# Patient Record
Sex: Male | Born: 1950 | Race: White | Hispanic: No | Marital: Married | Smoking: Former smoker
Health system: Southern US, Community
[De-identification: ages and names within clinical notes are randomized; demographics above are authoritative.]

## PROBLEM LIST (undated history)

## (undated) DIAGNOSIS — I10 Essential (primary) hypertension: Secondary | ICD-10-CM

## (undated) DIAGNOSIS — M1990 Unspecified osteoarthritis, unspecified site: Secondary | ICD-10-CM

## (undated) DIAGNOSIS — M109 Gout, unspecified: Secondary | ICD-10-CM

## (undated) DIAGNOSIS — E785 Hyperlipidemia, unspecified: Secondary | ICD-10-CM

## (undated) DIAGNOSIS — N189 Chronic kidney disease, unspecified: Secondary | ICD-10-CM

## (undated) DIAGNOSIS — E119 Type 2 diabetes mellitus without complications: Secondary | ICD-10-CM

## (undated) DIAGNOSIS — K439 Ventral hernia without obstruction or gangrene: Secondary | ICD-10-CM

## (undated) DIAGNOSIS — I509 Heart failure, unspecified: Secondary | ICD-10-CM

## (undated) DIAGNOSIS — G4730 Sleep apnea, unspecified: Secondary | ICD-10-CM

## (undated) DIAGNOSIS — I499 Cardiac arrhythmia, unspecified: Secondary | ICD-10-CM

## (undated) HISTORY — PX: CATARACT EXTRACTION W/ INTRAOCULAR LENS IMPLANT: SHX1309

## (undated) HISTORY — DX: Type 2 diabetes mellitus without complications: E11.9

## (undated) HISTORY — DX: Gout, unspecified: M10.9

## (undated) HISTORY — DX: Essential (primary) hypertension: I10

## (undated) HISTORY — DX: Ventral hernia without obstruction or gangrene: K43.9

## (undated) HISTORY — DX: Hyperlipidemia, unspecified: E78.5

## (undated) HISTORY — PX: HIP SURGERY: SHX245

## (undated) HISTORY — DX: Unspecified osteoarthritis, unspecified site: M19.90

---

## 1997-07-25 ENCOUNTER — Encounter: Admission: RE | Admit: 1997-07-25 | Discharge: 1997-07-25 | Payer: Self-pay | Admitting: *Deleted

## 2008-02-23 HISTORY — PX: KNEE ARTHROSCOPY: SHX127

## 2008-07-31 ENCOUNTER — Encounter: Admission: RE | Admit: 2008-07-31 | Discharge: 2008-07-31 | Payer: Self-pay | Admitting: Sports Medicine

## 2008-08-12 ENCOUNTER — Encounter: Admission: RE | Admit: 2008-08-12 | Discharge: 2008-08-12 | Payer: Self-pay | Admitting: Sports Medicine

## 2008-09-03 ENCOUNTER — Encounter: Admission: RE | Admit: 2008-09-03 | Discharge: 2008-10-22 | Payer: Self-pay | Admitting: Specialist

## 2009-02-22 HISTORY — PX: INGUINAL HERNIA REPAIR: SUR1180

## 2010-03-16 ENCOUNTER — Encounter: Payer: Self-pay | Admitting: Sports Medicine

## 2010-07-13 ENCOUNTER — Emergency Department (HOSPITAL_COMMUNITY)
Admission: EM | Admit: 2010-07-13 | Discharge: 2010-07-13 | Disposition: A | Discharge status: Home / Self Care | Payer: BC Managed Care – PPO | Attending: Emergency Medicine | Admitting: Emergency Medicine

## 2010-07-13 ENCOUNTER — Ambulatory Visit
Admission: RE | Admit: 2010-07-13 | Discharge: 2010-07-13 | Disposition: A | Discharge status: Home / Self Care | Payer: BC Managed Care – PPO | Source: Ambulatory Visit | Attending: Family Medicine | Admitting: Family Medicine

## 2010-07-13 ENCOUNTER — Other Ambulatory Visit: Payer: Self-pay | Admitting: Family Medicine

## 2010-07-13 DIAGNOSIS — I10 Essential (primary) hypertension: Secondary | ICD-10-CM | POA: Insufficient documentation

## 2010-07-13 DIAGNOSIS — R61 Generalized hyperhidrosis: Secondary | ICD-10-CM

## 2010-07-13 DIAGNOSIS — R109 Unspecified abdominal pain: Secondary | ICD-10-CM | POA: Insufficient documentation

## 2010-07-13 DIAGNOSIS — R110 Nausea: Secondary | ICD-10-CM

## 2010-07-13 DIAGNOSIS — N289 Disorder of kidney and ureter, unspecified: Secondary | ICD-10-CM | POA: Insufficient documentation

## 2010-07-13 DIAGNOSIS — R1031 Right lower quadrant pain: Secondary | ICD-10-CM

## 2010-07-13 DIAGNOSIS — N201 Calculus of ureter: Secondary | ICD-10-CM | POA: Insufficient documentation

## 2010-07-13 LAB — POCT I-STAT, CHEM 8
BUN: 29 mg/dL — ABNORMAL HIGH (ref 6–23)
Creatinine, Ser: 2.3 mg/dL — ABNORMAL HIGH (ref 0.4–1.5)
Hemoglobin: 15.6 g/dL (ref 13.0–17.0)
Potassium: 4.1 mEq/L (ref 3.5–5.1)
Sodium: 135 mEq/L (ref 135–145)

## 2010-07-13 LAB — URINALYSIS, ROUTINE W REFLEX MICROSCOPIC
Bilirubin Urine: NEGATIVE
Glucose, UA: NEGATIVE mg/dL
Ketones, ur: NEGATIVE mg/dL
Protein, ur: NEGATIVE mg/dL

## 2010-07-13 LAB — URINE MICROSCOPIC-ADD ON

## 2010-07-13 MED ORDER — IOHEXOL 300 MG/ML  SOLN
125.0000 mL | Freq: Once | INTRAMUSCULAR | Status: AC | PRN
  Administered 2010-07-13: 125 mL via INTRAVENOUS

## 2010-11-20 ENCOUNTER — Encounter: Payer: Self-pay | Admitting: Gastroenterology

## 2010-12-21 ENCOUNTER — Other Ambulatory Visit: Payer: BC Managed Care – PPO | Admitting: Internal Medicine

## 2010-12-24 ENCOUNTER — Other Ambulatory Visit: Payer: BC Managed Care – PPO | Admitting: Gastroenterology

## 2010-12-24 DIAGNOSIS — D126 Benign neoplasm of colon, unspecified: Secondary | ICD-10-CM

## 2010-12-24 HISTORY — DX: Benign neoplasm of colon, unspecified: D12.6

## 2010-12-25 ENCOUNTER — Ambulatory Visit (AMBULATORY_SURGERY_CENTER): Payer: BC Managed Care – PPO | Admitting: *Deleted

## 2010-12-25 ENCOUNTER — Other Ambulatory Visit: Payer: BC Managed Care – PPO | Admitting: Gastroenterology

## 2010-12-25 VITALS — Ht 72.0 in | Wt 255.0 lb

## 2010-12-25 DIAGNOSIS — Z1211 Encounter for screening for malignant neoplasm of colon: Secondary | ICD-10-CM

## 2010-12-25 MED ORDER — PEG-KCL-NACL-NASULF-NA ASC-C 100 G PO SOLR: ORAL | Status: DC

## 2011-01-08 ENCOUNTER — Ambulatory Visit (AMBULATORY_SURGERY_CENTER): Payer: BC Managed Care – PPO | Admitting: Gastroenterology

## 2011-01-08 ENCOUNTER — Encounter: Payer: Self-pay | Admitting: Gastroenterology

## 2011-01-08 DIAGNOSIS — D126 Benign neoplasm of colon, unspecified: Secondary | ICD-10-CM

## 2011-01-08 DIAGNOSIS — Z1211 Encounter for screening for malignant neoplasm of colon: Secondary | ICD-10-CM

## 2011-01-08 MED ORDER — SODIUM CHLORIDE 0.9 % IV SOLN: 500.0000 mL | INTRAVENOUS | Status: DC

## 2011-01-08 NOTE — Patient Instructions (Signed)
Please refer to your blue and neon green sheets for instructions regarding diet and activity for the rest of today.  You may resume your medications as you would normally take them.   You will receive a letter in the mail in about two weeks regarding the results of the biopsies taken today.  Hemorrhoids Hemorrhoids are veins in the rectum that get big. These veins can get blocked. Blocked veins become puffy (swollen) and painful. HOME CARE  Eat more fiber.   Drink enough fluid to keep your pee (urine) clear or pale yellow.   Exercise often.   Avoid straining to poop (bowel movement).   Keep the butt area dry and clean.   Only take medicine as told by your doctor.  If your hemorrhoids are puffy and painful:  Take a warm bath for 20 to 30 minutes. Do this 3 to 4 times a day.   Place ice packs on the area. Use the ice packs between the baths.   Put ice in a plastic bag.   Place a towel between your skin and the bag.   Leave the ice on for 15 to 20 minutes, 3 to 4 times a day.   Do not use a donut-shaped pillow. Do not sit on the toilet for a long time.   Go to the bathroom when your body has the urge to poop. This is so you do not strain as much to poop.  GET HELP RIGHT AWAY IF:   You have increasing pain that is not controlled with medicine.   You have uncontrolled bleeding.   You cannot poop.   You have pain or puffiness outside the area of the hemorrhoids.   You have chills.   You have a temperature by mouth above 102 F (38.9 C), not controlled by medicine.  MAKE SURE YOU:   Understand these instructions.   Will watch your condition.   Will get help right away if you are not doing well or get worse.  Document Released: 11/18/2007 Document Revised: 10/21/2010 Document Reviewed: 11/18/2007 [facility] Patient Information 2012 [place].   Colon Polyps A polyp is extra tissue that grows inside your body. Colon polyps grow in the large intestine. The  large intestine, also called the colon, is part of your digestive system. It is a long, hollow tube at the end of your digestive tract where your body makes and stores stool. Most polyps are not dangerous. They are benign. This means they are not cancerous. But over time, some types of polyps can turn into cancer. Polyps that are smaller than a pea are usually not harmful. But larger polyps could someday become or may already be cancerous. To be safe, doctors remove all polyps and test them.  WHO GETS POLYPS? Anyone can get polyps, but certain people are more likely than others. You may have a greater chance of getting polyps if:  You are over 50.   You have had polyps before.   Someone in your family has had polyps.   Someone in your family has had cancer of the large intestine.   Find out if someone in your family has had polyps. You may also be more likely to get polyps if you:   Eat a lot of fatty foods.   Smoke.   Drink alcohol.   Do not exercise.   Eat too much.  SYMPTOMS  Most small polyps do not cause symptoms. People often do not know they have one until their caregiver  finds it during a regular checkup or while testing them for something else. Some people do have symptoms like these:  Bleeding from the anus. You might notice blood on your underwear or on toilet paper after you have had a bowel movement.   Constipation or diarrhea that lasts more than a week.   Blood in the stool. Blood can make stool look black or it can show up as red streaks in the stool.  If you have any of these symptoms, see your caregiver. HOW DOES THE DOCTOR TEST FOR POLYPS? The doctor can use four tests to check for polyps:  Digital rectal exam. The caregiver wears gloves and checks your rectum (the last part of the large intestine) to see if it feels normal. This test would find polyps only in the rectum. Your caregiver may need to do one of the other tests listed below to find polyps higher up  in the intestine.   Barium enema. The caregiver puts a liquid called barium into your rectum before taking x-rays of your large intestine. Barium makes your intestine look white in the pictures. Polyps are dark, so they are easy to see.   Sigmoidoscopy. With this test, the caregiver can see inside your large intestine. A thin flexible tube is placed into your rectum. The device is called a sigmoidoscope, which has a light and a tiny video camera in it. The caregiver uses the sigmoidoscope to look at the last third of your large intestine.   Colonoscopy. This test is like sigmoidoscopy, but the caregiver looks at all of the large intestine. It usually requires sedation. This is the most common method for finding and removing polyps.  TREATMENT   The caregiver will remove the polyp during sigmoidoscopy or colonoscopy. The polyp is then tested for cancer.   If you have had polyps, your caregiver may want you to get tested regularly in the future.  PREVENTION  There is not one sure way to prevent polyps. You might be able to lower your risk of getting them if you:  Eat more fruits and vegetables and less fatty food.   Do not smoke.   Avoid alcohol.   Exercise every day.   Lose weight if you are overweight.   Eating more calcium and folate can also lower your risk of getting polyps. Some foods that are rich in calcium are milk, cheese, and broccoli. Some foods that are rich in folate are chickpeas, kidney beans, and spinach.   Aspirin might help prevent polyps. Studies are under way.  Document Released: 11/05/2003 Document Revised: 10/21/2010 Document Reviewed: 04/12/2007 [facility] Patient Information 2012 [place].

## 2011-01-11 ENCOUNTER — Telehealth: Payer: Self-pay | Admitting: *Deleted

## 2011-01-11 NOTE — Telephone Encounter (Signed)

## 2011-01-12 ENCOUNTER — Encounter: Payer: Self-pay | Admitting: Gastroenterology

## 2014-08-02 DIAGNOSIS — I10 Essential (primary) hypertension: Secondary | ICD-10-CM | POA: Insufficient documentation

## 2014-08-02 DIAGNOSIS — G4733 Obstructive sleep apnea (adult) (pediatric): Secondary | ICD-10-CM | POA: Diagnosis present

## 2014-08-02 DIAGNOSIS — E119 Type 2 diabetes mellitus without complications: Secondary | ICD-10-CM | POA: Insufficient documentation

## 2014-08-02 DIAGNOSIS — I4891 Unspecified atrial fibrillation: Secondary | ICD-10-CM | POA: Insufficient documentation

## 2014-08-02 DIAGNOSIS — E785 Hyperlipidemia, unspecified: Secondary | ICD-10-CM | POA: Insufficient documentation

## 2015-12-12 ENCOUNTER — Encounter: Payer: Self-pay | Admitting: Gastroenterology

## 2018-11-09 ENCOUNTER — Ambulatory Visit (INDEPENDENT_AMBULATORY_CARE_PROVIDER_SITE_OTHER): Payer: Commercial Managed Care - PPO | Admitting: Cardiology

## 2018-11-09 ENCOUNTER — Encounter: Payer: Self-pay | Admitting: Cardiology

## 2018-11-09 ENCOUNTER — Other Ambulatory Visit: Payer: Self-pay

## 2018-11-09 VITALS — BP 121/68 | HR 62 | Temp 97.5°F | Ht 69.0 in | Wt 243.0 lb

## 2018-11-09 DIAGNOSIS — E1122 Type 2 diabetes mellitus with diabetic chronic kidney disease: Secondary | ICD-10-CM | POA: Diagnosis not present

## 2018-11-09 DIAGNOSIS — Z8679 Personal history of other diseases of the circulatory system: Secondary | ICD-10-CM | POA: Diagnosis not present

## 2018-11-09 DIAGNOSIS — Z6835 Body mass index (BMI) 35.0-35.9, adult: Secondary | ICD-10-CM

## 2018-11-09 DIAGNOSIS — R9431 Abnormal electrocardiogram [ECG] [EKG]: Secondary | ICD-10-CM | POA: Diagnosis not present

## 2018-11-09 DIAGNOSIS — E782 Mixed hyperlipidemia: Secondary | ICD-10-CM

## 2018-11-09 DIAGNOSIS — R0989 Other specified symptoms and signs involving the circulatory and respiratory systems: Secondary | ICD-10-CM

## 2018-11-09 DIAGNOSIS — N183 Chronic kidney disease, stage 3 (moderate): Secondary | ICD-10-CM

## 2018-11-09 DIAGNOSIS — I10 Essential (primary) hypertension: Secondary | ICD-10-CM

## 2018-11-09 NOTE — Progress Notes (Signed)
Primary Physician/Referring:  Christain Sacramento, MD  Patient ID: LEW PROUT, male    DOB: 04-30-1950, 68 y.o.   MRN: [id number]  Chief Complaint  Patient presents with  . Atrial Fibrillation  . New Patient (Initial Visit)   HPI:    Dale [name]  is a 68 y.o. 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 Donnetta Hutching in [facility].    Patient was first diagnosed with atrial fibrillation on 11/23/2012.  He has not had any recurrence of atrial fibrillation since then and has not been on anticoagulation.  Was evaluated and started on CPAP and since then has not had recurrence of atrial fib.  Except for "arthritis" he denies any other specific symptoms of dyspnea, chest pain or palpitation.  States that he did have palpitations in 2014 when he was diagnosed with atrial fibrillation.  He admits to being markedly sedentary.  Past Medical History:  Diagnosis Date  . Arthritis   . Diabetes mellitus without complication ([place])   . Epigastric hernia   . Hyperlipidemia   . Hypertension    Past Surgical History:  Procedure Laterality Date  . CATARACT EXTRACTION W/ INTRAOCULAR LENS IMPLANT     right  . INGUINAL HERNIA REPAIR  2011   left  . KNEE ARTHROSCOPY  2010   left   Social History   Socioeconomic History  . Marital status: Married    Spouse name: Not on file  . Number of children: 2  . Years of education: Not on file  . Highest education level: Not on file  Occupational History  . Not on file  Social Needs  . Financial resource strain: Not on file  . Food insecurity    Worry: Not on file    Inability: Not on file  . Transportation needs    Medical: Not on file    Non-medical: Not on file  Tobacco Use  . Smoking status: Former Smoker     Years: 10.00    Types: Cigars, Cigarettes    Quit date: 2000    Years since quitting: 20.7  . Smokeless tobacco: Former Systems developer    Types: Chew    Quit date: 2000  . Tobacco comment: quite 1979 cigarettes  Substance and Sexual Activity  . Alcohol use: No  . Drug use: No  . Sexual activity: Not on file  Lifestyle  . Physical activity    Days per week: Not on file    Minutes per session: Not on file  . Stress: Not on file  Relationships  . Social Herbalist on phone: Not on file    Gets together: Not on file    Attends religious service: Not on file    Active member of club or organization: Not on file    Attends meetings of clubs or organizations: Not on file    Relationship status: Not on file  . Intimate partner violence    Fear of current or ex partner: Not on file    Emotionally abused: Not on file    Physically abused: Not on file    Forced sexual activity: Not on file  Other Topics Concern  . Not on file  Social History Narrative  . Not on file   ROS  Review of Systems  Constitution: Negative for chills, decreased appetite, malaise/fatigue and weight gain.  Cardiovascular: Negative for dyspnea on exertion, leg swelling and syncope.  Respiratory: Positive for snoring (on CPAP and compliant).   Endocrine: Negative for cold intolerance.  Hematologic/Lymphatic: Does not bruise/bleed easily.  Musculoskeletal: Positive for joint pain (hands and knee). Negative for joint swelling.  Gastrointestinal: Negative for abdominal pain, anorexia, change in bowel habit, hematochezia and melena.  Neurological: Negative for headaches and light-headedness.  Psychiatric/Behavioral: Negative for depression and substance abuse.  All other systems reviewed and are negative.  Objective   Vitals with BMI 11/09/2018 01/08/2011 12/25/2010  Height 5' 9"  6' 0"  6' 0"   Weight 243 lbs 255 lbs 255 lbs  BMI 35.87 92.3 30.0  Systolic 762 263 -  Diastolic 68 60 -  Pulse 62 67 -     Blood pressure 121/68, pulse 62, temperature (!) 97.5 F (36.4 C), height 5' 9"  (1.753 m), weight 243 lb (110.2 kg), SpO2 95 %. Body mass index is 35.88 kg/m.   Physical Exam  Constitutional:  Well built and moderately obese in no acute distress  HENT:  Head: Atraumatic.  Eyes: Conjunctivae are normal.  Neck: Neck supple. No JVD present. No thyromegaly present.  Cardiovascular: Normal rate, regular rhythm, normal heart sounds and intact distal pulses. Exam reveals no gallop.  No murmur heard. Pulmonary/Chest: Effort normal and breath sounds normal.  Abdominal: Soft. Bowel sounds are normal.  Obese abdomen  Musculoskeletal: Normal range of motion.  Neurological: He is alert.  Skin: Skin is warm and dry.  Psychiatric: He has a normal mood and affect.   Radiology: No results found.  Laboratory examination:    Hemoglobin A1CResulted: 10/10/2018 4:01 PM [facility] Component Name Value Ref Range  HEMOGLOBIN A1C 7.1 (H)     Comprehensive Metabolic PanelResulted: 3/35/4562 2:02 PM [facility] Component Name Value Ref Range  Sodium 139 135 - 146 MMOL/L  Potassium 4.2 3.5 - 5.3 MMOL/L  Chloride 108 98 - 110 MMOL/L  CO2 21 (L) 23 - 30 MMOL/L  BUN 29 (H) 8 - 24 MG/DL  Glucose 141 (H) 70 - 99 MG/DL  Creatinine 1.93 (H) 0.5 - 1.5 MG/DL  Calcium 9.3 8.5 - 10.5 MG/DL  Total Protein 7.4 6 - 8.3 G/DL  Albumin  4.0 3.5 - 5 G/DL  Total Bilirubin 0.8 0.1 - 1.2 MG/DL  Alkaline Phosphatase 92 25 - 125 IU/L or U/L  AST (SGOT) 40 5 - 40 IU/L or U/L  ALT (SGPT) 28 5 - 50 IU/L or U/L  Anion Gap 10 4 - 14 MMOL/L  Est. GFR Non-African American 35 (L)    Lipid ProfileResulted: 10/10/2018 2:02 PM [facility] Component Name Value Ref Range  LDL Direct 45 <130 mg/dL  Total Cholesterol 164 25 - 199 MG/DL  Triglycerides 687 (H) 10 - 150 MG/DL  HDL Cholesterol 18 (L) 35 - 135 MG/DL  Total Chol / HDL Cholesterol 9.1 (H) <4.5    Non-HDL Cholesterol 146  Comment:  TARGET: <(LDL-C TARGET + 30)MG/DL MG/DL   RBC 3.67 (L) 4.70 - 6.10 x 10*6/uL [facility]   Hemoglobin 12.2 (L) 14.0 - 18.0 G/DL [facility]   Hematocrit 36.5 (L) 42.0 - 52.0 % [facility]   MCV 99.5 (H) 80.0 - 94.0 FL [facility]   [facility]  33.1 (H) 27.0 - 31.0 PG [facility]   MCHC 33.3 33.0 - 37.0 G/DL [facility]   RDW 15.3 (H) 11.5 - 14.5 % [facility]   Platelets 76 (L) 160 - 360 X 10*3/uL     No results for input(s): NA, K, CL, CO2, GLUCOSE, BUN, CREATININE, CALCIUM, GFRNONAA, GFRAA in the last 8760 hours. CMP Latest Ref Rng & Units 07/13/2010  Glucose 70 - 99 mg/dL 117(H)  BUN 6 - 23 mg/dL 29(H)  Creatinine 0.4 - 1.5 mg/dL 2.30(H)  Sodium 135 - 145 mEq/L 135  Potassium 3.5 - 5.1 mEq/L 4.1  Chloride 96 - 112 mEq/L 103   CBC Latest Ref Rng & Units 07/13/2010  Hemoglobin 13.0 - 17.0 g/dL 15.6  Hematocrit 39.0 - 52.0 % 46.0   Lipid Panel  No results found for: CHOL, TRIG, HDL, CHOLHDL, VLDL, LDLCALC, LDLDIRECT HEMOGLOBIN A1C No results found for: HGBA1C, MPG TSH No results for input(s): TSH in the last 8760 hours. Medications   Prior to Admission medications   Medication Sig Start Date End Date Taking? Authorizing Provider  acetaminophen (TYLENOL) 500 MG tablet Take 500 mg by mouth every 12 (twelve) hours as needed.   Yes [provider]  aspirin 325 MG tablet Take 325 mg by mouth daily.     Yes [provider]  atenolol (TENORMIN) 50 MG tablet Take 50 mg by mouth daily.   Yes [provider]  atorvastatin (LIPITOR) 80 MG tablet Take 1 tablet by mouth daily. 10/12/18  Yes [provider]  colchicine-probenecid 0.5-500 MG per  tablet Take 1 tablet by mouth daily.     Yes [provider]  fenofibrate (TRICOR) 145 MG tablet Take 145 mg by mouth daily.     Yes [provider]  flecainide (TAMBOCOR) 50 MG tablet Take 2 tablets by mouth daily. 10/04/16  Yes [provider]  glimepiride (AMARYL) 2 MG tablet Take 1 tablet by mouth daily. 10/12/18  Yes [provider]  metFORMIN (GLUCOPHAGE-XR) 500 MG 24 hr tablet Take 2 tablets by mouth daily. 10/12/18  Yes [provider]  quinapril-hydrochlorothiazide (ACCURETIC) 20-12.5 MG per tablet Take 1 tablet by mouth daily.     Yes [provider]     Current Outpatient Medications  Medication Instructions  . acetaminophen (TYLENOL) 500 mg, Oral, Every 12 hours PRN  . aspirin 325 mg, Daily  . atenolol (TENORMIN) 50 mg, Oral, Daily  . atorvastatin (LIPITOR) 80 MG tablet 1 tablet, Oral, Daily  . colchicine-probenecid 0.5-500 MG per tablet 1 tablet, Daily  . fenofibrate (TRICOR) 145 mg, Daily  . flecainide (TAMBOCOR) 50 MG tablet 2 tablets, Oral, Daily  . glimepiride (AMARYL) 2 MG tablet 1 tablet, Oral, Daily  . metFORMIN (GLUCOPHAGE-XR) 500 MG 24 hr tablet 2 tablets, Oral, Daily  . quinapril-hydrochlorothiazide (ACCURETIC) 20-12.5 MG per tablet 1 tablet, Daily    Cardiac Studies:   None  Assessment     ICD-10-CM   1. Nonspecific abnormal electrocardiogram (ECG) (EKG)  R94.31 PCV ECHOCARDIOGRAM COMPLETE    PCV MYOCARDIAL PERFUSION WO LEXISCAN  2. History of atrial fibrillation  Z86.79 EKG 12-Lead    PCV ECHOCARDIOGRAM COMPLETE    PCV MYOCARDIAL PERFUSION WO LEXISCAN   CHA2DS2-VASc Score is 3.  Yearly risk of stroke: 3.2%.  (Age, HTN, DM).   3. Controlled type 2 diabetes mellitus with stage 3 chronic kidney disease, without long-term current  use of insulin (HCC)  E11.22    N18.3   4. Essential hypertension  I10 PCV ECHOCARDIOGRAM COMPLETE    PCV MYOCARDIAL PERFUSION WO LEXISCAN  5. Mixed hyperlipidemia  E78.2   6.  Right carotid bruit  R09.89 PCV CAROTID DUPLEX (BILATERAL)  7. Class 2 severe obesity due to excess calories with serious comorbidity and body mass index (BMI) of 35.0 to 35.9 in adult [facility])  E66.01    Z68.35     EKG 11/09/2018: Normal sinus rhythm at the rate of 63 bpm, left atrial enlargement, left axis deviation, left anterior fascicular block.  Poor R wave progression, cannot exclude anterior lateral infarct old, pulmonary disease pattern.  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  LVH with repolarization abnormality.  Abnormal EKG.  Recommendations:   Patient referred by Dr. Kathryne Eriksson for evaluation of atrial fibrillation, hypertension, hyperlipidemia and abnormal EKG.  Patient diagnosed with atrial fibrillation by Dr. Redmond Pulling in 2014 and was evaluated by Dr. Darral Dash cardiology.  In the interim he underwent sleep study which is severely abnormal and since then has been on CPAP and has not had any recurrence of atrial fibrillation.  Patient is presently on flecainide at fairly low dose of 50 mg b.i.d. along with aspirin only he is not on anticoagulation.  I discussed with the patient regarding the risks of recurrence of atrial fibrillation including but not limited to his age greater than 63, hypertension, obesity, sleep apnea.  However at this point it is extremely difficult for me to judge and state whether he should be on anticoagulation or not but guidelines do recommend that he be on anticoagulation.  He also has stage III chronic kidney disease.  I would have a very low threshold to start him on anticoagulation, patient is slightly hesitant to take the anticoagulants.   He does have right carotid bruit, abnormal EKG, he needs further cardiac testing.  I will perform carotid artery duplex, repeat echocardiogram as it was last done in 2018 and I do not have the results along with performing the treadmill exercise nuclear stress test in view of his cardiac risk factor being extremely high.  I will again  address anticoagulation issues with the patient at that time.  He does have mixed hyperlipidemia, this is being managed by Dr. Kathryne Eriksson.  I also reviewed his labs he has severe hypertrophic ischemia, mostly contributed by his diet.  I have discussed with him regarding making lifestyle changes.  I will address this on his next office visit.  This was a complex greater than 60 minute office visit with greater than 50% of the time spent face-to-face evaluation of his complex medical issues and evaluation of medical records.  Weight loss stressed again and gave positive reinforcement. [facility] diet (low glycemic) sheet given to the patient.  Discussed regarding DASH diet and salt restriction.   Adrian Prows, MD, [facility] 11/09/2018, 11:26 AM [facility] [place] Pager: [phone] Office: [phone] If no answer Cell [phone]

## 2018-11-21 ENCOUNTER — Ambulatory Visit (INDEPENDENT_AMBULATORY_CARE_PROVIDER_SITE_OTHER): Payer: Commercial Managed Care - PPO

## 2018-11-21 ENCOUNTER — Other Ambulatory Visit: Payer: Self-pay

## 2018-11-21 DIAGNOSIS — R0989 Other specified symptoms and signs involving the circulatory and respiratory systems: Secondary | ICD-10-CM | POA: Diagnosis not present

## 2018-11-21 DIAGNOSIS — R9431 Abnormal electrocardiogram [ECG] [EKG]: Secondary | ICD-10-CM

## 2018-11-21 DIAGNOSIS — Z8679 Personal history of other diseases of the circulatory system: Secondary | ICD-10-CM

## 2018-11-21 DIAGNOSIS — I10 Essential (primary) hypertension: Secondary | ICD-10-CM

## 2018-12-04 ENCOUNTER — Ambulatory Visit (INDEPENDENT_AMBULATORY_CARE_PROVIDER_SITE_OTHER): Payer: Commercial Managed Care - PPO

## 2018-12-04 ENCOUNTER — Other Ambulatory Visit: Payer: Self-pay

## 2018-12-04 DIAGNOSIS — I10 Essential (primary) hypertension: Secondary | ICD-10-CM

## 2018-12-04 DIAGNOSIS — R9431 Abnormal electrocardiogram [ECG] [EKG]: Secondary | ICD-10-CM | POA: Diagnosis not present

## 2018-12-04 DIAGNOSIS — Z8679 Personal history of other diseases of the circulatory system: Secondary | ICD-10-CM

## 2018-12-04 NOTE — Progress Notes (Signed)
LMOM with details

## 2018-12-04 NOTE — Progress Notes (Signed)
LMOM with results

## 2018-12-05 NOTE — Progress Notes (Signed)
Lvm to return calll

## 2018-12-05 NOTE — Progress Notes (Signed)
Called pt to inform him about his stress test. Pt undersood

## 2019-01-09 ENCOUNTER — Other Ambulatory Visit: Payer: Self-pay

## 2019-01-09 ENCOUNTER — Ambulatory Visit (INDEPENDENT_AMBULATORY_CARE_PROVIDER_SITE_OTHER): Payer: Commercial Managed Care - PPO | Admitting: Cardiology

## 2019-01-09 ENCOUNTER — Encounter: Payer: Self-pay | Admitting: Cardiology

## 2019-01-09 VITALS — BP 123/64 | HR 59 | Ht 70.0 in | Wt 241.0 lb

## 2019-01-09 DIAGNOSIS — I10 Essential (primary) hypertension: Secondary | ICD-10-CM

## 2019-01-09 DIAGNOSIS — Z8679 Personal history of other diseases of the circulatory system: Secondary | ICD-10-CM

## 2019-01-09 DIAGNOSIS — R9431 Abnormal electrocardiogram [ECG] [EKG]: Secondary | ICD-10-CM | POA: Diagnosis not present

## 2019-01-09 DIAGNOSIS — R0989 Other specified symptoms and signs involving the circulatory and respiratory systems: Secondary | ICD-10-CM | POA: Diagnosis not present

## 2019-01-09 DIAGNOSIS — Z9989 Dependence on other enabling machines and devices: Secondary | ICD-10-CM

## 2019-01-09 DIAGNOSIS — E66812 Obesity, class 2: Secondary | ICD-10-CM

## 2019-01-09 DIAGNOSIS — G4733 Obstructive sleep apnea (adult) (pediatric): Secondary | ICD-10-CM

## 2019-01-09 DIAGNOSIS — Z6835 Body mass index (BMI) 35.0-35.9, adult: Secondary | ICD-10-CM

## 2019-01-09 MED ORDER — ATENOLOL 50 MG PO TABS: 50.0000 mg | ORAL_TABLET | Freq: Every day | ORAL | 3 refills | Status: DC

## 2019-01-09 MED ORDER — FLECAINIDE ACETATE 50 MG PO TABS: 50.0000 mg | ORAL_TABLET | Freq: Two times a day (BID) | ORAL | 3 refills | Status: DC

## 2019-01-09 NOTE — Progress Notes (Signed)
Primary Physician/Referring:  Christain Sacramento, MD  Patient ID: Dale [name], male    DOB: August 17, 1950, 68 y.o.   MRN: [id number]  Chief Complaint  Patient presents with  . Atrial Fibrillation  . Hypertension   HPI:    Dale [name]  is a 68 y.o. 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 Donnetta Hutching in [facility].  He has been on flecainide 50 mg twice daily along with atenolol 50 mg daily and diagnosed with severe sleep apnea and has been compliant with CPAP and since then has not had any recurrence.  He is on aspirin and not on anticoagulation.  He established with me on 11/09/2018.  Except for "arthritis" he denies any other specific symptoms of dyspnea, chest pain or palpitation.  States that he did have palpitations in 2014 when he was diagnosed with atrial fibrillation.  He admits to being markedly sedentary.  Due to no prior cardiac arrest education and evaluation, underwent echocardiogram and stress test and for carotid bruit he underwent carotid duplex and presents for follow-up.  No new symptomatology.  He has been compliant with CPAP.  Past Medical History:  Diagnosis Date  . Arthritis   . Diabetes mellitus without complication ([place])   . Epigastric hernia   . Hyperlipidemia   . Hypertension    Past Surgical History:  Procedure Laterality Date  . CATARACT EXTRACTION W/ INTRAOCULAR LENS IMPLANT     right  . INGUINAL HERNIA REPAIR  2011   left  . KNEE ARTHROSCOPY  2010   left   Social History   Socioeconomic History  . Marital status: Married    Spouse name: Not on file  . Number of children: 2  . Years of education: Not on file  . Highest education level: Not on file  Occupational History  . Not on file  Social Needs  .  Financial resource strain: Not on file  . Food insecurity    Worry: Not on file    Inability: Not on file  . Transportation needs    Medical: Not on file    Non-medical: Not on file  Tobacco Use  . Smoking status: Former Smoker    Years: 10.00    Types: Cigars, Cigarettes    Quit date: 2000    Years since quitting: 20.8  . Smokeless tobacco: Former Systems developer    Types: Chew    Quit date: 2000  . Tobacco comment: quite 1979 cigarettes  Substance and Sexual Activity  . Alcohol use: No  . Drug use: No  . Sexual activity: Not on file  Lifestyle  . Physical activity    Days per week: Not on file    Minutes per session: Not on file  . Stress: Not on file  Relationships  . Social Herbalist on phone: Not on file    Gets together: Not on file    Attends religious service: Not on file    Active member of club or organization: Not on file    Attends meetings of clubs or organizations: Not on file    Relationship status: Not on file  . Intimate partner violence    Fear of current or ex partner: Not on file    Emotionally abused:  Not on file    Physically abused: Not on file    Forced sexual activity: Not on file  Other Topics Concern  . Not on file  Social History Narrative  . Not on file   ROS  Review of Systems  Constitution: Negative for chills, decreased appetite, malaise/fatigue and weight gain.  Cardiovascular: Negative for dyspnea on exertion, leg swelling and syncope.  Respiratory: Positive for snoring (on CPAP and compliant).   Endocrine: Negative for cold intolerance.  Hematologic/Lymphatic: Does not bruise/bleed easily.  Musculoskeletal: Positive for joint pain (hands and knee). Negative for joint swelling.  Gastrointestinal: Negative for abdominal pain, anorexia, change in bowel habit, hematochezia and melena.  Neurological: Negative for headaches and light-headedness.  Psychiatric/Behavioral: Negative for depression and substance abuse.  All other systems  reviewed and are negative.  Objective   Vitals with BMI 01/09/2019 11/09/2018 01/08/2011  Height 5' 10"  5' 9"  6' 0"   Weight 241 lbs 243 lbs 255 lbs  BMI 34.58 20.23 34.3  Systolic 568 616 837  Diastolic 64 68 60  Pulse 59 62 67    Blood pressure 123/64, pulse (!) 59, height 5' 10"  (1.778 m), weight 241 lb (109.3 kg), SpO2 97 %. Body mass index is 34.58 kg/m.   Physical Exam  Constitutional:  Well built and moderately obese in no acute distress  HENT:  Head: Atraumatic.  Eyes: Conjunctivae are normal.  Neck: Neck supple. No JVD present. No thyromegaly present.  Cardiovascular: Normal rate, regular rhythm, normal heart sounds and intact distal pulses. Exam reveals no gallop.  No murmur heard. Pulmonary/Chest: Effort normal and breath sounds normal.  Abdominal: Soft. Bowel sounds are normal.  Obese abdomen  Musculoskeletal: Normal range of motion.  Neurological: He is alert.  Skin: Skin is warm and dry.  Psychiatric: He has a normal mood and affect.   Radiology: No results found.  Laboratory examination:    Hemoglobin A1CResulted: 10/10/2018 4:01 PM [facility] Component Name Value Ref Range  HEMOGLOBIN A1C 7.1 (H)     Comprehensive Metabolic PanelResulted: 2/90/2111 2:02 PM [facility] Component Name Value Ref Range  Sodium 139 135 - 146 MMOL/L  Potassium 4.2 3.5 - 5.3 MMOL/L  Chloride 108 98 - 110 MMOL/L  CO2 21 (L) 23 - 30 MMOL/L  BUN 29 (H) 8 - 24 MG/DL  Glucose 141 (H) 70 - 99 MG/DL  Creatinine 1.93 (H) 0.5 - 1.5 MG/DL  Calcium 9.3 8.5 - 10.5 MG/DL  Total Protein 7.4 6 - 8.3 G/DL  Albumin  4.0 3.5 - 5 G/DL  Total Bilirubin 0.8 0.1 - 1.2 MG/DL  Alkaline Phosphatase 92 25 - 125 IU/L or U/L  AST (SGOT) 40 5 - 40 IU/L or U/L  ALT (SGPT) 28 5 - 50 IU/L or U/L  Anion Gap 10 4 - 14 MMOL/L  Est. GFR Non-African American 35 (L)    Lipid ProfileResulted: 10/10/2018 2:02 PM [facility] Component  Name Value Ref Range  LDL Direct 45 <130 mg/dL  Total Cholesterol 164 25 - 199 MG/DL  Triglycerides 687 (H) 10 - 150 MG/DL  HDL Cholesterol 18 (L) 35 - 135 MG/DL  Total Chol / HDL Cholesterol 9.1 (H) <4.5   Non-HDL Cholesterol 146  Comment:  TARGET: <(LDL-C TARGET + 30)MG/DL MG/DL   RBC 3.67 (L) 4.70 - 6.10 x 10*6/uL [facility]   Hemoglobin 12.2 (L) 14.0 - 18.0 G/DL Between [facility] [place]   Hematocrit 36.5 (L) 42.0 - 52.0 % [facility]   MCV 99.5 (H) 80.0 - 94.0 FL [facility]   MCH 33.1 (H) 27.0 - 31.0 PG [facility]   MCHC 33.3 33.0 - 37.0 G/DL [facility]   RDW 15.3 (H) 11.5 - 14.5 % [facility]   Platelets 76 (L) 160 - 360 X 10*3/uL     No results for input(s): NA, K, CL, CO2, GLUCOSE, BUN, CREATININE, CALCIUM, GFRNONAA, GFRAA in the last 8760 hours. CMP Latest Ref Rng & Units 07/13/2010  Glucose 70 - 99 mg/dL 117(H)  BUN 6 - 23 mg/dL 29(H)  Creatinine 0.4 - 1.5 mg/dL 2.30(H)  Sodium 135 - 145 mEq/L 135  Potassium 3.5 - 5.1 mEq/L 4.1  Chloride 96 - 112 mEq/L 103   CBC Latest Ref Rng & Units 07/13/2010  Hemoglobin 13.0 - 17.0 g/dL 15.6  Hematocrit 39.0 - 52.0 % 46.0   Lipid Panel  No results found for: CHOL, TRIG, HDL, CHOLHDL, VLDL, LDLCALC, LDLDIRECT HEMOGLOBIN A1C No results found for: HGBA1C, MPG TSH No results for input(s): TSH in the last 8760 hours. Medications   Prior to Admission medications   Medication Sig Start Date End Date Taking? Authorizing Provider  acetaminophen (TYLENOL) 500 MG tablet Take 500 mg by mouth every 12 (twelve) hours as needed.   Yes [provider]  aspirin 325 MG tablet Take 325 mg by mouth daily.     Yes [provider]  atenolol (TENORMIN) 50 MG  tablet Take 50 mg by mouth daily.   Yes [provider]  atorvastatin (LIPITOR) 80 MG tablet Take 1 tablet by mouth daily. 10/12/18  Yes [provider]  colchicine-probenecid 0.5-500 MG per tablet Take 1 tablet by mouth daily.     Yes [provider]  fenofibrate (TRICOR) 145 MG tablet Take 145 mg by mouth daily.     Yes [provider]  flecainide (TAMBOCOR) 50 MG tablet Take 2 tablets by mouth daily. 10/04/16  Yes [provider]  glimepiride (AMARYL) 2 MG tablet Take 1 tablet by mouth daily. 10/12/18  Yes [provider]  metFORMIN (GLUCOPHAGE-XR) 500 MG 24 hr tablet Take 2 tablets by mouth daily. 10/12/18  Yes [provider]  quinapril-hydrochlorothiazide (ACCURETIC) 20-12.5 MG per tablet Take 1 tablet by mouth daily.     Yes [provider]     Current Outpatient Medications  Medication Instructions  . acetaminophen (TYLENOL) 500 mg, Oral, Every 12 hours PRN  . aspirin 325 mg, Daily  . atenolol (TENORMIN) 50 mg, Oral, Daily  . atorvastatin (LIPITOR) 80 MG tablet 1 tablet, Oral, Daily  . colchicine-probenecid 0.5-500 MG per tablet 1 tablet, Daily  . fenofibrate (TRICOR) 145 mg, Daily  . flecainide (TAMBOCOR) 50 mg, Oral, 2 times daily  . glimepiride (AMARYL) 2 MG tablet 1 tablet, Oral, Daily  . metFORMIN (GLUCOPHAGE-XR) 500 MG 24 hr tablet 2 tablets, Oral, Daily  . quinapril-hydrochlorothiazide (ACCURETIC) 20-12.5 MG per tablet 1 tablet, Daily    Cardiac Studies:   Echocardiogram 11/21/2018: Left ventricle cavity is normal in size. Moderate concentric hypertrophy of the left ventricle. Normal LV systolic function with visual EF 50-55%. Normal global wall motion. Doppler evidence of grade I (impaired) diastolic dysfunction, normal LAP. Calculated EF 50%. Trileaflet aortic valve with mild aortic valve leaflet calcification.  no regurgitation noted. Mild (Grade I) mitral regurgitation. Inadequate TR jet to estimate  pulmonary artery systolic pressure. Normal right atrial pressure. No signifiant change compared to previous study in 2014.   Carotid artery duplex  11/21/2018: Minimal stenosis in the right internal carotid artery (minimal).  Stenosis in the right external carotid artery (<50%). Minimal stenosis in the left internal carotid artery (minimal).  Stenosis in the left external carotid artery (<50%). Antegrade right vertebral artery flow. Antegrade left vertebral artery flow. Bilateral external carotid stenosis is the source of bruit.  Follow up studies when clinically indicated.  Lexiscan Cardiolite stress test 12/04/2018: Lexiscan stress test was performed. Stress EKG is non-diagnostic, as this is pharmacological stress test. Stress EKG is non-diagnostic, as this is pharmacological stress test.  LV cavity is dilated at rest and stress. SPECT imaging show small sized mild intensity perfusion defect at both rest and stress images. STress LVEF 65%. Low risk stress test.   Assessment     ICD-10-CM   1. Essential hypertension  I10 EKG 12-Lead    atenolol (TENORMIN) 50 MG tablet  2. History of atrial fibrillation  Z86.79 flecainide (TAMBOCOR) 50 MG tablet    atenolol (TENORMIN) 50 MG tablet  3. Nonspecific abnormal electrocardiogram (ECG) (EKG)  R94.31   4. Right carotid bruit  R09.89   5. OSA on CPAP  G47.33    Z99.89    Compliant 100%  6. Class 2 severe obesity due to excess calories with serious comorbidity and body mass index (BMI) of 35.0 to 35.9 in adult [facility])  E66.01    Z68.35     EKG 01/09/2019: Sinus bradycardia at the rate of 57 bpm with first-degree AV block, left axis deviation, left anterior fascicular block.  Voltage criteria for LVH in the limb leads.  Nonspecific T abnormality. No significant change from  EKG 11/09/2018.  Recommendations:   RAIYAN DALESANDRO  is a 68 y.o. 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 Donnetta Hutching in [facility].  He has been on flecainide 50 mg twice daily along with atenolol 50 mg daily and diagnosed with severe sleep apnea and has been compliant with CPAP and since then has not had any recurrence.  He is on aspirin and not on anticoagulation.  He established with me on 11/09/2018.  I reviewed the results of the echocardiogram, fortunately the left atrial size is normal, with normal LVEF.  No evidence of ischemia by nuclear stress test.  He does have mild bilateral external carotid artery stenosis, recent for his carotid bruit.  Although his cardioembolic risk is high, patient is reluctant to being on anticoagulation.  States that he would continue present medications, I have given him literature/paper regarding symptoms and signs of atrial fibrillation and to call us anytime for evaluation.  I would have a very low threshold to start him on anticoagulation.  From 2014 through  11/09/2018 when I got involved, at least there is no documented A. fib.  Blood pressures well controlled, lipids are at goal and I discussed with him regarding weight loss again.  He has lost 4 pounds since last office visit.  He is now following [facility] diet with low glycemic index.  I will see him back in 6 months.  I have renewed his flecainide and also atenolol.  Adrian Prows, MD, [facility] 01/09/2019, 3:56 PM [facility]. [place] Pager: [phone] Office: [phone] If no answer Cell [phone]

## 2019-04-24 ENCOUNTER — Ambulatory Visit: Payer: PRIVATE HEALTH INSURANCE | Attending: Internal Medicine

## 2019-04-24 DIAGNOSIS — Z23 Encounter for immunization: Secondary | ICD-10-CM | POA: Insufficient documentation

## 2019-04-24 NOTE — Progress Notes (Signed)
   Covid-19 Vaccination Clinic  Name:  VYNCENT OVERBY    MRN: [id number] DOB: Aug 27, 1950  04/24/2019  Mr. Tapp was observed post Covid-19 immunization for 15 minutes without incident. He was provided with Vaccine Information Sheet and instruction to access the V-Safe system.   Mr. Rivero was instructed to call 911 with any severe reactions post vaccine: Marland Kitchen Difficulty breathing  . Swelling of face and throat  . A fast heartbeat  . A bad rash all over body  . Dizziness and weakness   Immunizations Administered    Name Date Dose VIS Date Route   Moderna COVID-19 Vaccine 04/24/2019  9:59 AM 0.5 mL 01/23/2019 Intramuscular   Manufacturer: Moderna   Lot: [id number]   [place]: [id number]

## 2019-05-22 ENCOUNTER — Ambulatory Visit: Payer: PRIVATE HEALTH INSURANCE | Attending: Internal Medicine

## 2019-05-22 DIAGNOSIS — Z23 Encounter for immunization: Secondary | ICD-10-CM

## 2019-05-22 NOTE — Progress Notes (Signed)
   Covid-19 Vaccination Clinic  Name:  Dale [name]    MRN: [id number] DOB: Nov 10, 1950  05/22/2019  Dale [name] was observed post Covid-19 immunization for 15 minutes without incident. He was provided with Vaccine Information Sheet and instruction to access the V-Safe system.   Dale [name] was instructed to call 911 with any severe reactions post vaccine: Marland Kitchen Difficulty breathing  . Swelling of face and throat  . A fast heartbeat  . A bad rash all over body  . Dizziness and weakness   Immunizations Administered    Name Date Dose VIS Date Route   Moderna COVID-19 Vaccine 05/22/2019  9:51 AM 0.5 mL 01/23/2019 Intramuscular   Manufacturer: Moderna   Lot: [id number]   [place]: [id number]

## 2019-07-09 ENCOUNTER — Ambulatory Visit: Payer: Commercial Managed Care - PPO | Admitting: Cardiology

## 2019-07-25 NOTE — Progress Notes (Signed)
Primary Physician/Referring:  Christain Sacramento, MD  Patient ID: Dale [name], male    DOB: 1950-05-15, 69 y.o.   MRN: [id number]  Chief Complaint  Patient presents with  . Follow-up  . Atrial Fibrillation  . Hyperlipidemia  . Hypertension   HPI:    Dale [name]  is a 69 y.o. 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 Donnetta Hutching in [facility].  He has been on flecainide 50 mg twice daily along with atenolol 50 mg daily and diagnosed with severe sleep apnea and has been compliant with CPAP and since then has not had any recurrence.  He is on aspirin and not on anticoagulation.  He established with me on 11/09/2018.  He is presently doing well, denies any palpitations, chest pain or dyspnea.  Since COVID-19, his activity level had decreased but now he is return back to work.  States that he is trying to make changes to his diet.  Except for "arthritis" he denies any other specific symptoms of dyspnea, chest pain or palpitation.    Past Medical History:  Diagnosis Date  . Arthritis   . Diabetes mellitus without complication ([place])   . Epigastric hernia   . Hyperlipidemia   . Hypertension    Past Surgical History:  Procedure Laterality Date  . CATARACT EXTRACTION W/ INTRAOCULAR LENS IMPLANT     right  . INGUINAL HERNIA REPAIR  2011   left  . KNEE ARTHROSCOPY  2010   left   Family History  Problem Relation Age of Onset  . Diabetes Mother   . Lung cancer Father     Social History   Tobacco Use  . Smoking status: Former Smoker    Years: 10.00    Types: Cigars, Cigarettes    Quit date: 2000    Years since quitting: 21.4  . Smokeless tobacco: Former Systems developer    Types: Chew    Quit date: 2000  . Tobacco comment: quite 1979 cigarettes   Substance Use Topics  . Alcohol use: No   Marital Status: Married  ROS  Review of Systems  Cardiovascular: Negative for dyspnea on exertion, leg swelling and syncope.  Respiratory: Positive for snoring (on CPAP and compliant).   Musculoskeletal: Positive for joint pain (hands and knee).  Gastrointestinal: Negative for melena.  All other systems reviewed and are negative.  Objective  Blood pressure 125/70, pulse 61, height 5' 10"  (1.778 m), weight 241 lb (109.3 kg).  Vitals with BMI 07/26/2019 01/09/2019 11/09/2018  Height 5' 10"  5' 10"  5' 9"   Weight 241 lbs 241 lbs 243 lbs  BMI 34.58 69.62 95.28  Systolic 413 244 010  Diastolic 70 64 68  Pulse 61 59 62     Physical Exam  Constitutional: He appears well-developed and well-nourished. No distress.  Well built and moderately obese in no acute distress  Cardiovascular: Normal rate, regular rhythm, normal heart sounds and intact distal pulses. Exam reveals no gallop.  No murmur heard. No leg edema, no JVD.   Pulmonary/Chest: Effort normal and breath sounds normal. No accessory muscle usage.  Abdominal: Soft. Bowel sounds are normal.  Obese abdomen   Laboratory examination:   External labs:   Lab 04/18/2019:  A1c 7.9%.  Sodium 139, potassium 4.2, BUN 33, creatinine  1.86, AST minimally elevated at 41, ALT 40 being normal.  Total cholesterol 161, triglycerides 06/22/1973, HDL 17, direct LDL 50.  Hb 12.1/HCT 36.5, platelets 86, WBC 4.7.  Hemoglobin A1C 10/10/2018 4:01 PM  [facility] Component Name Value Ref Range  HEMOGLOBIN A1C 7.1 (H)     Comprehensive Metabolic Panel 2/53/6644 0:34 PM [facility] Component Name Value Ref Range  Sodium 139 135 - 146 MMOL/L  Potassium 4.2 3.5 - 5.3 MMOL/L  Chloride 108 98 - 110 MMOL/L  CO2 21 (L) 23 - 30 MMOL/L  BUN 29 (H) 8 - 24 MG/DL  Glucose 141 (H) 70 - 99 MG/DL  Creatinine 1.93 (H) 0.5 - 1.5 MG/DL  Calcium 9.3 8.5 - 10.5 MG/DL   Total Protein 7.4 6 - 8.3 G/DL  Albumin  4.0 3.5 - 5 G/DL  Total Bilirubin 0.8 0.1 - 1.2 MG/DL  Alkaline Phosphatase 92 25 - 125 IU/L or U/L  AST (SGOT) 40 5 - 40 IU/L or U/L  ALT (SGPT) 28 5 - 50 IU/L or U/L  Anion Gap 10 4 - 14 MMOL/L  Est. GFR Non-African American 35 (L)    Lipid Profile 10/10/2018 2:02 PM [facility] Component Name Value Ref Range  LDL Direct 45 <130 mg/dL  Total Cholesterol 164 25 - 199 MG/DL  Triglycerides 687 (H) 10 - 150 MG/DL  HDL Cholesterol 18 (L) 35 - 135 MG/DL  Total Chol / HDL Cholesterol 9.1 (H) <4.5   Non-HDL Cholesterol 146  Comment:  TARGET: <(LDL-C TARGET + 30)MG/DL MG/DL   RBC 3.67 (L) 4.70 - 6.10 x 10*6/uL [facility]   Hemoglobin 12.2 (L) 14.0 - 18.0 G/DL [facility]   Hematocrit 36.5 (L) 42.0 - 52.0 % [facility]   MCV 99.5 (H) 80.0 - 94.0 FL [facility]   MCH 33.1 (H) 27.0 - 31.0 PG [facility]   MCHC 33.3 33.0 - 37.0 G/DL [facility]   RDW 15.3 (H) 11.5 - 14.5 % [facility]   Platelets 76 (L) 160 - 360 X 10*3/uL      Comprehensive Metabolic Panel 7/42/5956 3:87 PM [facility] Component Name Value Ref Range  Sodium 139 135 - 146 MMOL/L  Potassium 4.2 3.5 - 5.3 MMOL/L  Chloride 108 98 - 110 MMOL/L  CO2 23 23 - 30 MMOL/L  BUN 33 (H) 8 - 24 MG/DL  Glucose 118 (H)  Comment: Patients taking eltrombopag at doses >/= 100 mg daily may show falsely elevated values of 10% or greater. 70 - 99 MG/DL  Creatinine 1.86 (H) 0.50 - 1.50 MG/DL  Calcium 9.2 8.5 - 10.5 MG/DL  Total Protein 7.6  Comment: Patients taking eltrombopag at doses >/= 100 mg daily may show falsely elevated values of 10% or greater. 6.0 - 8.3 G/DL   Albumin  4.0 3.5 - 5.0 G/DL  Total Bilirubin 0.8  Comment: Patients taking eltrombopag at doses >/= 100 mg daily may show falsely elevated values of 10% or greater. 0.1 - 1.2 MG/DL  Alkaline Phosphatase 80 25 - 125 IU/L or U/L  AST (SGOT) 41 (H) 5 - 40 IU/L or U/L  ALT (SGPT) 27 5 - 50 IU/L or U/L  Anion Gap 9 4 - 14 MMOL/L  Est. GFR Non-African American 36 (L)  Comment: GFR estimated by CKD-EPI equations, reportable up to 90 ML/MIN/1.73 M*2 >=60 ML/MIN/1.73 M*2    Medications and allergies   Allergies  Allergen Reactions  . Allopurinol Other (See Comments)    Foot pain, not a rash., 19 Mar 2010     Current Outpatient Medications  Medication Instructions  . ACCU-CHEK GUIDE test strip No dose, route, or frequency recorded.  Marland Kitchen acetaminophen (TYLENOL) 500 mg, Oral, Every 12 hours PRN  . aspirin EC 81 mg, Oral, Daily  . atenolol (TENORMIN) 50 mg, Oral, Daily  . atorvastatin (LIPITOR) 80 MG tablet 1 tablet, Oral, Daily  . colchicine-probenecid 0.5-500 MG per tablet 1 tablet, Daily  . fenofibrate (TRICOR) 145 mg, Daily  . flecainide (TAMBOCOR) 50 mg, Oral, 2 times daily  . glimepiride (AMARYL) 2 MG tablet 1 tablet, Oral, Daily  . Lancets (ONETOUCH ULTRASOFT) lancets Test blood sugar twice a day and as needed  . metFORMIN (GLUCOPHAGE-XR) 500 MG 24 hr tablet 2 tablets, Oral, Daily  . quinapril-hydrochlorothiazide (ACCURETIC) 20-12.5 MG per tablet 1 tablet, Daily   Radiology:   No results found.  Cardiac Studies:   Echocardiogram 11/21/2018: Left ventricle cavity is normal in size. Moderate concentric hypertrophy of the left ventricle. Normal LV systolic function with visual EF 50-55%. Normal global wall motion. Doppler evidence of grade I (impaired) diastolic dysfunction, normal LAP. Calculated EF 50%. Trileaflet aortic valve with mild aortic valve leaflet calcification. no regurgitation noted. Mild (Grade I) mitral regurgitation. Inadequate TR jet to estimate pulmonary artery  systolic pressure. Normal right atrial pressure. No signifiant change compared to previous study in 2014.   Carotid artery duplex  11/21/2018: Minimal stenosis in the right internal carotid artery (minimal).  Stenosis in the right external carotid artery (<50%). Minimal stenosis in the left internal carotid artery (minimal).  Stenosis in the left external carotid artery (<50%). Antegrade right vertebral artery flow. Antegrade left vertebral artery flow. Bilateral external carotid stenosis is the source of bruit.  Follow up studies when clinically indicated.  Lexiscan Cardiolite stress test 12/04/2018: Lexiscan stress test was performed. Stress EKG is non-diagnostic, as this is pharmacological stress test. Stress EKG is non-diagnostic, as this is pharmacological stress test.  LV cavity is dilated at rest and stress. SPECT imaging show small sized mild intensity perfusion defect at both rest and stress images. STress LVEF 65%. Low risk stress test.   EKG   EKG 07/26/2019: Sinus rhythm with first-degree AV block at rate of 59 bpm, left atrial enlargement, left axis deviation, left anterior fascicular block.  LVH.  No significant change from 01/09/2019.   Assessment     ICD-10-CM   1. Essential hypertension  I10 EKG 12-Lead  2. History of atrial fibrillation  Z86.79   3. Right carotid bruit  R09.89   4. Mixed hyperlipidemia  E78.2      Outpatient Encounter Medications as of 07/26/2019  Medication Sig  . acetaminophen (TYLENOL) 500 MG tablet Take 500 mg by mouth every 12 (twelve) hours as needed.  Marland Kitchen aspirin EC 81 MG tablet Take 81 mg by mouth daily.  Marland Kitchen atenolol (TENORMIN) 50 MG tablet Take 1 tablet (50 mg total) by mouth daily.  Marland Kitchen atorvastatin (LIPITOR) 80 MG tablet Take 1 tablet by mouth daily.  . colchicine-probenecid 0.5-500 MG per tablet Take 1 tablet by mouth daily.    . fenofibrate (TRICOR) 145 MG tablet Take 145 mg by mouth daily.    . flecainide (TAMBOCOR) 50 MG tablet  Take 1  tablet (50 mg total) by mouth 2 (two) times daily.  Marland Kitchen glimepiride (AMARYL) 2 MG tablet Take 1 tablet by mouth daily.  . Lancets (ONETOUCH ULTRASOFT) lancets Test blood sugar twice a day and as needed  . metFORMIN (GLUCOPHAGE-XR) 500 MG 24 hr tablet Take 2 tablets by mouth daily.  . quinapril-hydrochlorothiazide (ACCURETIC) 20-12.5 MG per tablet Take 1 tablet by mouth daily.    . [DISCONTINUED] aspirin 325 MG tablet Take 325 mg by mouth daily.    Marland Kitchen ACCU-CHEK GUIDE test strip    No facility-administered encounter medications on file as of 07/26/2019.   Recommendations:   Dale [name]  is a 69 y.o. 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 Donnetta Hutching in [facility].  He has been on flecainide 50 mg twice daily along with atenolol 50 mg daily and diagnosed with severe sleep apnea and has been compliant with CPAP and since then has not had any recurrence.  He is on aspirin and not on anticoagulation.  He established with me on 11/09/2018.   He presently remains asymptomatic, no changes physical exam, the 32-monthoffice visit.  I am concerned about his continued elevation in triglycerides, related to his diet and lack of exercise.  He is strengthening the best he can with regard to making his changes to his diet and also exercise.  We have discussed avoiding the diet and given him ideas about avoiding high fat diet and also decreasing carbohydrate intake. Carotid duplex only shows external carotid stenosis.  Otherwise on appropriate medical therapy, he is on high-dose high intensity statins and also fenofibric acid.  Continue same.  Blood pressures well controlled, he has not had any recurrence of atrial fibrillation, I will see him back on annual basis going forward unless he has problems I  will see him back sooner. External labs reviewed.  JAdrian Prows MD, FNov[facility]6/04/2019, 2:27 PM PWal[facility] GroveCardiovascular. PA Pager: [phone] Office: [phone]

## 2019-07-26 ENCOUNTER — Other Ambulatory Visit: Payer: Self-pay

## 2019-07-26 ENCOUNTER — Ambulatory Visit: Payer: Commercial Managed Care - PPO | Admitting: Cardiology

## 2019-07-26 ENCOUNTER — Encounter: Payer: Self-pay | Admitting: Cardiology

## 2019-07-26 VITALS — BP 125/70 | HR 61 | Ht 70.0 in | Wt 241.0 lb

## 2019-07-26 DIAGNOSIS — E782 Mixed hyperlipidemia: Secondary | ICD-10-CM

## 2019-07-26 DIAGNOSIS — R0989 Other specified symptoms and signs involving the circulatory and respiratory systems: Secondary | ICD-10-CM

## 2019-07-26 DIAGNOSIS — I10 Essential (primary) hypertension: Secondary | ICD-10-CM

## 2019-07-26 DIAGNOSIS — Z8679 Personal history of other diseases of the circulatory system: Secondary | ICD-10-CM

## 2020-01-02 ENCOUNTER — Other Ambulatory Visit: Payer: Self-pay | Admitting: Cardiology

## 2020-01-02 DIAGNOSIS — Z8679 Personal history of other diseases of the circulatory system: Secondary | ICD-10-CM

## 2020-01-02 NOTE — Telephone Encounter (Signed)
Refill request

## 2020-02-13 ENCOUNTER — Other Ambulatory Visit: Payer: Self-pay | Admitting: Cardiology

## 2020-02-13 DIAGNOSIS — Z8679 Personal history of other diseases of the circulatory system: Secondary | ICD-10-CM

## 2020-02-13 DIAGNOSIS — I10 Essential (primary) hypertension: Secondary | ICD-10-CM

## 2020-07-25 ENCOUNTER — Ambulatory Visit: Payer: Commercial Managed Care - PPO | Admitting: Cardiology

## 2020-07-25 ENCOUNTER — Other Ambulatory Visit: Payer: Self-pay

## 2020-07-25 ENCOUNTER — Encounter: Payer: Self-pay | Admitting: Cardiology

## 2020-07-25 VITALS — BP 104/62 | HR 61 | Temp 98.6°F | Resp 16 | Ht 70.0 in | Wt 233.0 lb

## 2020-07-25 DIAGNOSIS — N1832 Chronic kidney disease, stage 3b: Secondary | ICD-10-CM

## 2020-07-25 DIAGNOSIS — R0600 Dyspnea, unspecified: Secondary | ICD-10-CM

## 2020-07-25 DIAGNOSIS — E1122 Type 2 diabetes mellitus with diabetic chronic kidney disease: Secondary | ICD-10-CM

## 2020-07-25 DIAGNOSIS — R0609 Other forms of dyspnea: Secondary | ICD-10-CM

## 2020-07-25 DIAGNOSIS — E782 Mixed hyperlipidemia: Secondary | ICD-10-CM

## 2020-07-25 DIAGNOSIS — E6609 Other obesity due to excess calories: Secondary | ICD-10-CM

## 2020-07-25 DIAGNOSIS — I10 Essential (primary) hypertension: Secondary | ICD-10-CM

## 2020-07-25 DIAGNOSIS — Z8679 Personal history of other diseases of the circulatory system: Secondary | ICD-10-CM

## 2020-07-25 MED ORDER — KERENDIA 10 MG PO TABS: 1.0000 | ORAL_TABLET | Freq: Every day | ORAL | 3 refills | Status: DC

## 2020-07-25 NOTE — Progress Notes (Signed)
Primary Physician/Referring:  Christain Sacramento, MD  Patient ID: [name], male    DOB: 1950/10/01, 70 y.o.   MRN: [id number]  Chief Complaint  Patient presents with  . Hypertension  . h/o afib    1 year  . Follow-up    1 year   HPI:    ERIQUE [name]  is a 70 y.o. 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 Donnetta Hutching in [facility].  He has been on flecainide 50 mg twice daily along with atenolol 50 mg daily and diagnosed with severe sleep apnea and has been compliant with CPAP and since then has not had any recurrence.  He is on aspirin and not on anticoagulation.  He established with me on 11/09/2018.  He is presently doing well, denies any palpitations, chest pain or dyspnea.  Except for "arthritis" he denies any other specific symptoms of dyspnea, chest pain or palpitation.    Past Medical History:  Diagnosis Date  . Arthritis   . Diabetes mellitus without complication ([place])   . Epigastric hernia   . Hyperlipidemia   . Hypertension    Past Surgical History:  Procedure Laterality Date  . CATARACT EXTRACTION W/ INTRAOCULAR LENS IMPLANT     right  . INGUINAL HERNIA REPAIR  2011   left  . KNEE ARTHROSCOPY  2010   left   Family History  Problem Relation Age of Onset  . Diabetes Mother   . Lung cancer Father     Social History   Tobacco Use  . Smoking status: Former Smoker    Years: 10.00    Types: Cigars, Cigarettes    Quit date: 2000    Years since quitting: 22.4  . Smokeless tobacco: Former Systems developer    Types: Chew    Quit date: 2000  . Tobacco comment: quite 1979 cigarettes  Substance Use Topics  . Alcohol use: No   Marital Status: Married  ROS  Review of Systems  Constitutional: Positive for weight loss  (intentional).  Cardiovascular: Positive for dyspnea on exertion and leg swelling. Negative for syncope.  Respiratory: Positive for snoring (on CPAP and compliant).   Musculoskeletal: Positive for joint pain (hands and knee).  Gastrointestinal: Positive for bloating and change in bowel habit. Negative for melena.  All other systems reviewed and are negative.  Objective  Blood pressure 104/62, pulse 61, temperature 98.6 F (37 C), temperature source Temporal, resp. rate 16, height 5' 10"  (1.778 m), weight 233 lb (105.7 kg), SpO2 98 %.  Vitals with BMI 07/25/2020 07/26/2019 01/09/2019  Height 5' 10"  5' 10"  5' 10"   Weight 233 lbs 241 lbs 241 lbs  BMI 33.43 73.42 87.68  Systolic 115 726 203  Diastolic 62 70 64  Pulse 61 61 59     Physical Exam Constitutional:      General: He is not in acute distress.    Appearance: He is well-developed.     Comments: Well built and moderately obese in no acute distress  Neck:     Vascular: No carotid bruit or JVD.  Cardiovascular:     Rate and Rhythm: Normal rate and regular rhythm.     Pulses: Normal pulses and intact distal pulses.     Heart sounds: Normal heart sounds. No  murmur heard. No gallop.   Pulmonary:     Effort: Pulmonary effort is normal. No accessory muscle usage.     Breath sounds: Normal breath sounds.  Abdominal:     General: Bowel sounds are normal.     Palpations: Abdomen is soft.     Comments: Obese abdomen  Musculoskeletal:        General: Swelling (2+ bilateral pitting ankle edema) present.    Laboratory examination:   External labs:   Labs 03/18/2020:  Hb 11.9/HCT 36.2, platelets 66.  Microcytic indicis.  Sodium 140, potassium 4.1, BUN 39, creatinine 2.06, EGFR 34 mL.  Serum glucose 132.  AST elevated at 73, ALT normal at 39.  Total cholesterol 134, triglycerides 394, HDL 18, LDL 37 (direct).  Non-HDL cholesterol 116.  A1c 7.7%.  PSA normal, uric acid normal.  Lab 04/18/2019:  A1c 7.9%.  Sodium 139, potassium  4.2, BUN 33, creatinine 1.86, AST minimally elevated at 41, ALT 40 being normal.  Total cholesterol 161, triglycerides 06/22/1973, HDL 17, direct LDL 50.  Hb 12.1/HCT 36.5, platelets 86, WBC 4.7.  Hemoglobin A1C 10/10/2018 4:01 PM   Medications and allergies   Allergies  Allergen Reactions  . Allopurinol Other (See Comments)    Foot pain, not a rash., 19 Mar 2010     Current Outpatient Medications  Medication Instructions  . ACCU-CHEK GUIDE test strip No dose, route, or frequency recorded.  Marland Kitchen acetaminophen (TYLENOL) 500 mg, Oral, Every 12 hours PRN  . aspirin EC 81 mg, Oral, Daily  . atenolol (TENORMIN) 50 MG tablet TAKE 1 TABLET BY MOUTH EVERY DAY  . atorvastatin (LIPITOR) 80 MG tablet 1 tablet, Oral, Daily  . colchicine-probenecid 0.5-500 MG per tablet 1 tablet, Oral, Daily,    . fenofibrate (TRICOR) 145 mg, Oral, Daily,    . Finerenone (KERENDIA) 10 MG TABS 1 tablet, Oral, Daily  . flecainide (TAMBOCOR) 50 MG tablet TAKE 1 TABLET BY MOUTH TWICE A DAY  . Lancets (ONETOUCH ULTRASOFT) lancets Test blood sugar twice a day and as needed  . metFORMIN (GLUCOPHAGE-XR) 500 MG 24 hr tablet 2 tablets, Oral, Daily  . pioglitazone (ACTOS) 15 mg, Oral, Daily  . quinapril-hydrochlorothiazide (ACCURETIC) 20-12.5 MG per tablet 1 tablet, Oral, Daily,     Medications after today's encounter Current Outpatient Medications  Medication Instructions  . ACCU-CHEK GUIDE test strip No dose, route, or frequency recorded.  Marland Kitchen acetaminophen (TYLENOL) 500 mg, Oral, Every 12 hours PRN  . aspirin EC 81 mg, Oral, Daily  . atenolol (TENORMIN) 50 MG tablet TAKE 1 TABLET BY MOUTH EVERY DAY  . atorvastatin (LIPITOR) 80 MG tablet 1 tablet, Oral, Daily  . colchicine-probenecid 0.5-500 MG per tablet 1 tablet, Oral, Daily,    . fenofibrate (TRICOR) 145 mg, Oral, Daily,    . Finerenone (KERENDIA) 10 MG TABS 1 tablet, Oral, Daily  . flecainide (TAMBOCOR) 50 MG tablet TAKE 1 TABLET BY MOUTH TWICE A DAY  . Lancets  (ONETOUCH ULTRASOFT) lancets Test blood sugar twice a day and as needed  . metFORMIN (GLUCOPHAGE-XR) 500 MG 24 hr tablet 2 tablets, Oral, Daily  . pioglitazone (ACTOS) 15 mg, Oral, Daily  . quinapril-hydrochlorothiazide (ACCURETIC) 20-12.5 MG per tablet 1 tablet, Oral, Daily,      Radiology:   No results found.  Cardiac Studies:   Echocardiogram 11/21/2018: Left ventricle cavity is normal in size. Moderate concentric hypertrophy of the left ventricle. Normal LV systolic function with visual EF 50-55%. Normal global wall motion. Doppler evidence of grade  I (impaired) diastolic dysfunction, normal LAP. Calculated EF 50%. Trileaflet aortic valve with mild aortic valve leaflet calcification. no regurgitation noted. Mild (Grade I) mitral regurgitation. Inadequate TR jet to estimate pulmonary artery systolic pressure. Normal right atrial pressure. No signifiant change compared to previous study in 2014.   Carotid artery duplex  11/21/2018: Minimal stenosis in the right internal carotid artery (minimal).  Stenosis in the right external carotid artery (<50%). Minimal stenosis in the left internal carotid artery (minimal).  Stenosis in the left external carotid artery (<50%). Antegrade right vertebral artery flow. Antegrade left vertebral artery flow. Bilateral external carotid stenosis is the source of bruit.  Follow up studies when clinically indicated.  Lexiscan Cardiolite stress test 12/04/2018: Lexiscan stress test was performed. Stress EKG is non-diagnostic, as this is pharmacological stress test. Stress EKG is non-diagnostic, as this is pharmacological stress test.  LV cavity is dilated at rest and stress. SPECT imaging show small sized mild intensity perfusion defect at both rest and stress images. STress LVEF 65%. Low risk stress test.   EKG  EKG 07/25/2020: Sinus rhythm with first-degree AV block at rate of 54 bpm, left atrial enlargement, left axis deviation, left anterior  fascicular block.  IVCD, LVH.  No significant change from 07/26/2019.   Assessment     ICD-10-CM   1. Dyspnea on exertion  R06.00 Brain natriuretic peptide    Brain natriuretic peptide    PCV ECHOCARDIOGRAM COMPLETE  2. History of atrial fibrillation  Z86.79 PCV ECHOCARDIOGRAM COMPLETE  3. Essential hypertension  I10 EKG 12-Lead  4. Mixed hyperlipidemia  E78.2 LDL cholesterol, direct    Lipid Panel With LDL/HDL Ratio  5. Class 1 obesity due to excess calories with serious comorbidity and body mass index (BMI) of 33.0 to 33.9 in adult  E66.09    Z68.33   6. Type 2 diabetes mellitus with stage 3b chronic kidney disease, without long-term current use of insulin (HCC)  E11.22 Finerenone (KERENDIA) 10 MG TABS   Q68.34 Basic metabolic panel    Basic metabolic panel    Hgb H9Q w/o eAG    Hgb A1c w/o eAG    Meds ordered this encounter  Medications  . Finerenone (KERENDIA) 10 MG TABS    Sig: Take 1 tablet by mouth daily.    Dispense:  90 tablet    Refill:  3    Recommendations:   JAXEN SAMPLES  is a 70 y.o. 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 Donnetta Hutching in [facility].  He has been on flecainide 50 mg twice daily along with atenolol 50 mg daily and diagnosed with severe sleep apnea and has been compliant with CPAP and since then has not had any recurrence.  He is on aspirin and not on anticoagulation.  He established with me on 11/09/2018.  This is annual visit, he has developed mild dyspnea on exertion and also mild bilateral ankle edema.  He has also made lifestyle changes since recent evaluation by Dr. Kathryne Eriksson where he was told his diabetes is out of control.  He has lost about 15 pounds in weight, he is determined to make changes in  his lifestyle.  I am concerned about his stage IV chronic kidney disease, uncontrolled diabetes and uncontrolled lipids.  In view  of stage IIIb chronic kidney disease, Kerendia 10 mg daily would be appropriate.  He needs labs in a month including lipids, BMP, I will also obtain A1c and forwarded to Dr. Redmond Pulling.  With regard to atrial fibrillation, he maintained sinus rhythm.  He has been chronically on flecainide, no change in the EKG, I left it alone for now but could consider discontinuing this once he achieves weight of <BMI 30 at least.  He has been compliant with his CPAP.  Blood pressure is controlled, due to dyspnea I will obtain a BMP and will also repeat an echocardiogram and have like to see him back in 6 weeks.  Hopefully his insurance will cover [place].  This was a 40-minute office visit encounter with evaluation of external records, management of complex multisystem disease.   Adrian Prows, MD, [facility] 07/25/2020, 12:01 PM Office: [phone] Fax: [phone] Pager: [phone]

## 2020-07-30 ENCOUNTER — Encounter: Payer: Self-pay | Admitting: Cardiology

## 2020-08-15 ENCOUNTER — Ambulatory Visit: Payer: Commercial Managed Care - PPO

## 2020-08-15 ENCOUNTER — Other Ambulatory Visit: Payer: Self-pay

## 2020-08-15 DIAGNOSIS — R0600 Dyspnea, unspecified: Secondary | ICD-10-CM

## 2020-08-15 DIAGNOSIS — Z8679 Personal history of other diseases of the circulatory system: Secondary | ICD-10-CM

## 2020-08-15 DIAGNOSIS — R0609 Other forms of dyspnea: Secondary | ICD-10-CM

## 2020-08-21 LAB — HGB A1C W/O EAG: Hgb A1c MFr Bld: 5.8 % — ABNORMAL HIGH (ref 4.8–5.6)

## 2020-08-21 LAB — BASIC METABOLIC PANEL
BUN/Creatinine Ratio: 20 (ref 10–24)
BUN: 39 mg/dL — ABNORMAL HIGH (ref 8–27)
CO2: 17 mmol/L — ABNORMAL LOW (ref 20–29)
Calcium: 9.2 mg/dL (ref 8.6–10.2)
Chloride: 105 mmol/L (ref 96–106)
Creatinine, Ser: 1.98 mg/dL — ABNORMAL HIGH (ref 0.76–1.27)
Glucose: 84 mg/dL (ref 65–99)
Potassium: 4.7 mmol/L (ref 3.5–5.2)
Sodium: 138 mmol/L (ref 134–144)
eGFR: 36 mL/min/{1.73_m2} — ABNORMAL LOW (ref >59)

## 2020-08-21 LAB — BRAIN NATRIURETIC PEPTIDE: BNP: 252.2 pg/mL — ABNORMAL HIGH (ref 0.0–100.0)

## 2020-08-21 NOTE — Progress Notes (Signed)
BNP is elevated suggest acute on chronic diastolic heart failure most probably.  Will discuss on office visit.

## 2020-08-21 NOTE — Progress Notes (Signed)
A1c is in prediabetic range.  Renal function has remained stable on [place] labs normal serum creatinine on 03/18/2020 was 2.06. Will forward a copy to PCP, Patient on office visit.  Will discuss with

## 2020-08-21 NOTE — Progress Notes (Signed)
Echocardiogram 08/15/2020: Left ventricle cavity is normal in size. Mild concentric hypertrophy of the left ventricle. Mildly depressed LV systolic function with EF 45-50%. Normal global wall motion. Doppler evidence of grade II (pseudonormal) diastolic dysfunction, elevated LAP. Calculated EF 50%. Left atrial cavity is mildly dilated. Trileaflet aortic valve.  Trace aortic stenosis and regurgitation. Moderate (Grade II) mitral regurgitation. Mild tricuspid regurgitation. Estimated pulmonary artery systolic pressure 24 mmHg. Previous study on 11/21/2018 noted EF 50-55%, grade 1 DD, grade I MR, inadequate TR jet to calculated PASP.  Will discuss on OV soon

## 2020-09-05 ENCOUNTER — Other Ambulatory Visit: Payer: Self-pay

## 2020-09-05 ENCOUNTER — Encounter: Payer: Self-pay | Admitting: Cardiology

## 2020-09-05 ENCOUNTER — Ambulatory Visit: Payer: Commercial Managed Care - PPO | Admitting: Cardiology

## 2020-09-05 VITALS — BP 100/62 | HR 58 | Temp 98.7°F | Resp 17 | Ht 70.0 in | Wt 230.4 lb

## 2020-09-05 DIAGNOSIS — I5032 Chronic diastolic (congestive) heart failure: Secondary | ICD-10-CM

## 2020-09-05 DIAGNOSIS — G4733 Obstructive sleep apnea (adult) (pediatric): Secondary | ICD-10-CM

## 2020-09-05 DIAGNOSIS — R0600 Dyspnea, unspecified: Secondary | ICD-10-CM

## 2020-09-05 DIAGNOSIS — N1832 Chronic kidney disease, stage 3b: Secondary | ICD-10-CM

## 2020-09-05 DIAGNOSIS — R0609 Other forms of dyspnea: Secondary | ICD-10-CM

## 2020-09-05 DIAGNOSIS — I10 Essential (primary) hypertension: Secondary | ICD-10-CM

## 2020-09-05 MED ORDER — ASPIRIN 81 MG PO CHEW: 81.0000 mg | CHEWABLE_TABLET | Freq: Every day | ORAL | Status: DC

## 2020-09-05 NOTE — Progress Notes (Signed)
Primary Physician/Referring:  Christain Sacramento, MD  Patient ID: Dale Dale [name], male    DOB: 1950/07/24, 70 y.o.   MRN: [id number]  Chief Complaint  Patient presents with   Atrial Fibrillation   Hypertension    6 WEEKS   HPI:    Dale Dale [name]  is a 70 y.o. 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 Donnetta Hutching in [facility].  He has been on flecainide 50 mg twice daily along with atenolol 50 mg daily and diagnosed with severe sleep apnea and has been compliant with CPAP and since then has not had any recurrence.  He is on aspirin and not on anticoagulation.  He established with me on 11/09/2018.  He is presently doing well, denies any palpitations, chest pain or dyspnea.  Except for "arthritis" he denies any other specific symptoms of dyspnea, chest pain or palpitation.  Since he started seeing [place], has made lifestyle changes and is loosing weight.   Past Medical History:  Diagnosis Date   Arthritis    Diabetes mellitus without complication ([place])    Epigastric hernia    Hyperlipidemia    Hypertension    Past Surgical History:  Procedure Laterality Date   CATARACT EXTRACTION W/ INTRAOCULAR LENS IMPLANT     right   INGUINAL HERNIA REPAIR  2011   left   KNEE ARTHROSCOPY  2010   left   Family History  Problem Relation Age of Onset   Diabetes Mother    Lung cancer Father     Social History   Tobacco Use   Smoking status: Former    Years: 10.00    Types: Cigars, Cigarettes    Quit date: 2000    Years since quitting: 22.5   Smokeless tobacco: Former    Types: Chew    Quit date: 2000   Tobacco comments:    quite 1979 cigarettes  Substance Use Topics   Alcohol use: No   Marital Status: Married  ROS  Review of Systems   Constitutional: Negative for weight loss.  Cardiovascular:  Positive for dyspnea on exertion and leg swelling. Negative for syncope.  Respiratory:  Positive for snoring (on CPAP and compliant).   Musculoskeletal:  Positive for joint pain (Dale [name] and knee).  Gastrointestinal:  Positive for bloating and change in bowel habit. Negative for melena.  All other systems reviewed and are negative. Objective  Blood pressure 100/62, pulse (!) 58, temperature 98.7 F (37.1 C), temperature source Temporal, resp. rate 17, height 5' 10"  (1.778 m), weight 230 lb 6.4 oz (104.5 kg), SpO2 98 %.  Vitals with BMI 09/05/2020 07/25/2020 07/26/2019  Height 5' 10"  5' 10"  5' 10"   Weight 230 lbs 6 oz 233 lbs 241 lbs  BMI 33.06 62.13 08.65  Systolic 784 696 295  Diastolic 62 62 70  Pulse 58 61 61     Physical Exam Constitutional:      General: He is not in acute distress.    Appearance: He is well-developed.     Comments: Well built and moderately obese in no acute distress  Neck:     Vascular: No carotid bruit or JVD.  Cardiovascular:     Rate and Rhythm: Normal rate and regular rhythm.     Pulses: Normal pulses and  intact distal pulses.     Heart sounds: Normal heart sounds. No murmur heard.   No gallop.  Pulmonary:     Effort: Pulmonary effort is normal. No accessory muscle usage.     Breath sounds: Normal breath sounds.  Abdominal:     General: Bowel sounds are normal.     Palpations: Abdomen is soft.     Comments: Obese abdomen  Musculoskeletal:        General: No swelling.   Laboratory examination:   CMP Latest Ref Rng & Units 08/20/2020 07/13/2010  Glucose 65 - 99 mg/dL 84 117(H)  BUN 8 - 27 mg/dL 39(H) 29(H)  Creatinine 0.76 - 1.27 mg/dL 1.98(H) 2.30(H)  Sodium 134 - 144 mmol/L 138 135  Potassium 3.5 - 5.2 mmol/L 4.7 4.1  Chloride 96 - 106 mmol/L 105 103  CO2 20 - 29 mmol/L 17(L) -  Calcium 8.6 - 10.2 mg/dL 9.2 -   CBC Latest Ref Rng & Units 07/13/2010  Hemoglobin 13.0 - 17.0 g/dL 15.6   Hematocrit 39.0 - 52.0 % 46.0   Lipid Panel  No results found for: CHOL, TRIG, HDL, CHOLHDL, VLDL, LDLCALC, LDLDIRECT HEMOGLOBIN A1C Lab Results  Component Value Date   HGBA1C 5.8 (H) 08/20/2020   BNP (last 3 results) Recent Labs    08/20/20 1111  BNP 252.2*   External labs:   Labs 03/18/2020:  Hb 11.9/HCT 36.2, platelets 66.  Microcytic indicis.  Sodium 140, potassium 4.1, BUN 39, creatinine 2.06, EGFR 34 mL.  Serum glucose 132.  AST elevated at 73, ALT normal at 39.  Total cholesterol 134, triglycerides 394, HDL 18, LDL 37 (direct).  Non-HDL cholesterol 116.  A1c 7.7%.  PSA normal, uric acid normal.  Lab 04/18/2019:  A1c 7.9%.  Sodium 139, potassium 4.2, BUN 33, creatinine 1.86, AST minimally elevated at 41, ALT 40 being normal.  Total cholesterol 161, triglycerides 06/22/1973, HDL 17, direct LDL 50.  Hb 12.1/HCT 36.5, platelets 86, WBC 4.7.  Hemoglobin A1C 10/10/2018 4:01 PM   Medications and allergies   Allergies  Allergen Reactions   Allopurinol Other (See Comments)    Foot pain, not a rash., 19 Mar 2010    Current Outpatient Medications  Medication Instructions   ACCU-CHEK GUIDE test strip No dose, route, or frequency recorded.   acetaminophen (TYLENOL) 500 mg, Oral, Every 12 hours PRN   aspirin (ASPIRIN CHILDRENS) 81 mg, Oral, Daily   atenolol (TENORMIN) 50 MG tablet TAKE 1 TABLET BY MOUTH EVERY DAY   atorvastatin (LIPITOR) 80 MG tablet 1 tablet, Oral, Daily   colchicine-probenecid 0.5-500 MG per tablet 1 tablet, Oral, Daily,     fenofibrate (TRICOR) 145 mg, Oral, Daily,     Finerenone (KERENDIA) 10 MG TABS 1 tablet, Oral, Daily   flecainide (TAMBOCOR) 50 MG tablet TAKE 1 TABLET BY MOUTH TWICE A DAY   Lancets (ONETOUCH ULTRASOFT) lancets Test blood sugar twice a day and as needed   metFORMIN (GLUCOPHAGE-XR) 500 MG 24 hr tablet 2 tablets, Oral, Daily   pioglitazone (ACTOS) 15 mg, Oral, Daily   quinapril-hydrochlorothiazide (ACCURETIC) 20-12.5 MG per  tablet 1 tablet, Oral, Daily,     Medications after today's encounter Current Outpatient Medications  Medication Instructions   ACCU-CHEK GUIDE test strip No dose, route, or frequency recorded.   acetaminophen (TYLENOL) 500 mg, Oral, Every 12 hours PRN   aspirin (ASPIRIN CHILDRENS) 81 mg, Oral, Daily   atenolol (TENORMIN) 50 MG tablet TAKE 1 TABLET BY MOUTH EVERY DAY   atorvastatin (LIPITOR)  80 MG tablet 1 tablet, Oral, Daily   colchicine-probenecid 0.5-500 MG per tablet 1 tablet, Oral, Daily,     fenofibrate (TRICOR) 145 mg, Oral, Daily,     Finerenone (KERENDIA) 10 MG TABS 1 tablet, Oral, Daily   flecainide (TAMBOCOR) 50 MG tablet TAKE 1 TABLET BY MOUTH TWICE A DAY   Lancets (ONETOUCH ULTRASOFT) lancets Test blood sugar twice a day and as needed   metFORMIN (GLUCOPHAGE-XR) 500 MG 24 hr tablet 2 tablets, Oral, Daily   pioglitazone (ACTOS) 15 mg, Oral, Daily   quinapril-hydrochlorothiazide (ACCURETIC) 20-12.5 MG per tablet 1 tablet, Oral, Daily,     Radiology:   No results found.  Cardiac Studies:   Carotid artery duplex  11/21/2018: Minimal stenosis in the right internal carotid artery (minimal).  Stenosis in the right external carotid artery (<50%). Minimal stenosis in the left internal carotid artery (minimal).  Stenosis in the left external carotid artery (<50%). Antegrade right vertebral artery flow. Antegrade left vertebral artery flow. Bilateral external carotid stenosis is the source of bruit.  Follow up studies when clinically indicated.  Lexiscan Cardiolite stress test 12/04/2018: Lexiscan stress test was performed. Stress EKG is non-diagnostic, as this is pharmacological stress test. Stress EKG is non-diagnostic, as this is pharmacological stress test.  LV cavity is dilated at rest and stress. SPECT imaging show small sized mild intensity perfusion defect at both rest and stress images. STress LVEF 65%. Low risk stress test.   Echocardiogram 08/15/2020: Left ventricle  cavity is normal in size. Mild concentric hypertrophy of the left ventricle. Mildly depressed LV systolic function with EF 45-50%. Normal global wall motion. Doppler evidence of grade II (pseudonormal) diastolic dysfunction, elevated LAP. Calculated EF 50%. Left atrial cavity is mildly dilated. Trileaflet aortic valve.  Trace aortic stenosis and regurgitation. Moderate (Grade II) mitral regurgitation. Mild tricuspid regurgitation. Estimated pulmonary artery systolic pressure 24 mmHg. Previous study on 11/21/2018 noted EF 50-55%, grade 1 DD, grade I MR, inadequate TR jet to calculated PASP.  EKG  EKG 09/05/2020: Marked sinus bradycardia at rate of 55 bpm with first-degree AV block, left atrial enlargement, left axis deviation, left anterior fascicular block.  LVH.    EKG 07/25/2020: Sinus rhythm with first-degree AV block at rate of 54 bpm, left atrial enlargement, left axis deviation, left anterior fascicular block.  IVCD, LVH.  No significant change from 07/26/2019.   Assessment     ICD-10-CM   1. Chronic diastolic (congestive) heart failure (HCC)  I50.32 EKG 12-Lead    2. Dyspnea on exertion  R06.00     3. Essential hypertension  I10     4. OSA on CPAP  G47.33    Z99.89     5. Type 2 diabetes mellitus with stage 3b chronic kidney disease, without long-term current use of insulin (HCC)  E11.22 aspirin (ASPIRIN CHILDRENS) 81 MG chewable tablet   N18.32       Meds ordered this encounter  Medications   aspirin (ASPIRIN CHILDRENS) 81 MG chewable tablet    Sig: Chew 1 tablet (81 mg total) by mouth daily.   Recommendations:   Dale Dale [name]  is a 70 y.o. 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 Donnetta Hutching in [facility].  He has been on  flecainide 50 mg twice daily  along with atenolol 50 mg daily and diagnosed with severe sleep apnea and has been compliant with CPAP and since then has not had any recurrence.  He is on aspirin and not on anticoagulation.  He established with me on 11/09/2018.  This is 6-week office visit, he is presently doing well, tolerating [place] without any side effects, renal function has remained stable.  Also his leg edema has completely resolved.  He is also losing weight and his hemoglobin A1c is now normal.   No clinical evidence of heart failure.  Mild decreased LVEF noted on echocardiogram.  We will continue to monitor this and will consider repeating echo in 6 months, blood pressure is well controlled.  His lipids are also well controlled.  I encouraged him to continue to lose weight.  I will see him back in 6 months for follow-up.  With regard to atrial fibrillation, he maintained sinus rhythm.  He has been chronically on flecainide, no change in the EKG, I left it alone for now but could consider discontinuing this once he achieves weight of <BMI 30 at least.  He has been compliant with his CPAP.    Adrian Prows, MD, [facility] 09/06/2020, 10:18 AM Office: [phone] Fax: [phone] Pager: [phone]

## 2020-09-22 ENCOUNTER — Encounter: Payer: Self-pay | Admitting: Gastroenterology

## 2020-10-31 ENCOUNTER — Encounter: Payer: Self-pay | Admitting: Cardiology

## 2020-10-31 ENCOUNTER — Ambulatory Visit: Payer: Commercial Managed Care - PPO | Admitting: Cardiology

## 2020-10-31 ENCOUNTER — Other Ambulatory Visit: Payer: Self-pay

## 2020-10-31 VITALS — BP 94/56 | HR 50 | Temp 98.1°F | Resp 17 | Ht 70.0 in | Wt 231.0 lb

## 2020-10-31 DIAGNOSIS — E1122 Type 2 diabetes mellitus with diabetic chronic kidney disease: Secondary | ICD-10-CM

## 2020-10-31 DIAGNOSIS — I952 Hypotension due to drugs: Secondary | ICD-10-CM

## 2020-10-31 DIAGNOSIS — I5033 Acute on chronic diastolic (congestive) heart failure: Secondary | ICD-10-CM

## 2020-10-31 DIAGNOSIS — R0609 Other forms of dyspnea: Secondary | ICD-10-CM

## 2020-10-31 DIAGNOSIS — I10 Essential (primary) hypertension: Secondary | ICD-10-CM

## 2020-10-31 DIAGNOSIS — R0600 Dyspnea, unspecified: Secondary | ICD-10-CM

## 2020-10-31 MED ORDER — ATENOLOL 25 MG PO TABS: 25.0000 mg | ORAL_TABLET | Freq: Every day | ORAL | 3 refills | Status: DC

## 2020-10-31 NOTE — Progress Notes (Signed)
Primary Physician/Referring:  Christain Sacramento, MD  Patient ID: ECHO ALLSBROOK, male    DOB: [birth year], 70 y.o.   MRN: [id number]  No chief complaint on file.  HPI:    DENALI BECVAR  is a 70 y.o. 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 Donnetta Hutching in [facility].  He has been on flecainide 50 mg twice daily along with atenolol 50 mg daily and diagnosed with severe sleep apnea and has been compliant with CPAP and since then has not had any recurrence.  He is on aspirin and not on anticoagulation.  He established with me on 11/09/2018.  Patient was evaluated at [name] emergency department in [place] 10/28/2020 that is 2 days ago when he presented with acute onset dyspnea on exertion, chest x-ray revealing pulmonary edema and also he had significant leg edema.  Was diuresed with IV furosemide and advised to follow-up with me.  Since hospital/ED discharge, he is feeling better, still has mild shortness of breath and has mild leg edema.  He is accompanied by his wife.  No chest pain.  He was also told to have acute renal failure.  Past Medical History:  Diagnosis Date   Arthritis    Diabetes mellitus without complication ([place])    Epigastric hernia    Gout    Hyperlipidemia    Hypertension    Tubular adenoma of colon 12/2010   Past Surgical History:  Procedure Laterality Date   CATARACT EXTRACTION W/ INTRAOCULAR LENS IMPLANT     right   HIP SURGERY     INGUINAL HERNIA REPAIR  02/22/2009   left   KNEE ARTHROSCOPY  02/23/2008   left   Family History  Problem Relation Age of Onset   Diabetes Mother    Lung cancer Father     Social History   Tobacco Use   Smoking status: Former    Years: 10.00    Types: Cigars,  Cigarettes    Quit date: 2000    Years since quitting: 22.7   Smokeless tobacco: Former    Types: Chew    Quit date: 2000   Tobacco comments:    quite 1979 cigarettes  Substance Use Topics   Alcohol use: No   Marital Status: Married  ROS  Review of Systems  Constitutional: Positive for malaise/fatigue. Negative for weight loss.  Cardiovascular:  Positive for dyspnea on exertion and leg swelling. Negative for syncope.  Respiratory:  Positive for snoring (on CPAP and compliant).   Musculoskeletal:  Positive for joint pain (hands and knee).  Gastrointestinal:  Positive for bloating, change in bowel habit and dysphagia. Negative for melena.  Neurological:  Positive for dizziness.  All other systems reviewed and are negative. Objective  Blood pressure (!) 94/56, pulse (!) 50, temperature 98.1 F (36.7 C), temperature source Temporal, resp. rate 17, height _0  (1.778 m), weight 231 lb (104.8 kg), SpO2 99 %.  Vitals with BMI 10/31/2020 10/31/2020 09/05/2020  Height - _1  _2   Weight - 231 lbs 230 lbs 6 oz  BMI - 70.14 10.30  Systolic 94 91 131  Diastolic 56 53 62  Pulse 50 55 58     Physical Exam Constitutional:      General: He is not  in acute distress.    Appearance: He is well-developed.     Comments: Well built and moderately obese in no acute distress  Neck:     Vascular: JVD present. No carotid bruit.  Cardiovascular:     Rate and Rhythm: Normal rate and regular rhythm.     Pulses: Normal pulses and intact distal pulses.     Heart sounds: Normal heart sounds. No murmur heard.   No gallop.  Pulmonary:     Effort: Pulmonary effort is normal. No accessory muscle usage.     Breath sounds: Normal breath sounds.  Abdominal:     General: Bowel sounds are normal.     Palpations: Abdomen is soft.     Comments: Obese abdomen  Musculoskeletal:        General: Swelling (1-2+ bilateral pitting edema) present.   Laboratory examination:   CMP Latest Ref Rng & Units  10/31/2020 08/20/2020 07/13/2010  Glucose 65 - 99 mg/dL 95 84 117(H)  BUN 8 - 27 mg/dL 44(H) 39(H) 29(H)  Creatinine 0.76 - 1.27 mg/dL 2.37(H) 1.98(H) 2.30(H)  Sodium 134 - 144 mmol/L 142 138 135  Potassium 3.5 - 5.2 mmol/L 4.5 4.7 4.1  Chloride 96 - 106 mmol/L 108(H) 105 103  CO2 20 - 29 mmol/L 18(L) 17(L) -  Calcium 8.6 - 10.2 mg/dL 8.7 9.2 -   CBC Latest Ref Rng & Units 10/31/2020 07/13/2010  WBC 3.4 - 10.8 x10E3/uL 5.1 -  Hemoglobin 13.0 - 17.7 g/dL 9.6(L) 15.6  Hematocrit 37.5 - 51.0 % 29.1(L) 46.0  Platelets 150 - 450 x10E3/uL 92(LL) -   estimated creatinine clearance is 35.7 mL/min (A) (by C-G formula based on SCr of 2.37 mg/dL (H)).  Lipid Panel  No results found for: CHOL, TRIG, HDL, CHOLHDL, VLDL, LDLCALC, LDLDIRECT HEMOGLOBIN A1C Lab Results  Component Value Date   HGBA1C 5.8 (H) 08/20/2020   BNP (last 3 results) Recent Labs    08/20/20 1111 10/31/20 1228  BNP 252.2* 412.2*   External labs:   Labs 10/28/2020: Hb 9.9/HCT 30.5, platelets 78.  Sodium 140, potassium 5.0, BUN 52, creatinine 2.96, EGFR 24 mL.  Labs 03/18/2020:  Hb 11.9/HCT 36.2, platelets 66.  Microcytic indicis.  Sodium 140, potassium 4.1, BUN 39, creatinine 2.06, EGFR 34 mL.  Serum glucose 132.  AST elevated at 73, ALT normal at 39.  Total cholesterol 134, triglycerides 394, HDL 18, LDL 37 (direct).  Non-HDL cholesterol 116.  A1c 7.7%.  PSA normal, uric acid normal.  Lab 04/18/2019:  A1c 7.9%.  Sodium 139, potassium 4.2, BUN 33, creatinine 1.86, AST minimally elevated at 41, ALT 40 being normal.  Total cholesterol 161, triglycerides 06/22/1973, HDL 17, direct LDL 50.  Hb 12.1/HCT 36.5, platelets 86, WBC 4.7.  Hemoglobin A1C 10/10/2018 4:01 PM  Medications and allergies   Allergies  Allergen Reactions   Allopurinol Other (See Comments)    Foot pain, not a rash., 19 Mar 2010    Current Outpatient Medications  Medication Instructions   ACCU-CHEK GUIDE test strip No dose, route, or  frequency recorded.   acetaminophen (TYLENOL) 500 mg, Oral, Every 12 hours PRN   aspirin (ASPIRIN CHILDRENS) 81 mg, Oral, Daily   atenolol (TENORMIN) 25 mg, Oral, Daily   atorvastatin (LIPITOR) 80 MG tablet 1 tablet, Oral, Daily   colchicine-probenecid 0.5-500 MG per tablet 1 tablet, Oral, Daily,     fenofibrate (TRICOR) 145 mg, Oral, Daily,     Finerenone (KERENDIA) 10 MG TABS 1 tablet, Oral, Daily   furosemide (  LASIX) 40 mg, Oral, Every morning   Lancets (ONETOUCH ULTRASOFT) lancets Test blood sugar twice a day and as needed   metFORMIN (GLUCOPHAGE-XR) 500 MG 24 hr tablet 2 tablets, Oral, Daily   quinapril-hydrochlorothiazide (ACCURETIC) 20-12.5 MG per tablet 1 tablet, Oral, Daily,     Medications after today's encounter Current Outpatient Medications  Medication Instructions   ACCU-CHEK GUIDE test strip No dose, route, or frequency recorded.   acetaminophen (TYLENOL) 500 mg, Oral, Every 12 hours PRN   aspirin (ASPIRIN CHILDRENS) 81 mg, Oral, Daily   atenolol (TENORMIN) 25 mg, Oral, Daily   atorvastatin (LIPITOR) 80 MG tablet 1 tablet, Oral, Daily   colchicine-probenecid 0.5-500 MG per tablet 1 tablet, Oral, Daily,     fenofibrate (TRICOR) 145 mg, Oral, Daily,     Finerenone (KERENDIA) 10 MG TABS 1 tablet, Oral, Daily   furosemide (LASIX) 40 mg, Oral, Every morning   Lancets (ONETOUCH ULTRASOFT) lancets Test blood sugar twice a day and as needed   metFORMIN (GLUCOPHAGE-XR) 500 MG 24 hr tablet 2 tablets, Oral, Daily   quinapril-hydrochlorothiazide (ACCURETIC) 20-12.5 MG per tablet 1 tablet, Oral, Daily,     Radiology:   No results found.  Cardiac Studies:   Carotid artery duplex  11/21/2018: Minimal stenosis in the right internal carotid artery (minimal).  Stenosis in the right external carotid artery (<50%). Minimal stenosis in the left internal carotid artery (minimal).  Stenosis in the left external carotid artery (<50%). Antegrade right vertebral artery flow. Antegrade left  vertebral artery flow. Bilateral external carotid stenosis is the source of bruit.  Follow up studies when clinically indicated.  Lexiscan Cardiolite stress test 12/04/2018: Lexiscan stress test was performed. Stress EKG is non-diagnostic, as this is pharmacological stress test. Stress EKG is non-diagnostic, as this is pharmacological stress test.  LV cavity is dilated at rest and stress. SPECT imaging show small sized mild intensity perfusion defect at both rest and stress images. STress LVEF 65%. Low risk stress test.   Echocardiogram 08/15/2020: Left ventricle cavity is normal in size. Mild concentric hypertrophy of the left ventricle. Mildly depressed LV systolic function with EF 45-50%. Normal global wall motion. Doppler evidence of grade II (pseudonormal) diastolic dysfunction, elevated LAP. Calculated EF 50%. Left atrial cavity is mildly dilated. Trileaflet aortic valve.  Trace aortic stenosis and regurgitation. Moderate (Grade II) mitral regurgitation. Mild tricuspid regurgitation. Estimated pulmonary artery systolic pressure 24 mmHg. Previous study on 11/21/2018 noted EF 50-55%, grade 1 DD, grade I MR, inadequate TR jet to calculated PASP.  EKG  EKG 09/05/2020: Marked sinus bradycardia at rate of 55 bpm with first-degree AV block, left atrial enlargement, left axis deviation, left anterior fascicular block.  LVH.    EKG 07/25/2020: Sinus rhythm with first-degree AV block at rate of 54 bpm, left atrial enlargement, left axis deviation, left anterior fascicular block.  IVCD, LVH.  No significant change from 07/26/2019.   Assessment     ICD-10-CM   1. Acute on chronic diastolic heart failure (HCC)  A57.90 Basic metabolic panel    Brain natriuretic peptide    PCV ECHOCARDIOGRAM COMPLETE    PCV MYOCARDIAL PERFUSION WITH LEXISCAN    CBC    furosemide (LASIX) 40 MG tablet    Basic metabolic panel    Brain natriuretic peptide    2. Dyspnea on exertion  R06.00 PCV MYOCARDIAL PERFUSION  WITH LEXISCAN    3. Controlled type 2 diabetes mellitus with stage 3 chronic kidney disease, without long-term current use of insulin ([place])  E11.22    N18.30     4. Essential hypertension  I10 atenolol (TENORMIN) 25 MG tablet    5. Hypotension due to drugs  I95.2       Medications Discontinued During This Encounter  Medication Reason   pioglitazone (ACTOS) 15 MG tablet Discontinued by provider   flecainide (TAMBOCOR) 50 MG tablet Discontinued by provider   atenolol (TENORMIN) 50 MG tablet     Meds ordered this encounter  Medications   atenolol (TENORMIN) 25 MG tablet    Sig: Take 1 tablet (25 mg total) by mouth daily.    Dispense:  90 tablet    Refill:  3   furosemide (LASIX) 40 MG tablet    Sig: Take 1 tablet (40 mg total) by mouth in the morning.    Dispense:  30 tablet    Refill:  2   Recommendations:   KAROL LIENDO  is a 70 y.o. 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 Donnetta Hutching in [facility].  He has been on flecainide 50 mg twice daily along with atenolol 50 mg daily and diagnosed with severe sleep apnea and has been compliant with CPAP and since then has not had any recurrence.  He is on aspirin and not on anticoagulation.  He established with me on 11/09/2018.  Patient was evaluated at [facility] emergency department in [place] 10/28/2020 that is 2 days ago when he presented with acute onset dyspnea on exertion, chest x-ray revealing pulmonary edema and also he had significant leg edema.  Was diuresed with IV furosemide and advised to follow-up with me.  He still has mild JVD, mild leg edema, hepatojugular reflux.  He is still in acute diastolic heart failure.  In view of his multiple medical comorbidity including  especially diabetes mellitus, underlying coronary artery disease need to be excluded.  He also has mildly reduced LVEF by echocardiogram.  In view of acute heart failure, I have discontinued Actos.  Although he has had echocardiogram about 2 to 2-1/2 months ago, I will repeat an echocardiogram and also I will set him up for [place] nuclear stress test, patient unable to do the treadmill stress test due to marked dyspnea.  I will obtain a BMP today and also BNP as his serum creatinine while he was in the emergency department had risen to 2.8 with a EGFR of 24 mL, previously he had only stage IIIb chronic kidney disease.  With regard to hypotension, suspect it could be related to underlying congestive heart failure, reduced LVEF, flecainide may be also be contributing.  I have discontinued this as it is a contraindication to use flecainide in heart failure patients.  Discussed this with the patient that he may have recurrence of atrial fibrillation.  I also reduced the dose of atenolol from 50 mg to 25 mg daily.  Weight loss was discussed with the patient and his wife at the bedside.  All questions answered.  40-minute office visit encounter.  I like to see him back in 2 weeks for follow-up. His labs were reviewed, renal function is back to baseline.  BNP suggest heart failure.  We will reevaluate him soon in the office.  Will advise him to continue furosemide 40 mg once a day for now, recheck  BMP and BNP in 2 weeks prior to his next office visit.   Adrian Prows, MD, [facility] 11/03/2020, 10:20 PM Office: [phone] Fax: [phone] Pager: [phone]

## 2020-11-01 LAB — BASIC METABOLIC PANEL
BUN/Creatinine Ratio: 19 (ref 10–24)
BUN: 44 mg/dL — ABNORMAL HIGH (ref 8–27)
CO2: 18 mmol/L — ABNORMAL LOW (ref 20–29)
Calcium: 8.7 mg/dL (ref 8.6–10.2)
Chloride: 108 mmol/L — ABNORMAL HIGH (ref 96–106)
Creatinine, Ser: 2.37 mg/dL — ABNORMAL HIGH (ref 0.76–1.27)
Glucose: 95 mg/dL (ref 65–99)
Potassium: 4.5 mmol/L (ref 3.5–5.2)
Sodium: 142 mmol/L (ref 134–144)
eGFR: 29 mL/min/{1.73_m2} — ABNORMAL LOW (ref >59)

## 2020-11-01 LAB — CBC
Hematocrit: 29.1 % — ABNORMAL LOW (ref 37.5–51.0)
Hemoglobin: 9.6 g/dL — ABNORMAL LOW (ref 13.0–17.7)
MCH: 34.4 pg — ABNORMAL HIGH (ref 26.6–33.0)
MCHC: 33 g/dL (ref 31.5–35.7)
MCV: 104 fL — ABNORMAL HIGH (ref 79–97)
Platelets: 92 10*3/uL — CL (ref 150–450)
RBC: 2.79 x10E6/uL — ABNORMAL LOW (ref 4.14–5.80)
RDW: 13.1 % (ref 11.6–15.4)
WBC: 5.1 10*3/uL (ref 3.4–10.8)

## 2020-11-01 LAB — BRAIN NATRIURETIC PEPTIDE: BNP: 412.2 pg/mL — ABNORMAL HIGH (ref 0.0–100.0)

## 2020-11-03 ENCOUNTER — Other Ambulatory Visit: Payer: Self-pay

## 2020-11-03 ENCOUNTER — Ambulatory Visit: Payer: Commercial Managed Care - PPO

## 2020-11-03 DIAGNOSIS — R0609 Other forms of dyspnea: Secondary | ICD-10-CM

## 2020-11-03 DIAGNOSIS — I5033 Acute on chronic diastolic (congestive) heart failure: Secondary | ICD-10-CM

## 2020-11-03 DIAGNOSIS — R0600 Dyspnea, unspecified: Secondary | ICD-10-CM

## 2020-11-03 MED ORDER — FUROSEMIDE 40 MG PO TABS
40.0000 mg | ORAL_TABLET | Freq: Every morning | ORAL | 2 refills | Status: DC
Start: 2020-11-03 — End: 2020-11-18

## 2020-11-04 ENCOUNTER — Ambulatory Visit: Payer: Commercial Managed Care - PPO

## 2020-11-04 ENCOUNTER — Ambulatory Visit: Payer: Commercial Managed Care - PPO | Admitting: Cardiology

## 2020-11-04 DIAGNOSIS — I5033 Acute on chronic diastolic (congestive) heart failure: Secondary | ICD-10-CM

## 2020-11-05 ENCOUNTER — Ambulatory Visit: Payer: PRIVATE HEALTH INSURANCE | Admitting: Gastroenterology

## 2020-11-18 ENCOUNTER — Ambulatory Visit: Payer: Commercial Managed Care - PPO | Admitting: Cardiology

## 2020-11-18 ENCOUNTER — Encounter: Payer: Self-pay | Admitting: Cardiology

## 2020-11-18 ENCOUNTER — Other Ambulatory Visit: Payer: Self-pay

## 2020-11-18 VITALS — BP 91/51 | HR 72 | Temp 98.0°F | Resp 16 | Ht 70.0 in | Wt 216.6 lb

## 2020-11-18 DIAGNOSIS — I5032 Chronic diastolic (congestive) heart failure: Secondary | ICD-10-CM

## 2020-11-18 DIAGNOSIS — N1832 Chronic kidney disease, stage 3b: Secondary | ICD-10-CM

## 2020-11-18 DIAGNOSIS — I952 Hypotension due to drugs: Secondary | ICD-10-CM

## 2020-11-18 MED ORDER — FUROSEMIDE 40 MG PO TABS: 20.0000 mg | ORAL_TABLET | Freq: Every day | ORAL | 2 refills | Status: DC | PRN

## 2020-11-18 NOTE — Progress Notes (Signed)
Primary Physician/Referring:  Christain Sacramento, MD  Patient ID: Dale [name], male    DOB: 04-06-1950, 70 y.o.   MRN: [id number]  Chief Complaint  Patient presents with   Congestive Heart Failure   Renal Failure   Follow-up    2 weeks    HPI:    Dale [name]  is a 70 y.o. 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 Donnetta Hutching in [facility].  He has been on flecainide 50 mg twice daily along with atenolol 50 mg daily and diagnosed with severe sleep apnea and has been compliant with CPAP and since then has not had any recurrence.  Obviously he is now off of flecainide since acute decompensated heart failure.  He is on aspirin and not on anticoagulation.     Seen recently in an outside facility for acute decompensated heart failure and discharged home with advised to follow-up with me.  I have started him on Lasix, obtain labs, ordered echocardiogram and stress test he now presents for follow-up.  Leg edema has completely resolved.  Although he has noticed low blood pressure, denies any dizziness or syncope.  He is accompanied by his wife at the bedside.  No chest pain.  Dyspnea has essentially resolved and is back to baseline.   Past Medical History:  Diagnosis Date   Arthritis    Diabetes mellitus without complication ([place])    Epigastric hernia    Gout    Hyperlipidemia    Hypertension    Tubular adenoma of colon 12/2010   Past Surgical History:  Procedure Laterality Date   CATARACT EXTRACTION W/ INTRAOCULAR LENS IMPLANT     right   HIP SURGERY     INGUINAL HERNIA REPAIR  02/22/2009   left   KNEE ARTHROSCOPY  02/23/2008   left   Family History  Problem Relation Age of Onset   Diabetes Mother    Lung cancer Father     Social  History   Tobacco Use   Smoking status: Former    Years: 10.00    Types: Cigars, Cigarettes    Quit date: 2000    Years since quitting: 22.7   Smokeless tobacco: Former    Types: Chew    Quit date: 2000   Tobacco comments:    quite 1979 cigarettes  Substance Use Topics   Alcohol use: No   Marital Status: Married  ROS  Review of Systems  Constitutional: Positive for malaise/fatigue. Negative for weight loss.  Cardiovascular:  Positive for dyspnea on exertion and leg swelling. Negative for syncope.  Respiratory:  Positive for snoring (on CPAP and compliant).   Musculoskeletal:  Positive for joint pain (hands and knee).  Gastrointestinal:  Positive for bloating, change in bowel habit and dysphagia. Negative for melena.  Neurological:  Positive for dizziness.  All other systems reviewed and are negative. Objective  Blood pressure (!) 91/51, pulse 72, temperature 98 F (36.7 C), temperature source Temporal, resp. rate 16, height 5' 10"  (1.778 m), weight 216 lb 9.6 oz (98.2 kg), SpO2 98 %.  Vitals with BMI 11/18/2020 11/18/2020 10/31/2020  Height - 5' 10"  -  Weight - 216 lbs 10 oz -  BMI - 63.33 -  Systolic 91 86 94  Diastolic 51 50 56  Pulse - 72 50     Physical Exam Constitutional:      General: He is not in acute distress.    Appearance: He is well-developed.     Comments: Well built and moderately obese in no acute distress  Neck:     Vascular: JVD present. No carotid bruit.  Cardiovascular:     Rate and Rhythm: Normal rate and regular rhythm.     Pulses: Normal pulses and intact distal pulses.     Heart sounds: Normal heart sounds. No murmur heard.   No gallop.  Pulmonary:     Effort: Pulmonary effort is normal. No accessory muscle usage.     Breath sounds: Normal breath sounds.  Abdominal:     General: Bowel sounds are normal.     Palpations: Abdomen is soft.     Comments: Obese abdomen  Musculoskeletal:        General: Swelling (1-2+ bilateral pitting edema)  present.   Laboratory examination:   CMP Latest Ref Rng & Units 10/31/2020 08/20/2020 07/13/2010  Glucose 65 - 99 mg/dL 95 84 117(H)  BUN 8 - 27 mg/dL 44(H) 39(H) 29(H)  Creatinine 0.76 - 1.27 mg/dL 2.37(H) 1.98(H) 2.30(H)  Sodium 134 - 144 mmol/L 142 138 135  Potassium 3.5 - 5.2 mmol/L 4.5 4.7 4.1  Chloride 96 - 106 mmol/L 108(H) 105 103  CO2 20 - 29 mmol/L 18(L) 17(L) -  Calcium 8.6 - 10.2 mg/dL 8.7 9.2 -   CBC Latest Ref Rng & Units 10/31/2020 07/13/2010  WBC 3.4 - 10.8 x10E3/uL 5.1 -  Hemoglobin 13.0 - 17.7 g/dL 9.6(L) 15.6  Hematocrit 37.5 - 51.0 % 29.1(L) 46.0  Platelets 150 - 450 x10E3/uL 92(LL) -   estimated creatinine clearance is 34.1 mL/min (A) (by C-G formula based on SCr of 2.37 mg/dL (H)).  Lipid Panel  No results found for: CHOL, TRIG, HDL, CHOLHDL, VLDL, LDLCALC, LDLDIRECT HEMOGLOBIN A1C Lab Results  Component Value Date   HGBA1C 5.8 (H) 08/20/2020   BNP (last 3 results) Recent Labs    08/20/20 1111 10/31/20 1228  BNP 252.2* 412.2*   External labs:   Labs 10/28/2020:  Hb 9.9/HCT 30.5, platelets 78.  Sodium 140, potassium 5.0, BUN 52, creatinine 2.96, EGFR 24 mL.  Labs 03/18/2020:  Hb 11.9/HCT 36.2, platelets 66.  Microcytic indicis.  Sodium 140, potassium 4.1, BUN 39, creatinine 2.06, EGFR 34 mL.  Serum glucose 132.  AST elevated at 73, ALT normal at 39.  Total cholesterol 134, triglycerides 394, HDL 18, LDL 37 (direct).  Non-HDL cholesterol 116.  A1c 7.7%.  PSA normal, uric acid normal.  Lab 04/18/2019:  A1c 7.9%.  Sodium 139, potassium 4.2, BUN 33, creatinine 1.86, AST minimally elevated at 41, ALT 40 being normal.  Total cholesterol 161, triglycerides 06/22/1973, HDL 17, direct LDL 50.  Hb 12.1/HCT 36.5, platelets 86, WBC 4.7.  Hemoglobin A1C 10/10/2018 4:01 PM  Medications and allergies   Allergies  Allergen Reactions   Allopurinol Other (See Comments)    Foot pain, not a rash., 19 Mar 2010    Current Outpatient Medications  Medication  Instructions   ACCU-CHEK GUIDE test strip No dose, route, or frequency recorded.   acetaminophen (TYLENOL) 500 mg, Oral, Every 12 hours PRN   aspirin (ASPIRIN CHILDRENS) 81 mg, Oral, Daily   atenolol (TENORMIN) 25 mg, Oral, Daily   atorvastatin (LIPITOR) 80 MG tablet 1 tablet, Oral, Daily   colchicine-probenecid 0.5-500 MG per tablet 1 tablet, Oral, Daily,     fenofibrate (  TRICOR) 145 mg, Oral, Daily,     Finerenone (KERENDIA) 10 MG TABS 1 tablet, Oral, Daily   furosemide (LASIX) 20 mg, Oral, Daily PRN   Lancets (ONETOUCH ULTRASOFT) lancets Test blood sugar twice a day and as needed   metFORMIN (GLUCOPHAGE-XR) 500 MG 24 hr tablet 2 tablets, Oral, Daily   quinapril-hydrochlorothiazide (ACCURETIC) 20-12.5 MG per tablet 1 tablet, Oral, Daily,     Medications after today's encounter Current Outpatient Medications  Medication Instructions   ACCU-CHEK GUIDE test strip No dose, route, or frequency recorded.   acetaminophen (TYLENOL) 500 mg, Oral, Every 12 hours PRN   aspirin (ASPIRIN CHILDRENS) 81 mg, Oral, Daily   atenolol (TENORMIN) 25 mg, Oral, Daily   atorvastatin (LIPITOR) 80 MG tablet 1 tablet, Oral, Daily   colchicine-probenecid 0.5-500 MG per tablet 1 tablet, Oral, Daily,     fenofibrate (TRICOR) 145 mg, Oral, Daily,     Finerenone (KERENDIA) 10 MG TABS 1 tablet, Oral, Daily   furosemide (LASIX) 20 mg, Oral, Daily PRN   Lancets (ONETOUCH ULTRASOFT) lancets Test blood sugar twice a day and as needed   metFORMIN (GLUCOPHAGE-XR) 500 MG 24 hr tablet 2 tablets, Oral, Daily   quinapril-hydrochlorothiazide (ACCURETIC) 20-12.5 MG per tablet 1 tablet, Oral, Daily,     Radiology:   No results found.  Cardiac Studies:   Carotid artery duplex  11/21/2018: Minimal stenosis in the right internal carotid artery (minimal).  Stenosis in the right external carotid artery (<50%). Minimal stenosis in the left internal carotid artery (minimal).  Stenosis in the left external carotid artery  (<50%). Antegrade right vertebral artery flow. Antegrade left vertebral artery flow. Bilateral external carotid stenosis is the source of bruit.  Follow up studies when clinically indicated.  PCV ECHOCARDIOGRAM COMPLETE 11/04/2020  Narrative Echocardiogram 11/04/2020: Left ventricle cavity is normal in size and wall thickness. Normal global wall motion. Normal LV systolic function with EF 59%. Normal diastolic filling pattern. Mild (Grade I) aortic regurgitation. Mild (Grade I) mitral regurgitation. Previous study on 08/15/2020 reported EF 45-50%, mod MR, mild TR. PCV MYOCARDIAL PERFUSION WITH LEXISCAN 11/03/2020  Narrative   Findings are equivocal. The study is low risk.   Left ventricular function is normal. The left ventricular ejection fraction is hyperdynamic (>65%). End diastolic cavity size is normal.  Lexiscan Tetrofosmin stress test 11/03/2020: Lexiscan nuclear stress test performed using 1-day protocol. Small area of mildly decreased tracer uptake in apical inferior myocardium, likely due to gut attenuation. All segments of left ventricle demonstrated normal wall motion and thickening. Stress LVEF 81%. Low risk study.   EKG  EKG 09/05/2020: Marked sinus bradycardia at rate of 55 bpm with first-degree AV block, left atrial enlargement, left axis deviation, left anterior fascicular block.  LVH.    EKG 07/25/2020: Sinus rhythm with first-degree AV block at rate of 54 bpm, left atrial enlargement, left axis deviation, left anterior fascicular block.  IVCD, LVH.  No significant change from 07/26/2019.   Assessment     ICD-10-CM   1. Chronic diastolic (congestive) heart failure (HCC)  I50.32 furosemide (LASIX) 40 MG tablet    2. Type 2 diabetes mellitus with stage 3b chronic kidney disease, without long-term current use of insulin (HCC)  E11.22    N18.32     3. Hypotension due to drugs  I95.2        Medications Discontinued During This Encounter  Medication Reason    furosemide (LASIX) 40 MG tablet      Meds ordered this encounter  Medications  furosemide (LASIX) 40 MG tablet    Sig: Take 0.5 tablets (20 mg total) by mouth daily as needed for edema.    Dispense:  30 tablet    Refill:  2    Recommendations:   Dale [name]  is a 70 y.o. 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 Donnetta Hutching in [facility].  He has been on flecainide 50 mg twice daily along with atenolol 50 mg daily and diagnosed with severe sleep apnea and has been compliant with CPAP and since then has not had any recurrence.  Obviously he is now off of flecainide since acute decompensated heart failure.  He is on aspirin and not on anticoagulation.     Patient was evaluated at [facility] emergency department in [place] 10/28/2020 that is 2 days ago when he presented with acute onset dyspnea on exertion, chest x-ray revealing pulmonary edema and also he had significant leg edema.  Treated for acute decompensated diastolic heart failure.  I had seen him 2 weeks ago he now presents for follow-up.  I had continued his diuretics, follow-up on his renal function which is fortunately remained stable.  His leg edema has completely resolved.  Dyspnea has significantly improved.  I changed his furosemide to be taken on a as needed basis.  Continue quinapril HCT for now, blood pressure is low, because of excessive diuresis, will change his furosemide to be taken on a as needed basis.  He is also on [place] which he is tolerating without side effects and worsening renal function.  Hopefully stopping his furosemide will improve his blood pressure, although low blood pressure, he is completely asymptomatic without any dizziness or syncope.  Patient  will be screened into LUX-Sx TRENDS study (loop implantation for heart failure sensor) if he consents.  I have reviewed the results of the stress test and echocardiogram with the patient and his wife at the bedside.  I would like to see him back in 3 months.  Extensive discussions regarding diet, weight loss.  He probably may benefit from nephrology consultation.   Adrian Prows, MD, [facility] 11/18/2020, 9:24 PM Office: [phone] Fax: [phone] Pager: [phone]

## 2021-01-03 ENCOUNTER — Other Ambulatory Visit: Payer: Self-pay | Admitting: Cardiology

## 2021-01-03 DIAGNOSIS — Z8679 Personal history of other diseases of the circulatory system: Secondary | ICD-10-CM

## 2021-01-14 ENCOUNTER — Other Ambulatory Visit: Payer: Self-pay | Admitting: Nephrology

## 2021-01-14 DIAGNOSIS — N179 Acute kidney failure, unspecified: Secondary | ICD-10-CM

## 2021-01-14 DIAGNOSIS — N1832 Chronic kidney disease, stage 3b: Secondary | ICD-10-CM

## 2021-01-20 ENCOUNTER — Ambulatory Visit
Admission: RE | Admit: 2021-01-20 | Discharge: 2021-01-20 | Disposition: A | Discharge status: Home / Self Care | Payer: No Typology Code available for payment source | Source: Ambulatory Visit | Attending: Nephrology | Admitting: Nephrology

## 2021-01-20 DIAGNOSIS — N1832 Chronic kidney disease, stage 3b: Secondary | ICD-10-CM

## 2021-01-20 DIAGNOSIS — N179 Acute kidney failure, unspecified: Secondary | ICD-10-CM

## 2021-01-31 ENCOUNTER — Other Ambulatory Visit: Payer: Self-pay | Admitting: Cardiology

## 2021-01-31 DIAGNOSIS — I5032 Chronic diastolic (congestive) heart failure: Secondary | ICD-10-CM

## 2021-02-05 ENCOUNTER — Other Ambulatory Visit: Payer: Self-pay

## 2021-02-05 ENCOUNTER — Other Ambulatory Visit: Payer: Self-pay | Admitting: Cardiology

## 2021-02-05 DIAGNOSIS — I10 Essential (primary) hypertension: Secondary | ICD-10-CM

## 2021-02-05 DIAGNOSIS — Z8679 Personal history of other diseases of the circulatory system: Secondary | ICD-10-CM

## 2021-02-05 MED ORDER — ATENOLOL 25 MG PO TABS: 25.0000 mg | ORAL_TABLET | Freq: Every day | ORAL | 3 refills | Status: DC

## 2021-02-17 ENCOUNTER — Encounter: Payer: Self-pay | Admitting: Cardiology

## 2021-02-17 ENCOUNTER — Other Ambulatory Visit: Payer: Self-pay

## 2021-02-17 ENCOUNTER — Ambulatory Visit: Payer: No Typology Code available for payment source | Admitting: Cardiology

## 2021-02-17 VITALS — BP 123/64 | HR 66 | Temp 97.8°F | Resp 16 | Ht 70.0 in | Wt 229.0 lb

## 2021-02-17 DIAGNOSIS — E1122 Type 2 diabetes mellitus with diabetic chronic kidney disease: Secondary | ICD-10-CM

## 2021-02-17 DIAGNOSIS — I10 Essential (primary) hypertension: Secondary | ICD-10-CM

## 2021-02-17 DIAGNOSIS — I5032 Chronic diastolic (congestive) heart failure: Secondary | ICD-10-CM

## 2021-02-17 NOTE — Progress Notes (Signed)
Primary Physician/Referring:  Christain Sacramento, MD  Patient ID: Dale [name], male    DOB: [birth year], 70 y.o.   MRN: [id number]  Chief Complaint  Patient presents with   Congestive Heart Failure   Follow-up    3 month    HPI:    Dale [name]  is a 70 y.o. 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 Donnetta Hutching in [facility].  He has been on flecainide 50 mg twice daily along with atenolol 50 mg daily and diagnosed with severe sleep apnea and has been compliant with CPAP and since then has not had any recurrence.    Patient presents for follow-up of acute on chronic diastolic heart failure.  He has recuperated well and he has not had any further recurrence of leg edema, PND or orthopnea and there is no clinical evidence of heart failure today.  Last hospitalization for heart failure was in [facility] on 10/28/2020 with pulmonary edema.  This is a 70-monthoffice visit. He is accompanied by his wife at the bedside.  No chest pain.  Chronic dyspnea is back to baseline. No leg edema.   Past Medical History:  Diagnosis Date   Arthritis    Diabetes mellitus without complication ([place]    Epigastric hernia    Gout    Hyperlipidemia    Hypertension    Tubular adenoma of colon 12/2010   Past Surgical History:  Procedure Laterality Date   CATARACT EXTRACTION W/ INTRAOCULAR LENS IMPLANT     right   HIP SURGERY     INGUINAL HERNIA REPAIR  02/22/2009   left   KNEE ARTHROSCOPY  02/23/2008   left   Family History  Problem Relation Age of Onset   Diabetes Mother    Lung cancer Father     Social History   Tobacco Use   Smoking status: Former    Packs/day: 1.50    Years: 10.00    Pack years: 15.00    Types: Cigars, Cigarettes    Quit  date: 2000    Years since quitting: 23.0   Smokeless tobacco: Former    Types: Chew    Quit date: 2000   Tobacco comments:    quite 1979 cigarettes  Substance Use Topics   Alcohol use: No   Marital Status: Married  ROS  Review of Systems  Cardiovascular:  Positive for dyspnea on exertion (stable). Negative for chest pain, leg swelling and syncope.  Respiratory:  Positive for snoring (on CPAP and compliant).   Musculoskeletal:  Positive for joint pain (hands and knee).  Gastrointestinal:  Negative for melena.  All other systems reviewed and are negative. Objective  Blood pressure 123/64, pulse 66, temperature 97.8 F (36.6 C), resp. rate 16, height 5' 10" (1.778 m), weight 229 lb (103.9 kg), SpO2 99 %.  Vitals with BMI 02/17/2021 11/18/2020 11/18/2020  Height 5' 10" - 5' 10"  Weight 229 lbs - 216 lbs 10 oz  BMI 317.79- 339.03 Systolic 100991 86  Diastolic 64 51 50  Pulse 66 - 72     Physical Exam Constitutional:      Appearance: He is well-developed.     Comments: Well built and moderately obese in no acute distress  Neck:  Vascular: JVD present. No carotid bruit.  Cardiovascular:     Rate and Rhythm: Normal rate and regular rhythm.     Pulses: Normal pulses and intact distal pulses.     Heart sounds: Normal heart sounds. No murmur heard.   No gallop.  Pulmonary:     Effort: Pulmonary effort is normal. No accessory muscle usage.     Breath sounds: Normal breath sounds.  Abdominal:     General: Bowel sounds are normal.     Palpations: Abdomen is soft.     Comments: Obese abdomen  Musculoskeletal:     Right lower leg: No edema.     Left lower leg: No edema.   Laboratory examination:   CMP Latest Ref Rng & Units 10/31/2020 08/20/2020 07/13/2010  Glucose 65 - 99 mg/dL 95 84 117(H)  BUN 8 - 27 mg/dL 44(H) 39(H) 29(H)  Creatinine 0.76 - 1.27 mg/dL 2.37(H) 1.98(H) 2.30(H)  Sodium 134 - 144 mmol/L 142 138 135  Potassium 3.5 - 5.2 mmol/L 4.5 4.7 4.1  Chloride 96 - 106  mmol/L 108(H) 105 103  CO2 20 - 29 mmol/L 18(L) 17(L) -  Calcium 8.6 - 10.2 mg/dL 8.7 9.2 -   CBC Latest Ref Rng & Units 10/31/2020 07/13/2010  WBC 3.4 - 10.8 x10E3/uL 5.1 -  Hemoglobin 13.0 - 17.7 g/dL 9.6(L) 15.6  Hematocrit 37.5 - 51.0 % 29.1(L) 46.0  Platelets 150 - 450 x10E3/uL 92(LL) -   CrCl cannot be calculated (Patient's most recent lab result is older than the maximum 21 days allowed.).  Lipid Panel  No results found for: CHOL, TRIG, HDL, CHOLHDL, VLDL, LDLCALC, LDLDIRECT HEMOGLOBIN A1C Lab Results  Component Value Date   HGBA1C 5.8 (H) 08/20/2020   BNP (last 3 results) Recent Labs    08/20/20 1111 10/31/20 1228  BNP 252.2* 412.2*   External labs:   Labs 10/28/2020:  Hb 9.9/HCT 30.5, platelets 78.  Sodium 140, potassium 5.0, BUN 52, creatinine 2.96, EGFR 24 mL.  Labs 03/18/2020:  Hb 11.9/HCT 36.2, platelets 66.  Microcytic indicis.  Sodium 140, potassium 4.1, BUN 39, creatinine 2.06, EGFR 34 mL.  Serum glucose 132.  AST elevated at 73, ALT normal at 39.  Total cholesterol 134, triglycerides 394, HDL 18, LDL 37 (direct).  Non-HDL cholesterol 116.  A1c 7.7%.  PSA normal, uric acid normal.  Lab 04/18/2019:  A1c 7.9%.  Sodium 139, potassium 4.2, BUN 33, creatinine 1.86, AST minimally elevated at 41, ALT 40 being normal.  Total cholesterol 161, triglycerides 06/22/1973, HDL 17, direct LDL 50.  Hb 12.1/HCT 36.5, platelets 86, WBC 4.7.  Hemoglobin A1C 10/10/2018 4:01 PM  Medications and allergies   Allergies  Allergen Reactions   Allopurinol Other (See Comments)    Foot pain, not a rash., 19 Mar 2010    Current Outpatient Medications  Medication Instructions   ACCU-CHEK GUIDE test strip No dose, route, or frequency recorded.   acetaminophen (TYLENOL) 500 mg, Oral, Every 12 hours PRN   atenolol (TENORMIN) 25 mg, Oral, Daily   atorvastatin (LIPITOR) 80 MG tablet 1 tablet, Oral, Daily   fenofibrate (TRICOR) 145 mg, Oral, Daily,     furosemide (LASIX) 40  MG tablet TAKE 1 TABLET BY MOUTH IN THE MORNING.   Lancets (ONETOUCH ULTRASOFT) lancets Test blood sugar twice a day and as needed   metFORMIN (GLUCOPHAGE-XR) 500 mg, Oral, Daily   quinapril (ACCUPRIL) 10 mg, Oral, Daily   Medications after today's encounter Current Outpatient Medications  Medication Instructions   ACCU-CHEK  GUIDE test strip No dose, route, or frequency recorded.   acetaminophen (TYLENOL) 500 mg, Oral, Every 12 hours PRN   atenolol (TENORMIN) 25 mg, Oral, Daily   atorvastatin (LIPITOR) 80 MG tablet 1 tablet, Oral, Daily   fenofibrate (TRICOR) 145 mg, Oral, Daily,     furosemide (LASIX) 40 MG tablet TAKE 1 TABLET BY MOUTH IN THE MORNING.   Lancets (ONETOUCH ULTRASOFT) lancets Test blood sugar twice a day and as needed   metFORMIN (GLUCOPHAGE-XR) 500 mg, Oral, Daily   quinapril (ACCUPRIL) 10 mg, Oral, Daily   Radiology:   No results found.  Cardiac Studies:   Carotid artery duplex  11/21/2018: Minimal stenosis in the right internal carotid artery (minimal).  Stenosis in the right external carotid artery (<50%). Minimal stenosis in the left internal carotid artery (minimal).  Stenosis in the left external carotid artery (<50%). Antegrade right vertebral artery flow. Antegrade left vertebral artery flow. Bilateral external carotid stenosis is the source of bruit.  Follow up studies when clinically indicated.  PCV ECHOCARDIOGRAM COMPLETE 11/04/2020  Narrative Echocardiogram 11/04/2020: Left ventricle cavity is normal in size and wall thickness. Normal global wall motion. Normal LV systolic function with EF 59%. Normal diastolic filling pattern. Mild (Grade I) aortic regurgitation. Mild (Grade I) mitral regurgitation. Previous study on 08/15/2020 reported EF 45-50%, mod MR, mild TR.   PCV MYOCARDIAL PERFUSION WITH LEXISCAN 11/03/2020 Lexiscan nuclear stress test performed using 1-day protocol. Small area of mildly decreased tracer uptake in apical inferior  myocardium, likely due to gut attenuation. All segments of left ventricle demonstrated normal wall motion and thickening. Stress LVEF 81%. Low risk study.   EKG  EKG 02/17/2021: Normal sinus rhythm at rate of 65 bpm, left axis deviation, left anterior fascicular block.  PAC.  IVCD, borderline criteria for LVH.  No significant change from 09/05/2020, previous heart rate was 55 bpm with first-degree AV block.   EKG 07/25/2020: Sinus rhythm with first-degree AV block at rate of 54 bpm, left atrial enlargement, left axis deviation, left anterior fascicular block.  IVCD, LVH.  No significant change from 07/26/2019.   Assessment     ICD-10-CM   1. Chronic diastolic (congestive) heart failure (HCC)  I50.32 EKG 12-Lead    2. Essential hypertension  I10     3. Controlled type 2 diabetes mellitus with stage 4 chronic kidney disease, without long-term current use of insulin (HCC)  E11.22    N18.4        Medications Discontinued During This Encounter  Medication Reason   aspirin (ASPIRIN CHILDRENS) 81 MG chewable tablet    colchicine-probenecid 0.5-500 MG per tablet    quinapril-hydrochlorothiazide (ACCURETIC) 20-12.5 MG per tablet    Finerenone (KERENDIA) 10 MG TABS      No orders of the defined types were placed in this encounter.   Recommendations:   Dale [name]  is a 70 y.o. 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 Donnetta Hutching in [facility].  He has been on flecainide 50 mg twice daily along with atenolol 50 mg daily and diagnosed with severe sleep apnea and has been compliant with CPAP and since then has not had any recurrence.    Patient presents for follow-up of acute on chronic diastolic heart failure.  He has recuperated well  and he has  not had any further recurrence of leg edema, PND or orthopnea and there is no clinical evidence of heart failure today.  Last hospitalization for heart failure was in [facility] on 10/28/2020 with pulmonary edema.  This is a 74-monthoffice visit.  In spite of chronic renal insufficiency, his renal function has been stable since acquired him on Accupril and I had started him on [place] he was tolerating and helped him significantly with regard to heart failure with no change in renal function.  He is now established with nephrology, quinapril and also KCarrington Clampwas discontinued along with colchicine in view of underlying stage IV chronic kidney disease.  As his renal function had not changed in spite of him being on this guideline directed medical therapy, would probably start him back on at least [place] for renal preservation and for heart failure management but would want nephrology to weigh in on this. Agree he should probably stop Metformin in view of advanced renal failure.   I did not make any changes to his medications today, blood pressure is also well controlled, weight loss discussed. I would like to see him back in 3 months for follow-up.   Patient screened into LUX-Sx TRENDS study (loop implantation for heart failure sensor) but does not want to participate.     CC: LHarrie Jeans MD (Nephrology)    JAdrian Prows MD, [facility]12/27/2022, 5:40 PM Office: [phone]: [phone]: [phone]

## 2021-03-09 ENCOUNTER — Ambulatory Visit: Payer: Commercial Managed Care - PPO | Admitting: Cardiology

## 2021-05-04 ENCOUNTER — Other Ambulatory Visit: Payer: Self-pay | Admitting: Cardiology

## 2021-05-04 DIAGNOSIS — I5032 Chronic diastolic (congestive) heart failure: Secondary | ICD-10-CM

## 2021-05-18 ENCOUNTER — Ambulatory Visit: Payer: No Typology Code available for payment source | Admitting: Cardiology

## 2021-05-18 ENCOUNTER — Encounter: Payer: Self-pay | Admitting: Cardiology

## 2021-05-18 ENCOUNTER — Other Ambulatory Visit: Payer: Self-pay

## 2021-05-18 VITALS — BP 118/66 | HR 58 | Temp 98.2°F | Resp 16 | Ht 70.0 in | Wt 231.0 lb

## 2021-05-18 DIAGNOSIS — E1122 Type 2 diabetes mellitus with diabetic chronic kidney disease: Secondary | ICD-10-CM

## 2021-05-18 DIAGNOSIS — K4030 Unilateral inguinal hernia, with obstruction, without gangrene, not specified as recurrent: Secondary | ICD-10-CM

## 2021-05-18 DIAGNOSIS — I5032 Chronic diastolic (congestive) heart failure: Secondary | ICD-10-CM

## 2021-05-18 DIAGNOSIS — I10 Essential (primary) hypertension: Secondary | ICD-10-CM

## 2021-05-18 NOTE — Progress Notes (Signed)
? ?Primary Physician/Referring:  Dale Sacramento, MD ? ?Patient ID: Dale [name], male    DOB: 1950-07-28, 71 y.o.   MRN: [id number] ? ?Chief Complaint  ?Patient presents with  ? Congestive Heart Failure  ? Hypertension  ? Follow-up  ? ? ?HPI:   ? ?Dale [name]  is a 71 y.o. 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 Donnetta Hutching in [facility].  He has been on flecainide 50 mg twice daily along with atenolol 50 mg daily and diagnosed with severe sleep apnea and has been compliant with CPAP and since then has not had any recurrence.  Flecainide was discontinued by me. ? ?Patient presents for follow-up of acute on chronic diastolic heart failure.  This is a 70-monthfollow up visit.  Patient has been careful with his diet but does admit to recent increase in weight.  He has also developed mild leg edema, dyspnea has remained stable, no PND or orthopnea.   He is accompanied by his wife at the bedside.  No chest pain.  Chronic dyspnea is at baseline.   ? ?Past Medical History:  ?Diagnosis Date  ? Arthritis   ? Diabetes mellitus without complication ([place]   ? Epigastric hernia   ? Gout   ? Hyperlipidemia   ? Hypertension   ? Tubular adenoma of colon 12/2010  ? ?Social History  ? ?Tobacco Use  ? Smoking status: Former  ?  Packs/day: 1.50  ?  Years: 10.00  ?  Pack years: 15.00  ?  Types: Cigars, Cigarettes  ?  Quit date: 2000  ?  Years since quitting: 23.2  ? Smokeless tobacco: Former  ?  Types: Chew  ?  Quit date: 2000  ? Tobacco comments:  ?  quite 1979 cigarettes  ?Substance Use Topics  ? Alcohol use: No  ? ?Marital Status: Married  ?ROS  ?Review of Systems  ?Cardiovascular:  Positive for dyspnea on exertion (stable) and leg swelling.  Negative for chest pain and syncope.  ?Respiratory:  Positive for snoring (on CPAP and compliant).   ?Gastrointestinal:  Negative for melena.  ?All other systems reviewed and are negative. ?Objective  ?Blood pressure 118/66, pulse (!) 58, temperature 98.2 ?F (36.8 ?C), temperature source Temporal, resp. rate 16, height 5' 10"  (1.778 m), weight 231 lb (104.8 kg), SpO2 100 %.  ? ?  05/18/2021  ?  1:27 PM 02/17/2021  ?  2:43 PM 11/18/2020  ?  3:15 PM  ?Vitals with BMI  ?Height 5' 10"  5' 10"    ?Weight 231 lbs 229 lbs   ?BMI 33.15 32.86   ?Systolic [id number]  ?Diastolic 66 64 51  ?Pulse 58 66   ?  ? Physical Exam ?Constitutional:   ?   Appearance: He is well-developed.  ?   Comments: Well built and moderately obese in no acute distress  ?Neck:  ?   Vascular: JVD present. No carotid bruit.  ?Cardiovascular:  ?   Rate and Rhythm: Normal rate and regular rhythm.  ?   Pulses: Normal pulses and intact distal pulses.  ?   Heart sounds: Normal heart sounds. No murmur heard. ?  No gallop.  ?Pulmonary:  ?   Effort: Pulmonary effort is normal.  No accessory muscle usage.  ?   Breath sounds: Normal breath sounds.  ?Abdominal:  ?   General: Bowel sounds are normal.  ?   Palpations: Abdomen is soft.  ?   Comments: Obese abdomen  ?Musculoskeletal:  ?   Right lower leg: Edema (2+ pitting) present.  ?   Left lower leg: Edema (2+ pitting) present.  ? ?Laboratory examination:  ? ? ?  Latest Ref Rng & Units 10/31/2020  ? 12:28 PM 08/20/2020  ? 11:11 AM 07/13/2010  ? 10:36 PM  ?CMP  ?Glucose 65 - 99 mg/dL 95   84   117    ?BUN 8 - 27 mg/dL 44   39   29    ?Creatinine 0.76 - 1.27 mg/dL 2.37   1.98   2.30    ?Sodium 134 - 144 mmol/L 142   138   135    ?Potassium 3.5 - 5.2 mmol/L 4.5   4.7   4.1    ?Chloride 96 - 106 mmol/L 108   105   103    ?CO2 20 - 29 mmol/L 18   17     ?Calcium 8.6 - 10.2 mg/dL 8.7   9.2     ? ? ?  Latest Ref Rng & Units 10/31/2020  ? 12:28 PM 07/13/2010  ? 10:36 PM  ?CBC  ?WBC 3.4 - 10.8 x10E3/uL 5.1     ?Hemoglobin 13.0 - 17.7  g/dL 9.6   15.6    ?Hematocrit 37.5 - 51.0 % 29.1   46.0    ?Platelets 150 - 450 x10E3/uL 92     ? ?CrCl cannot be calculated (Patient's most recent lab result is older than the maximum 21 days allowed.). ? ?Lipid Panel  ?No results found for: CHOL, TRIG, HDL, CHOLHDL, VLDL, LDLCALC, LDLDIRECT ?HEMOGLOBIN A1C ?Lab Results  ?Component Value Date  ? HGBA1C 5.8 (H) 08/20/2020  ? ?BNP (last 3 results) ?Recent Labs  ?  08/20/20 ?1111 10/31/20 ?1228  ?BNP 252.2* 412.2*  ? ?Medications and allergies  ? ?Allergies  ?Allergen Reactions  ? Allopurinol Other (See Comments)  ?  Foot pain, not a rash., 19 Mar 2010  ?  ?Medications after today's encounter ? ?Current Outpatient Medications:  ?  ACCU-CHEK GUIDE test strip, , Disp: , Rfl:  ?  acetaminophen (TYLENOL) 500 MG tablet, Take 500 mg by mouth every 12 (twelve) hours as needed., Disp: , Rfl:  ?  atenolol (TENORMIN) 25 MG tablet, Take 1 tablet (25 mg total) by mouth daily., Disp: 90 tablet, Rfl: 3 ?  atorvastatin (LIPITOR) 80 MG tablet, Take 1 tablet by mouth daily., Disp: , Rfl:  ?  fenofibrate (TRICOR) 145 MG tablet, Take 145 mg by mouth daily., Disp: , Rfl:  ?  furosemide (LASIX) 40 MG tablet, TAKE 1 TABLET BY MOUTH EVERY DAY IN THE MORNING (Patient taking differently: Take 20 mg by mouth daily.), Disp: 90 tablet, Rfl: 0 ?  Lancets (ONETOUCH ULTRASOFT) lancets, Test blood sugar twice a day and as needed, Disp: , Rfl:  ?  quinapril (ACCUPRIL) 20 MG tablet, Take 10 mg by mouth daily., Disp: , Rfl:  ?  Vitamin D, Ergocalciferol, (DRISDOL) 1.25 MG (50000 UNIT) CAPS capsule, Take 50,000 Units by mouth once a week., Disp: , Rfl:   ? ?Radiology:  ? ?No results found. ? ?Cardiac Studies:  ? ?Carotid artery duplex  11/21/2018: ?Minimal stenosis in the right internal carotid artery (minimal).  Stenosis in the right external carotid artery (<50%). ?Minimal  stenosis in the left internal carotid artery (minimal).  Stenosis in the left external carotid artery (<50%). ?Antegrade right  vertebral artery flow. Antegrade left vertebral artery flow. ?Bilateral external carotid stenosis is the source of bruit.  Follow up studies when clinically indicated. ? ?PCV ECHOCARDIOGRAM COMPLETE 11/04/2020 ? ?Narrative ?Echocardiogram 11/04/2020: ?Left ventricle cavity is normal in size and wall thickness. Normal global wall motion. Normal LV systolic function with EF 59%. Normal diastolic filling pattern. ?Mild (Grade I) aortic regurgitation. ?Mild (Grade I) mitral regurgitation. ?Previous study on 08/15/2020 reported EF 45-50%, mod MR, mild TR. ? ? ?PCV MYOCARDIAL PERFUSION WITH LEXISCAN 11/03/2020 ?Lexiscan nuclear stress test performed using 1-day protocol. ?Small area of mildly decreased tracer uptake in apical inferior myocardium, likely due to gut attenuation. ?All segments of left ventricle demonstrated normal wall motion and thickening. Stress LVEF 81%. ?Low risk study. ?  ?EKG ? ?EKG 02/17/2021: Normal sinus rhythm at rate of 65 bpm, left axis deviation, left anterior fascicular block.  PAC.  IVCD, borderline criteria for LVH.  No significant change from 09/05/2020, previous heart rate was 55 bpm with first-degree AV block.  ? ?EKG 07/25/2020: Sinus rhythm with first-degree AV block at rate of 54 bpm, left atrial enlargement, left axis deviation, left anterior fascicular block.  IVCD, LVH.  No significant change from 07/26/2019.  ? ?Assessment  ? ?  ICD-10-CM   ?1. Chronic diastolic (congestive) heart failure (HCC)  I50.32   ?  ?2. Controlled type 2 diabetes mellitus with stage 4 chronic kidney disease, without long-term current use of insulin (HCC)  E11.22   ? N18.4   ?  ?3. Essential hypertension  I10   ?  ?4. Irreducible right inguinal hernia  K40.30 Ambulatory referral to General Surgery  ?  ? ?  ?Medications Discontinued During This Encounter  ?Medication Reason  ? metFORMIN (GLUCOPHAGE-XR) 500 MG 24 hr tablet   ?  ?No orders of the defined types were placed in this encounter. ? ?Recommendations:   ? ?PEARCE LITTLEFIELD  is a 71 y.o. Caucasian male with moderate obesity, prior tobacco use quit smoking in 2000, diabetes mellitus with  stage III chronic kidney disease, hypertension, hyperlipidemia,OSA on CP

## 2021-05-22 ENCOUNTER — Ambulatory Visit: Payer: Self-pay | Admitting: Surgery

## 2021-05-22 NOTE — H&P (Signed)
?Subjective  ?  ?Chief Complaint: Hernia ?  ?  ?  ?History of Present Illness: ?Dale [name] is a 71 y.o. male who is seen today as an office consultation at the request of Dr. Einar Gip for evaluation of Hernia ?Marland Kitchen   ?This is a 71 year old male with multiple medical issues including DM2, stage III chronic kidney disease, HTN, hyperlipidemia, OSA on CPAP, atrial fibrillation who presents with several months of a tender right inguinal hernia.  He was recently examined by his cardiologist, who referred him to [place] for hernia repair.  I repaired his left inguinal hernia about 12 years ago.  He has no issues in this area.  However, about a month ago, he began noticing tenderness and swelling in his right groin.  The swelling runs down to his scrotum.  He denies any obstructive symptoms.  His symptoms improve when he is supine or relaxed.  No urinary symptoms.  No recent imaging. ?  ?  ?Review of Systems: ?A complete review of systems was obtained from the patient.  I have reviewed this information and discussed as appropriate with the patient.  See HPI as well for other ROS. ?  ?Review of Systems  ?Constitutional: Negative.   ?HENT: Positive for hearing loss.   ?Eyes: Negative.   ?Respiratory: Negative.   ?Cardiovascular: Positive for leg swelling.  ?Gastrointestinal: Negative.   ?Genitourinary: Negative.   ?Musculoskeletal: Negative.   ?Skin: Negative.   ?Neurological: Negative.   ?Endo/Heme/Allergies: Bruises/bleeds easily.  ?Psychiatric/Behavioral: Negative.   ?  ?  ?  ?Medical History: ?Past Medical History  ?    ?Past Medical History:  ?Diagnosis Date  ? Arthritis    ? CHF (congestive heart failure) (CMS-HCC)    ? Chronic kidney disease    ? Diabetes mellitus without complication (CMS-HCC)    ? Hyperlipidemia    ? Hypertension    ? Sleep apnea    ?  ?  ?  ?   ?Patient Active Problem List  ?Diagnosis  ? CKD (chronic kidney disease) stage 3, GFR 30-59 ml/min (CMS-HCC)  ? Diabetes mellitus type 2, controlled (CMS-HCC)  ?  Essential hypertension  ? History of atrial fibrillation  ? Mixed hyperlipidemia  ? Obesity  ? OSA on CPAP  ? Right inguinal hernia  ?  ?  ?Left inguinal hernia repair with mesh ?  ?Allergies  ?     ?Allergies  ?Allergen Reactions  ? Allopurinol Other (See Comments)  ?    Foot pain, not a rash., 19 Mar 2010 ?Foot pain, not a rash., 19 Mar 2010 ?   ?  ?  ?  ?      ?Current Outpatient Medications on File Prior to Visit  ?Medication Sig Dispense Refill  ? atenoloL (TENORMIN) 25 MG tablet Take 1 tablet by mouth once daily      ? atorvastatin (LIPITOR) 80 MG tablet TAKE 1 TABLET BY MOUTH DAILY TO CONTROL CHOLESTEROL      ? fenofibrate nanocrystallized (TRICOR) 145 MG tablet Take by mouth      ? FUROsemide (LASIX) 40 MG tablet Take 1 tablet by mouth every morning      ? quinapriL (ACCUPRIL) 20 MG tablet Take 10 mg by mouth once daily      ? acetaminophen (TYLENOL) 500 MG tablet Take by mouth      ?  ?No current facility-administered medications on file prior to visit.  ?  ?  ?Family History  ?     ?  Family History  ?Problem Relation Age of Onset  ? High blood pressure (Hypertension) Mother    ? Hyperlipidemia (Elevated cholesterol) Mother    ? Diabetes Mother    ? Breast cancer Mother    ?  ?  ?  ?Social History  ?  ?   ?Tobacco Use  ?Smoking Status Unknown  ?Smokeless Tobacco Not on file  ?  ?  ?Social History  ?Social History  ?  ?    ?Socioeconomic History  ? Marital status: Married  ?Tobacco Use  ? Smoking status: Unknown  ?Vaping Use  ? Vaping Use: Never used  ?Substance and Sexual Activity  ? Alcohol use: Never  ? Drug use: Never  ? Sexual activity: Never  ?  ?  ?  ?Objective:  ?  ?  ?   ?Vitals:  ?  05/22/21 1011  ?BP: 136/88  ?Pulse: 81  ?Temp: 36.9 ?C (98.4 ?F)  ?TempSrc: Temporal  ?SpO2: 98%  ?Weight: (!) 104.4 kg (230 lb 2 oz)  ?Height: 175.3 cm (5' 9" )  ?  ?Body mass index is 33.98 kg/m?. ?  ?Physical Exam  ?  ?Constitutional:  WDWN in NAD, conversant, no obvious deformities; lying in bed comfortably ?Eyes:   Pupils equal, round; sclera anicteric; moist conjunctiva; no lid lag ?HENT:  Oral mucosa moist; good dentition  ?Neck:  No masses palpated, trachea midline; no thyromegaly ?Lungs:  CTA bilaterally; normal respiratory effort ?CV:  Regular rate and rhythm; no murmurs; extremities well-perfused with no edema ?Abd:  +bowel sounds, soft, non-tender, no palpable organomegaly; no palpable umbilical hernia ?GU: No sign of left inguinal hernia, large right inguinal hernia that extends down to the scrotum.  Reducible when he is supine.  No testicular masses ?Musc:  Unable to assess gait; no apparent clubbing or cyanosis in extremities ?Lymphatic:  No palpable cervical or axillary lymphadenopathy ?Skin:  Warm, dry; no sign of jaundice ?Psychiatric - alert and oriented x 4; calm mood and affect ?  ?  ?  ?Assessment and Plan:  ?Diagnoses and all orders for this visit: ?  ?Right inguinal hernia ?  ?  ?  ?Right inguinal hernia repair with mesh.  The surgical procedure has been discussed with the patient.  Potential risks, benefits, alternative treatments, and expected outcomes have been explained.  All of the patient's questions at this time have been answered.  The likelihood of reaching the patient's treatment goal is good.  The patient understand the proposed surgical procedure and wishes to proceed. ?  ?  ?  ?Issaiah Seabrooks Jearld Adjutant, MD  ?05/22/2021 ?10:28 AM ?  ?  ?

## 2021-05-25 ENCOUNTER — Encounter: Payer: Self-pay | Admitting: Cardiology

## 2021-05-29 ENCOUNTER — Other Ambulatory Visit: Payer: Self-pay | Admitting: Cardiology

## 2021-05-29 DIAGNOSIS — I10 Essential (primary) hypertension: Secondary | ICD-10-CM

## 2021-05-29 DIAGNOSIS — Z8679 Personal history of other diseases of the circulatory system: Secondary | ICD-10-CM

## 2021-06-12 NOTE — Pre-Procedure Instructions (Signed)
Surgical Instructions ? ? ? Your procedure is scheduled on Wednesday, May 3rd. ? Report to [facility] Main Entrance "A" at 6:30 A.M., then check in with the Admitting office. ? Call this number if you have problems the morning of surgery: ? [phone] ? ? If you have any questions prior to your surgery date call [phone]: Open Monday-Friday 8am-4pm ? ? ? Remember: ? Do not eat after midnight the night before your surgery ? ?You may drink clear liquids until 5:30 a.m. the morning of your surgery.   ?Clear liquids allowed are: Water, Non-Citrus Juices (without pulp), Carbonated Beverages, Clear Tea, Black Coffee Only (NO MILK, CREAM OR POWDERED CREAMER of any kind), and Gatorade. ?  ? Take these medicines the morning of surgery with A SIP OF WATER  ?acetaminophen (TYLENOL)  ?atorvastatin (LIPITOR)  ?fenofibrate (TRICOR)  ? ?As of today, STOP taking any Aspirin (unless otherwise instructed by your surgeon) Aleve, Naproxen, Ibuprofen, Motrin, Advil, Goody's, BC's, all herbal medications, fish oil, and all vitamins. ? ?HOW TO MANAGE YOUR DIABETES ?BEFORE AND AFTER SURGERY ? ?Why is it important to control my blood sugar before and after surgery? ?Improving blood sugar levels before and after surgery helps healing and can limit problems. ?A way of improving blood sugar control is eating a healthy diet by: ? Eating less sugar and carbohydrates ? Increasing activity/exercise ? Talking with your doctor about reaching your blood sugar goals ?High blood sugars (greater than 180 mg/dL) can raise your risk of infections and slow your recovery, so you will need to focus on controlling your diabetes during the weeks before surgery. ?Make sure that the doctor who takes care of your diabetes knows about your planned surgery including the date and location. ? ?How do I manage my blood sugar before surgery? ?Check your blood sugar at least 4 times a day, starting 2 days before surgery, to make sure that the level is not too high  or low. ? ?Check your blood sugar the morning of your surgery when you wake up and every 2 hours until you get to the Short Stay unit. ? ?If your blood sugar is less than 70 mg/dL, you will need to treat for low blood sugar: ?Do not take insulin. ?Treat a low blood sugar (less than 70 mg/dL) with ? cup of clear juice (cranberry or apple), 4 glucose tablets, OR glucose gel. ?Recheck blood sugar in 15 minutes after treatment (to make sure it is greater than 70 mg/dL). If your blood sugar is not greater than 70 mg/dL on recheck, call [phone] for further instructions. ?Report your blood sugar to the short stay nurse when you get to Short Stay. ? ?If you are admitted to the hospital after surgery: ?Your blood sugar will be checked by the staff and you will probably be given insulin after surgery (instead of oral diabetes medicines) to make sure you have good blood sugar levels. ?The goal for blood sugar control after surgery is 80-180 mg/dL. ? ?         ?           ?Do NOT Smoke (Tobacco/Vaping) for 24 hours prior to your procedure. ? ?If you use a CPAP at night, you may bring your mask/headgear for your overnight stay. ?  ?Contacts, glasses, piercing's, hearing aid's, dentures or partials may not be worn into surgery, please bring cases for these belongings.  ?  ?For patients admitted to the hospital, discharge time will be determined by your treatment team. ?  ?  Patients discharged the day of surgery will not be allowed to drive home, and someone needs to stay with them for 24 hours. ? ?SURGICAL WAITING ROOM VISITATION ?Patients having surgery or a procedure may have two support people in the waiting room. These visitors may be switched out with other visitors if needed. ?Children under the age of 44 must have an adult accompany them who is not the patient. ?If the patient needs to stay at the hospital during part of their recovery, the visitor guidelines for inpatient rooms apply. ? ?Please refer to the [facility]  website for the visitor guidelines for Inpatients (after your surgery is over and you are in a regular room).  ? ? ?Special instructions:   ?[facility]- Preparing For Surgery ? ?Before surgery, you can play an important role. Because skin is not sterile, your skin needs to be as free of germs as possible. You can reduce the number of germs on your skin by washing with CHG (chlorahexidine gluconate) Soap before surgery.  CHG is an antiseptic cleaner which kills germs and bonds with the skin to continue killing germs even after washing.   ? ?Oral Hygiene is also important to reduce your risk of infection.  Remember - BRUSH YOUR TEETH THE MORNING OF SURGERY WITH YOUR REGULAR TOOTHPASTE ? ?Please do not use if you have an allergy to CHG or antibacterial soaps. If your skin becomes reddened/irritated stop using the CHG.  ?Do not shave (including legs and underarms) for at least 48 hours prior to first CHG shower. It is OK to shave your face. ? ?Please follow these instructions carefully. ?  ?Shower the NIGHT BEFORE SURGERY and the MORNING OF SURGERY ? ?If you chose to wash your hair, wash your hair first as usual with your normal shampoo. ? ?After you shampoo, rinse your hair and body thoroughly to remove the shampoo. ? ?Use CHG Soap as you would any other liquid soap. You can apply CHG directly to the skin and wash gently with a scrungie or a clean washcloth.  ? ?Apply the CHG Soap to your body ONLY FROM THE NECK DOWN.  Do not use on open wounds or open sores. Avoid contact with your eyes, ears, mouth and genitals (private parts). Wash Face and genitals (private parts)  with your normal soap.  ? ?Wash thoroughly, paying special attention to the area where your surgery will be performed. ? ?Thoroughly rinse your body with warm water from the neck down. ? ?DO NOT shower/wash with your normal soap after using and rinsing off the CHG Soap. ? ?Pat yourself dry with a CLEAN TOWEL. ? ?Wear CLEAN PAJAMAS to bed the night  before surgery ? ?Place CLEAN SHEETS on your bed the night before your surgery ? ?DO NOT SLEEP WITH PETS. ? ? ?Day of Surgery: ?Take a shower with CHG soap. ?Do not wear jewelry ?Do not wear lotions, powders, colognes, or deodorant. ?Men may shave face and neck. ?Do not bring valuables to the hospital.  ?[facility] is not responsible for any belongings or valuables. ?Wear Clean/Comfortable clothing the morning of surgery ?Remember to brush your teeth WITH YOUR REGULAR TOOTHPASTE. ?  ?Please read over the following fact sheets that you were given. ? ? ? ?If you received a COVID test during your pre-op visit  it is requested that you wear a mask when out in public, stay away from anyone that may not be feeling well and notify your surgeon if you develop symptoms. If you  have been in contact with anyone that has tested positive in the last 10 days please notify you surgeon. ? ?

## 2021-06-15 ENCOUNTER — Encounter (HOSPITAL_COMMUNITY): Payer: Self-pay

## 2021-06-15 ENCOUNTER — Other Ambulatory Visit: Payer: Self-pay

## 2021-06-15 ENCOUNTER — Encounter (HOSPITAL_COMMUNITY)
Admission: RE | Admit: 2021-06-15 | Discharge: 2021-06-15 | Disposition: A | Discharge status: Home / Self Care | Payer: No Typology Code available for payment source | Source: Ambulatory Visit | Attending: Surgery | Admitting: Surgery

## 2021-06-15 VITALS — BP 130/71 | HR 78 | Temp 97.5°F | Resp 17 | Ht 69.0 in | Wt 220.5 lb

## 2021-06-15 DIAGNOSIS — G4733 Obstructive sleep apnea (adult) (pediatric): Secondary | ICD-10-CM | POA: Diagnosis not present

## 2021-06-15 DIAGNOSIS — K4030 Unilateral inguinal hernia, with obstruction, without gangrene, not specified as recurrent: Secondary | ICD-10-CM | POA: Insufficient documentation

## 2021-06-15 DIAGNOSIS — Z01818 Encounter for other preprocedural examination: Secondary | ICD-10-CM

## 2021-06-15 DIAGNOSIS — Z9989 Dependence on other enabling machines and devices: Secondary | ICD-10-CM | POA: Insufficient documentation

## 2021-06-15 DIAGNOSIS — I480 Paroxysmal atrial fibrillation: Secondary | ICD-10-CM | POA: Insufficient documentation

## 2021-06-15 DIAGNOSIS — I2510 Atherosclerotic heart disease of native coronary artery without angina pectoris: Secondary | ICD-10-CM | POA: Insufficient documentation

## 2021-06-15 DIAGNOSIS — I5032 Chronic diastolic (congestive) heart failure: Secondary | ICD-10-CM | POA: Insufficient documentation

## 2021-06-15 DIAGNOSIS — Z01812 Encounter for preprocedural laboratory examination: Secondary | ICD-10-CM | POA: Insufficient documentation

## 2021-06-15 DIAGNOSIS — N184 Chronic kidney disease, stage 4 (severe): Secondary | ICD-10-CM | POA: Insufficient documentation

## 2021-06-15 DIAGNOSIS — E785 Hyperlipidemia, unspecified: Secondary | ICD-10-CM | POA: Diagnosis not present

## 2021-06-15 DIAGNOSIS — E119 Type 2 diabetes mellitus without complications: Secondary | ICD-10-CM

## 2021-06-15 DIAGNOSIS — I130 Hypertensive heart and chronic kidney disease with heart failure and stage 1 through stage 4 chronic kidney disease, or unspecified chronic kidney disease: Secondary | ICD-10-CM | POA: Insufficient documentation

## 2021-06-15 DIAGNOSIS — E1122 Type 2 diabetes mellitus with diabetic chronic kidney disease: Secondary | ICD-10-CM | POA: Diagnosis not present

## 2021-06-15 HISTORY — DX: Cardiac arrhythmia, unspecified: I49.9

## 2021-06-15 HISTORY — DX: Chronic kidney disease, unspecified: N18.9

## 2021-06-15 HISTORY — DX: Heart failure, unspecified: I50.9

## 2021-06-15 HISTORY — DX: Sleep apnea, unspecified: G47.30

## 2021-06-15 LAB — BASIC METABOLIC PANEL
Anion gap: 6 (ref 5–15)
BUN: 24 mg/dL — ABNORMAL HIGH (ref 8–23)
CO2: 21 mmol/L — ABNORMAL LOW (ref 22–32)
Calcium: 8.8 mg/dL — ABNORMAL LOW (ref 8.9–10.3)
Chloride: 111 mmol/L (ref 98–111)
Creatinine, Ser: 1.61 mg/dL — ABNORMAL HIGH (ref 0.61–1.24)
GFR, Estimated: 46 mL/min — ABNORMAL LOW (ref >60)
Glucose, Bld: 171 mg/dL — ABNORMAL HIGH (ref 70–99)
Potassium: 4 mmol/L (ref 3.5–5.1)
Sodium: 138 mmol/L (ref 135–145)

## 2021-06-15 LAB — CBC
HCT: 31.7 % — ABNORMAL LOW (ref 39.0–52.0)
Hemoglobin: 10.3 g/dL — ABNORMAL LOW (ref 13.0–17.0)
MCH: 33.2 pg (ref 26.0–34.0)
MCHC: 32.5 g/dL (ref 30.0–36.0)
MCV: 102.3 fL — ABNORMAL HIGH (ref 80.0–100.0)
Platelets: 70 10*3/uL — ABNORMAL LOW (ref 150–400)
RBC: 3.1 MIL/uL — ABNORMAL LOW (ref 4.22–5.81)
RDW: 14.6 % (ref 11.5–15.5)
WBC: 3.4 10*3/uL — ABNORMAL LOW (ref 4.0–10.5)
nRBC: 0 % (ref 0.0–0.2)

## 2021-06-15 LAB — HEMOGLOBIN A1C
Hgb A1c MFr Bld: 6.4 % — ABNORMAL HIGH (ref 4.8–5.6)
Mean Plasma Glucose: 136.98 mg/dL

## 2021-06-15 LAB — GLUCOSE, CAPILLARY: Glucose-Capillary: 189 mg/dL — ABNORMAL HIGH (ref 70–99)

## 2021-06-15 NOTE — Progress Notes (Signed)
PCP - Dr. Kathryne Eriksson ?Cardiologist - Dr. Einar Gip ? ?PPM/ICD - n/a ? ?Chest x-ray - n/a ?EKG - 02/17/21 ?Stress Test - 11/04/20 ?ECHO - 11/09/20 ?Cardiac Cath - denies ? ?Sleep Study - OSA+ ?CPAP - uses nightly  ? ?Fasting Blood Sugar - 104-115 ?Checks Blood Sugar 1 time a day ? ?Blood Thinner Instructions: n/a ?Aspirin Instructions: n/a ? ?ERAS Protcol - Clear liquids until 0530 DOS ?PRE-SURGERY Ensure or G2- none ordered. ? ?COVID TEST- N/a ? ?Anesthesia review: Yes, heart history ? ?Patient denies shortness of breath, fever, cough and chest pain at PAT appointment ? ? ?All instructions explained to the patient, with a verbal understanding of the material. Patient agrees to go over the instructions while at home for a better understanding. Patient also instructed to self quarantine after being tested for COVID-19. The opportunity to ask questions was provided. ? ? ?

## 2021-06-16 NOTE — Anesthesia Preprocedure Evaluation (Addendum)
Anesthesia Evaluation  ?Patient identified by MRN, date of birth, ID band ?Patient awake ? ? ? ?Reviewed: ?Allergy & Precautions, NPO status , Patient's Chart, lab work & pertinent test results ? ?Airway ?Mallampati: III ? ?TM Distance: >3 FB ?Neck ROM: Full ? ? ? Dental ?no notable dental hx. ? ?  ?Pulmonary ?sleep apnea and Continuous Positive Airway Pressure Ventilation , former smoker,  ?  ?Pulmonary exam normal ? ? ? ? ? ? ? Cardiovascular ?hypertension, Pt. on medications ?+CHF  ?Normal cardiovascular exam+ dysrhythmias  ? ?ECHO: ?1. Left ventricle cavity is normal in size and wall thickness. ?Normal global wall motion. ?Normal LV systolic function with EF 59%. ?Normal diastolic filling pattern. ?2. Left atrial cavity is normal in size. ?3. Right atrial cavity is normal in size. ?4. Right ventricle cavity is normal in size. ?Normal right ventricular function. ?5. Structurally normal trileaflet aortic valve. No evidence of ?aortic stenosis. Mild (Grade I) aortic regurgitation. ?6. Structurally normal mitral valve. No evidence of mitral ?stenosis. Mild (Grade I) mitral regurgitation. ?7. Structurally normal tricuspid valve with no regurgitation. ?No evidence of tricuspid valve stenosis. ?8. Structurally normal pulmonic valve with no regurgitation. ?No evidence of pulmonic valve stenosis. ?9. No evidence of significant pericardial effusion. ?10. The aortic root is normal. ?11. Normal pulmonary artery. ?12. IVC is normal with respiratory variation. ?The IVC is adequately visualized. ?Normal right atrial pressure. ?  ?Neuro/Psych ?negative neurological ROS ? negative psych ROS  ? GI/Hepatic ?negative GI ROS, Neg liver ROS,   ?Endo/Other  ?diabetes ? Renal/GU ?Renal InsufficiencyRenal disease  ? ?  ?Musculoskeletal ? ?(+) Arthritis ,  ? Abdominal ?(+) + obese,   ?Peds ? Hematology ? ?(+) Blood dyscrasia, anemia , Thrombocytopenia    ?Anesthesia Other Findings ?right inguinal  hernia ? Reproductive/Obstetrics ? ?  ? ? ? ? ? ? ? ? ? ? ? ? ? ?  ?  ? ? ? ? ? ?Anesthesia Physical ?Anesthesia Plan ? ?ASA: 3 ? ?Anesthesia Plan: Regional and General  ? ?Post-op Pain Management:   ? ?Induction: Intravenous ? ?PONV Risk Score and Plan: 2 and Ondansetron, Dexamethasone, Midazolam and Treatment may vary due to age or medical condition ? ?Airway Management Planned: LMA ? ?Additional Equipment:  ? ?Intra-op Plan:  ? ?Post-operative Plan: Extubation in OR ? ?Informed Consent: I have reviewed the patients History and Physical, chart, labs and discussed the procedure including the risks, benefits and alternatives for the proposed anesthesia with the patient or authorized representative who has indicated his/her understanding and acceptance.  ? ? ? ?Dental advisory given ? ?Plan Discussed with: CRNA ? ?Anesthesia Plan Comments: (PAT note by Karoline Caldwell, PA-C: ? ?Follows with cardiology for history of HTN, HLD, OSA on CPAP, paroxysmal A-fib, HFpEF.  Last seen by Dr. Einar Gip 05/18/2021 and at that time was urgently referred to general surgery for a reducible right inguinal hernia.  Per note, "In spite of chronic renal insufficiency, his renal function has been stable stage IV chronic kidney disease.??He has developed a irreducible right inguinal hernia with mild tenderness that started about a month ago. ?I have discussed with him regarding acute complications, urgent referral made to surgery and they have accepted to see him within the next 1 week. ?I personally communicated his presentation with Dr. Georgette Dover."  Clearance was also formally stated in letter dated 05/25/2021, "[name]?is at low risk, from a cardiac standpoint, for his?upcoming procedure: right inguinal hernia repair. ?It is ok to proceed without further cardiac  testing. If applicable can hold asa?for 7??day(s) prior to procedure and re-start when appropriate ?days post procedure." ? ?Non-insulin-dependent DM2, A1c 6.4 on preop labs. ? ?CKD  4.  Followed by nephrologist Dr. Royce Macadamia. ? ?Preop labs reviewed, creatinine elevated 1.61 consistent with history of CKD, mild anemia with hemoglobin 10.3, platelets low at 70k (appears to be near baseline per review of labs available in epic/Care Everywhere). ? ?EKG 02/17/2021: Normal sinus rhythm at rate of 65 bpm, left axis deviation, left anterior fascicular block. ?PAC. ?IVCD, borderline criteria for LVH. ?No significant change from 09/05/2020 ? ?Echocardiogram 11/04/2020: ?Left ventricle cavity is normal in size and wall thickness. Normal global wall motion. Normal LV systolic function with EF 59%. Normal diastolic filling pattern. ?Mild (Grade I) aortic regurgitation. ?Mild (Grade I) mitral regurgitation. ?Previous study on 08/15/2020 reported EF 45-50%, mod MR, mild TR. ?? ?? ?PCV MYOCARDIAL PERFUSION WITH LEXISCAN 11/03/2020 ?Lexiscan nuclear stress test performed using 1-day protocol. ?Small area of mildly decreased tracer uptake in apical inferior myocardium, likely due to gut attenuation. ?All segments of left ventricle demonstrated normal wall motion and thickening. Stress LVEF 81%. ?Low risk study. ?)  ? ? ? ? ?Anesthesia Quick Evaluation ? ?

## 2021-06-16 NOTE — Progress Notes (Signed)
Anesthesia Chart Review: ? ?Follows with cardiology for history of HTN, HLD, OSA on CPAP, paroxysmal A-fib, HFpEF.  Last seen by Dr. Einar Gip 05/18/2021 and at that time was urgently referred to general surgery for a reducible right inguinal hernia.  Per note, "In spite of chronic renal insufficiency, his renal function has been stable stage IV chronic kidney disease.  He has developed a irreducible right inguinal hernia with mild tenderness that started about a month ago.  I have discussed with him regarding acute complications, urgent referral made to surgery and they have accepted to see him within the next 1 week.  I personally communicated his presentation with Dr. Georgette Dover."  Clearance was also formally stated in letter dated 05/25/2021, "Dale [name] is at low risk, from a cardiac standpoint, for his upcoming procedure: right inguinal hernia repair.  It is ok to proceed without further cardiac testing. If applicable can hold asa for 7  day(s) prior to procedure and re-start when appropriate  days post procedure." ? ?Non-insulin-dependent DM2, A1c 6.4 on preop labs. ? ?CKD 4.  Followed by nephrologist Dr. Royce Macadamia. ? ?Preop labs reviewed, creatinine elevated 1.61 consistent with history of CKD, mild anemia with hemoglobin 10.3, platelets low at 70k (appears to be near baseline per review of labs available in epic/Care Everywhere). ? ?EKG 02/17/2021: Normal sinus rhythm at rate of 65 bpm, left axis deviation, left anterior fascicular block.  PAC.  IVCD, borderline criteria for LVH.  No significant change from 09/05/2020 ? ?Echocardiogram 11/04/2020: ?Left ventricle cavity is normal in size and wall thickness. Normal global wall motion. Normal LV systolic function with EF 59%. Normal diastolic filling pattern. ?Mild (Grade I) aortic regurgitation. ?Mild (Grade I) mitral regurgitation. ?Previous study on 08/15/2020 reported EF 45-50%, mod MR, mild TR. ?  ?  ?PCV MYOCARDIAL PERFUSION WITH LEXISCAN 11/03/2020 ?Lexiscan  nuclear stress test performed using 1-day protocol. ?Small area of mildly decreased tracer uptake in apical inferior myocardium, likely due to gut attenuation. ?All segments of left ventricle demonstrated normal wall motion and thickening. Stress LVEF 81%. ?Low risk study. ? ? ? ?Karoline Caldwell, PA-C ?[facility]/Anesthesiology ?Phone [phone] ?06/16/2021 4:28 PM ? ?

## 2021-06-24 ENCOUNTER — Other Ambulatory Visit: Payer: Self-pay

## 2021-06-24 ENCOUNTER — Encounter (HOSPITAL_COMMUNITY): Payer: Self-pay | Admitting: Surgery

## 2021-06-24 ENCOUNTER — Ambulatory Visit (HOSPITAL_COMMUNITY)
Admission: RE | Admit: 2021-06-24 | Discharge: 2021-06-24 | Disposition: A | Discharge status: Home / Self Care | Payer: No Typology Code available for payment source | Source: Ambulatory Visit | Attending: Surgery | Admitting: Surgery

## 2021-06-24 ENCOUNTER — Encounter (HOSPITAL_COMMUNITY)
Admission: RE | Disposition: A | Discharge status: Home / Self Care | Payer: Self-pay | Source: Ambulatory Visit | Attending: Surgery

## 2021-06-24 ENCOUNTER — Ambulatory Visit (HOSPITAL_COMMUNITY): Payer: No Typology Code available for payment source | Admitting: Physician Assistant

## 2021-06-24 ENCOUNTER — Ambulatory Visit (HOSPITAL_BASED_OUTPATIENT_CLINIC_OR_DEPARTMENT_OTHER): Payer: No Typology Code available for payment source | Admitting: Anesthesiology

## 2021-06-24 DIAGNOSIS — E669 Obesity, unspecified: Secondary | ICD-10-CM | POA: Insufficient documentation

## 2021-06-24 DIAGNOSIS — G4733 Obstructive sleep apnea (adult) (pediatric): Secondary | ICD-10-CM | POA: Insufficient documentation

## 2021-06-24 DIAGNOSIS — Z6833 Body mass index (BMI) 33.0-33.9, adult: Secondary | ICD-10-CM | POA: Diagnosis not present

## 2021-06-24 DIAGNOSIS — I509 Heart failure, unspecified: Secondary | ICD-10-CM | POA: Diagnosis not present

## 2021-06-24 DIAGNOSIS — K4090 Unilateral inguinal hernia, without obstruction or gangrene, not specified as recurrent: Secondary | ICD-10-CM

## 2021-06-24 DIAGNOSIS — E1122 Type 2 diabetes mellitus with diabetic chronic kidney disease: Secondary | ICD-10-CM | POA: Insufficient documentation

## 2021-06-24 DIAGNOSIS — N183 Chronic kidney disease, stage 3 unspecified: Secondary | ICD-10-CM | POA: Diagnosis not present

## 2021-06-24 DIAGNOSIS — I4891 Unspecified atrial fibrillation: Secondary | ICD-10-CM | POA: Diagnosis not present

## 2021-06-24 DIAGNOSIS — I110 Hypertensive heart disease with heart failure: Secondary | ICD-10-CM | POA: Diagnosis not present

## 2021-06-24 DIAGNOSIS — I130 Hypertensive heart and chronic kidney disease with heart failure and stage 1 through stage 4 chronic kidney disease, or unspecified chronic kidney disease: Secondary | ICD-10-CM | POA: Diagnosis not present

## 2021-06-24 DIAGNOSIS — E782 Mixed hyperlipidemia: Secondary | ICD-10-CM | POA: Insufficient documentation

## 2021-06-24 HISTORY — PX: INSERTION OF MESH: SHX5868

## 2021-06-24 HISTORY — PX: INGUINAL HERNIA REPAIR: SHX194

## 2021-06-24 LAB — GLUCOSE, CAPILLARY
Glucose-Capillary: 103 mg/dL — ABNORMAL HIGH (ref 70–99)
Glucose-Capillary: 91 mg/dL (ref 70–99)

## 2021-06-24 SURGERY — REPAIR, HERNIA, INGUINAL, ADULT
Anesthesia: Regional | Site: Groin | Laterality: Right

## 2021-06-24 MED ORDER — EPHEDRINE SULFATE-NACL 50-0.9 MG/10ML-% IV SOSY
PREFILLED_SYRINGE | INTRAVENOUS | Status: DC | PRN
  Administered 2021-06-24: 10 mg via INTRAVENOUS

## 2021-06-24 MED ORDER — LACTATED RINGERS IV SOLN: INTRAVENOUS | Status: DC

## 2021-06-24 MED ORDER — PROPOFOL 10 MG/ML IV BOLUS
INTRAVENOUS | Status: DC | PRN
  Administered 2021-06-24: 150 mg via INTRAVENOUS

## 2021-06-24 MED ORDER — INSULIN ASPART 100 UNIT/ML IJ SOLN: 0.0000 [IU] | INTRAMUSCULAR | Status: DC | PRN

## 2021-06-24 MED ORDER — ESMOLOL HCL 100 MG/10ML IV SOLN
INTRAVENOUS | Status: DC | PRN
  Administered 2021-06-24: 30 mg via INTRAVENOUS

## 2021-06-24 MED ORDER — ROPIVACAINE HCL 5 MG/ML IJ SOLN
INTRAMUSCULAR | Status: DC | PRN
  Administered 2021-06-24: 30 mL via PERINEURAL

## 2021-06-24 MED ORDER — PHENYLEPHRINE 80 MCG/ML (10ML) SYRINGE FOR IV PUSH (FOR BLOOD PRESSURE SUPPORT)
PREFILLED_SYRINGE | INTRAVENOUS | Status: DC | PRN
Start: 2021-06-24 — End: 2021-06-24
  Administered 2021-06-24 (×6): 80 ug via INTRAVENOUS

## 2021-06-24 MED ORDER — OXYCODONE HCL 5 MG PO TABS
5.0000 mg | ORAL_TABLET | Freq: Once | ORAL | Status: AC
  Administered 2021-06-24: 5 mg via ORAL

## 2021-06-24 MED ORDER — AMISULPRIDE (ANTIEMETIC) 5 MG/2ML IV SOLN: 10.0000 mg | Freq: Once | INTRAVENOUS | Status: DC | PRN

## 2021-06-24 MED ORDER — SUGAMMADEX SODIUM 200 MG/2ML IV SOLN
INTRAVENOUS | Status: DC | PRN
  Administered 2021-06-24: 386.4 mg via INTRAVENOUS

## 2021-06-24 MED ORDER — CEFAZOLIN SODIUM-DEXTROSE 2-4 GM/100ML-% IV SOLN
2.0000 g | INTRAVENOUS | Status: AC
  Administered 2021-06-24: 2 g via INTRAVENOUS
  Filled 2021-06-24: qty 100

## 2021-06-24 MED ORDER — ONDANSETRON HCL 4 MG/2ML IJ SOLN: 4.0000 mg | Freq: Once | INTRAMUSCULAR | Status: DC | PRN

## 2021-06-24 MED ORDER — BUPIVACAINE-EPINEPHRINE 0.25% -1:200000 IJ SOLN
INTRAMUSCULAR | Status: DC | PRN
  Administered 2021-06-24: 10 mL

## 2021-06-24 MED ORDER — PROPOFOL 10 MG/ML IV BOLUS
INTRAVENOUS | Status: AC
  Filled 2021-06-24: qty 20

## 2021-06-24 MED ORDER — CHLORHEXIDINE GLUCONATE CLOTH 2 % EX PADS: 6.0000 | MEDICATED_PAD | Freq: Once | CUTANEOUS | Status: DC

## 2021-06-24 MED ORDER — FENTANYL CITRATE (PF) 250 MCG/5ML IJ SOLN
INTRAMUSCULAR | Status: DC | PRN
  Administered 2021-06-24: 50 ug via INTRAVENOUS
  Administered 2021-06-24: 100 ug via INTRAVENOUS

## 2021-06-24 MED ORDER — OXYCODONE HCL 5 MG PO TABS: 5.0000 mg | ORAL_TABLET | Freq: Four times a day (QID) | ORAL | 0 refills | Status: DC | PRN

## 2021-06-24 MED ORDER — FENTANYL CITRATE (PF) 100 MCG/2ML IJ SOLN: 25.0000 ug | INTRAMUSCULAR | Status: DC | PRN

## 2021-06-24 MED ORDER — DEXAMETHASONE SODIUM PHOSPHATE 10 MG/ML IJ SOLN
INTRAMUSCULAR | Status: DC | PRN
  Administered 2021-06-24: 4 mg via INTRAVENOUS

## 2021-06-24 MED ORDER — BUPIVACAINE-EPINEPHRINE (PF) 0.25% -1:200000 IJ SOLN
INTRAMUSCULAR | Status: AC
  Filled 2021-06-24: qty 30

## 2021-06-24 MED ORDER — ONDANSETRON HCL 4 MG/2ML IJ SOLN
INTRAMUSCULAR | Status: DC | PRN
  Administered 2021-06-24 (×2): 4 mg via INTRAVENOUS

## 2021-06-24 MED ORDER — OXYCODONE HCL 5 MG PO TABS
ORAL_TABLET | ORAL | Status: AC
  Filled 2021-06-24: qty 1

## 2021-06-24 MED ORDER — OXYCODONE-ACETAMINOPHEN 5-325 MG PO TABS
ORAL_TABLET | ORAL | Status: AC
  Filled 2021-06-24: qty 1

## 2021-06-24 MED ORDER — ROCURONIUM BROMIDE 100 MG/10ML IV SOLN
INTRAVENOUS | Status: DC | PRN
Start: 2021-06-24 — End: 2021-06-24
  Administered 2021-06-24: 50 mg via INTRAVENOUS
  Administered 2021-06-24: 20 mg via INTRAVENOUS

## 2021-06-24 MED ORDER — CHLORHEXIDINE GLUCONATE 0.12 % MT SOLN
15.0000 mL | Freq: Once | OROMUCOSAL | Status: AC
  Administered 2021-06-24: 15 mL via OROMUCOSAL
  Filled 2021-06-24: qty 15

## 2021-06-24 MED ORDER — ACETAMINOPHEN 500 MG PO TABS
1000.0000 mg | ORAL_TABLET | ORAL | Status: DC
  Filled 2021-06-24: qty 2

## 2021-06-24 MED ORDER — LIDOCAINE 2% (20 MG/ML) 5 ML SYRINGE
INTRAMUSCULAR | Status: DC | PRN
  Administered 2021-06-24: 60 mg via INTRAVENOUS

## 2021-06-24 MED ORDER — ORAL CARE MOUTH RINSE: 15.0000 mL | Freq: Once | OROMUCOSAL | Status: AC

## 2021-06-24 MED ORDER — FENTANYL CITRATE (PF) 250 MCG/5ML IJ SOLN
INTRAMUSCULAR | Status: AC
  Filled 2021-06-24: qty 5

## 2021-06-24 MED ORDER — 0.9 % SODIUM CHLORIDE (POUR BTL) OPTIME
TOPICAL | Status: DC | PRN
  Administered 2021-06-24: 1000 mL

## 2021-06-24 SURGICAL SUPPLY — 42 items
APL PRP STRL LF DISP 70% ISPRP (MISCELLANEOUS) ×1
APL SKNCLS STERI-STRIP NONHPOA (GAUZE/BANDAGES/DRESSINGS) ×1
BENZOIN TINCTURE PRP APPL 2/3 (GAUZE/BANDAGES/DRESSINGS) ×3 IMPLANT
BLADE CLIPPER SURG (BLADE) ×1 IMPLANT
CHLORAPREP W/TINT 26 (MISCELLANEOUS) ×3 IMPLANT
COVER SURGICAL LIGHT HANDLE (MISCELLANEOUS) ×3 IMPLANT
DRAIN PENROSE 1/2X12 LTX STRL (WOUND CARE) ×1 IMPLANT
DRAPE LAPAROTOMY TRNSV 102X78 (DRAPES) ×1 IMPLANT
DRSG TEGADERM 4X4.75 (GAUZE/BANDAGES/DRESSINGS) ×3 IMPLANT
ELECT CAUTERY BLADE 6.4 (BLADE) ×1 IMPLANT
ELECT REM PT RETURN 9FT ADLT (ELECTROSURGICAL) ×2
ELECTRODE REM PT RTRN 9FT ADLT (ELECTROSURGICAL) ×2 IMPLANT
GAUZE 4X4 16PLY ~~LOC~~+RFID DBL (SPONGE) ×3 IMPLANT
GAUZE SPONGE 4X4 12PLY STRL (GAUZE/BANDAGES/DRESSINGS) ×3 IMPLANT
GLOVE BIO SURGEON STRL SZ7 (GLOVE) ×3 IMPLANT
GLOVE BIOGEL PI IND STRL 7.5 (GLOVE) ×2 IMPLANT
GLOVE BIOGEL PI INDICATOR 7.5 (GLOVE) ×1
GOWN STRL REUS W/ TWL LRG LVL3 (GOWN DISPOSABLE) ×4 IMPLANT
GOWN STRL REUS W/TWL LRG LVL3 (GOWN DISPOSABLE) ×4
KIT BASIN OR (CUSTOM PROCEDURE TRAY) ×3 IMPLANT
KIT TURNOVER KIT B (KITS) ×3 IMPLANT
MESH PARIETEX PROGRIP RIGHT (Mesh General) ×1 IMPLANT
NDL HYPO 25GX1X1/2 BEV (NEEDLE) ×2 IMPLANT
NEEDLE HYPO 25GX1X1/2 BEV (NEEDLE) ×2 IMPLANT
NS IRRIG 1000ML POUR BTL (IV SOLUTION) ×3 IMPLANT
PACK GENERAL/GYN (CUSTOM PROCEDURE TRAY) ×3 IMPLANT
PAD ARMBOARD 7.5X6 YLW CONV (MISCELLANEOUS) ×3 IMPLANT
PENCIL SMOKE EVACUATOR (MISCELLANEOUS) ×3 IMPLANT
SPONGE INTESTINAL PEANUT (DISPOSABLE) ×3 IMPLANT
STRIP CLOSURE SKIN 1/2X4 (GAUZE/BANDAGES/DRESSINGS) ×3 IMPLANT
SUT MNCRL AB 4-0 PS2 18 (SUTURE) ×3 IMPLANT
SUT SILK 2 0 SH (SUTURE) ×1 IMPLANT
SUT SILK 3 0 (SUTURE) ×2
SUT SILK 3-0 18XBRD TIE 12 (SUTURE) IMPLANT
SUT VIC AB 0 CT2 27 (SUTURE) ×3 IMPLANT
SUT VIC AB 2-0 SH 27 (SUTURE) ×2
SUT VIC AB 2-0 SH 27X BRD (SUTURE) ×2 IMPLANT
SUT VIC AB 3-0 SH 27 (SUTURE) ×2
SUT VIC AB 3-0 SH 27XBRD (SUTURE) ×2 IMPLANT
SYR CONTROL 10ML LL (SYRINGE) ×3 IMPLANT
TOWEL GREEN STERILE (TOWEL DISPOSABLE) ×3 IMPLANT
TOWEL GREEN STERILE FF (TOWEL DISPOSABLE) ×3 IMPLANT

## 2021-06-24 NOTE — Anesthesia Postprocedure Evaluation (Signed)
Anesthesia Post Note ? ?Patient: Dale [name] ? ?Procedure(s) Performed: RIGHT INGUINAL HERNIA REPAIR (Right: Groin) ?INSERTION OF MESH (Right: Groin) ? ?  ? ?Patient location during evaluation: PACU ?Anesthesia Type: Regional and General ?Level of consciousness: awake ?Pain management: pain level controlled ?Vital Signs Assessment: post-procedure vital signs reviewed and stable ?Respiratory status: spontaneous breathing, nonlabored ventilation, respiratory function stable and patient connected to nasal cannula oxygen ?Cardiovascular status: blood pressure returned to baseline and stable ?Postop Assessment: no apparent nausea or vomiting ?Anesthetic complications: no ? ? ?No notable events documented. ? ?Last Vitals:  ?Vitals:  ? 06/24/21 1037 06/24/21 1040  ?BP:  (!) 143/63  ?Pulse: 78 77  ?Resp: 17 15  ?Temp:    ?SpO2:  100%  ?  ?Last Pain:  ?Vitals:  ? 06/24/21 1025  ?TempSrc:   ?PainSc: 3   ? ? ?  ?  ?  ?  ?  ?  ? ?Marah Park P Jarae Nemmers ? ? ? ? ?

## 2021-06-24 NOTE — Anesthesia Procedure Notes (Signed)
Procedure Name: Intubation ?Date/Time: 06/24/2021 8:34 AM ?Performed by: Reeves Dam, CRNA ?Pre-anesthesia Checklist: Patient identified, Emergency Drugs available, Suction available and Patient being monitored ?Patient Re-evaluated:Patient Re-evaluated prior to induction ?Oxygen Delivery Method: Circle system utilized ?Preoxygenation: Pre-oxygenation with 100% oxygen ?Induction Type: IV induction ?Ventilation: Mask ventilation without difficulty ?Grade View: Grade I ?Tube type: Oral ?Tube size: 7.5 mm ?Number of attempts: 1 ?Airway Equipment and Method: Stylet and Oral airway ?Placement Confirmation: ETT inserted through vocal cords under direct vision, positive ETCO2 and breath sounds checked- equal and bilateral ?Secured at: 22 cm ?Tube secured with: Tape ?Dental Injury: Teeth and Oropharynx as per pre-operative assessment  ? ? ? ? ?

## 2021-06-24 NOTE — Discharge Instructions (Addendum)
[facility] ? ?UMBILICAL OR INGUINAL HERNIA REPAIR: POST OP INSTRUCTIONS ? ?Always review your discharge instruction sheet given to you by the facility where your surgery was performed. ?IF YOU HAVE DISABILITY OR FAMILY LEAVE FORMS, YOU MUST BRING THEM TO THE OFFICE FOR PROCESSING.   ?DO NOT GIVE THEM TO YOUR DOCTOR. ? ?A  prescription for pain medication may be given to you upon discharge.  Take your pain medication as prescribed, if needed.  If narcotic pain medicine is not needed, then you may take acetaminophen (Tylenol) or ibuprofen (Advil) as needed. ?Take your usually prescribed medications unless otherwise directed. ?If you need a refill on your pain medication, please contact your pharmacy.  They will contact our office to request authorization. Prescriptions will not be filled after 5 pm or on week-ends. ?You should follow a light diet the first 24 hours after arrival home, such as soup and crackers, etc.  Be sure to include lots of fluids daily.  Resume your normal diet the day after surgery. ?Most patients will experience some swelling and bruising around the umbilicus or in the groin and scrotum.  Ice packs and reclining will help.  Swelling and bruising can take several days to resolve.  ?It is common to experience some constipation if taking pain medication after surgery.  Increasing fluid intake and taking a stool softener (such as Colace) will usually help or prevent this problem from occurring.  A mild laxative (Milk of Magnesia or Miralax) should be taken according to package directions if there are no bowel movements after 48 hours. ?Unless discharge instructions indicate otherwise, you may remove your bandages 24-48 hours after surgery, and you may shower at that time.  You will have steri-strips (small skin tapes) in place directly over the incision.  These strips should be left on the skin for 7-10 days. ?ACTIVITIES:  You may resume regular (light) daily activities beginning  the next day--such as daily self-care, walking, climbing stairs--gradually increasing activities as tolerated.  You may have sexual intercourse when it is comfortable.  Refrain from any heavy lifting or straining until approved by your doctor. ?You may drive when you are no longer taking prescription pain medication, you can comfortably wear a seatbelt, and you can safely maneuver your car and apply brakes. ?RETURN TO WORK:  2-3 weeks with light duty - no lifting over 15 lbs. ?You should see your doctor in the office for a follow-up appointment approximately 2-3 weeks after your surgery.  Make sure that you call for this appointment within a day or two after you arrive home to insure a convenient appointment time. ?OTHER INSTRUCTIONS:  __________________________________________________________________________________________________________________________________________________________________________________________  ?WHEN TO CALL YOUR DOCTOR: ?Fever over 101.0 ?Inability to urinate ?Nausea and/or vomiting ?Extreme swelling or bruising ?Continued bleeding from incision. ?Increased pain, redness, or drainage from the incision ? ?The clinic staff is available to answer your questions during regular business hours.  Please don?t hesitate to call and ask to speak to one of the nurses for clinical concerns.  If you have a medical emergency, go to the nearest emergency room or call 911.  A surgeon from [facility] is always on call at the hospital ? ? ?[address] [place], [place], [place]  [address] ? ? [address], [place], [place]   [address] ?[phone]    FAX [phone] ?Web site: [email or web address] ? ?

## 2021-06-24 NOTE — Transfer of Care (Signed)
Immediate Anesthesia Transfer of Care Note ? ?Patient: Dale [name] ? ?Procedure(s) Performed: RIGHT INGUINAL HERNIA REPAIR (Right: Groin) ?INSERTION OF MESH (Right: Groin) ? ?Patient Location: PACU ? ?Anesthesia Type:General ? ?Level of Consciousness: awake, oriented and patient cooperative ? ?Airway & Oxygen Therapy: Patient Spontanous Breathing and Patient connected to face mask oxygen ? ?Post-op Assessment: Report given to RN, Post -op Vital signs reviewed and stable and Patient moving all extremities X 4 ? ?Post vital signs: Reviewed and stable ? ?Last Vitals:  ?Vitals Value Taken Time  ?BP 127/69 06/24/21 1000  ?Temp    ?Pulse 78 06/24/21 1003  ?Resp 18 06/24/21 1003  ?SpO2 98 % 06/24/21 1003  ?Vitals shown include unvalidated device data. ? ?Last Pain:  ?Vitals:  ? 06/24/21 0700  ?TempSrc:   ?PainSc: 0-No pain  ?   ? ?Patients Stated Pain Goal: 1 (06/24/21 0700) ? ?Complications: No notable events documented. ?

## 2021-06-24 NOTE — Anesthesia Procedure Notes (Signed)
Anesthesia Regional Block: TAP block  ? ?Pre-Anesthetic Checklist: , timeout performed,  Correct Patient, Correct Site, Correct Laterality,  Correct Procedure, Correct Position, site marked,  Risks and benefits discussed,  Surgical consent,  Pre-op evaluation,  At surgeon's request and post-op pain management ? ?Laterality: Right ? ?Prep: chloraprep     ?  ?Needles:  ?Injection technique: Single-shot ? ?Needle Type: Echogenic Stimulator Needle   ? ? ?Needle Length: 10cm  ?Needle Gauge: 20  ? ? ? ?Additional Needles: ? ? ?Procedures:,,,, ultrasound used (permanent image in chart),,    ?Narrative:  ?Start time: 06/24/2021 7:00 AM ?End time: 06/24/2021 7:10 AM ?Injection made incrementally with aspirations every 5 mL. ? ?Performed by: Personally  ?Anesthesiologist: Murvin Natal, MD ? ?Additional Notes: ?Functioning IV was confirmed and monitors were applied.  A timeout was performed. Sterile prep, hand hygiene and sterile gloves were used. A 160m 20ga Bbraun echogenic stimulator needle was used. Negative aspiration and negative test dose prior to incremental administration of local anesthetic. The patient tolerated the procedure well. ? ?Ultrasound guidance: relevent anatomy identified, needle position confirmed, local anesthetic spread visualized around nerve(s), vascular puncture avoided.  Image printed for medical record.  ? ? ? ? ? ?

## 2021-06-24 NOTE — H&P (Signed)
?Subjective  ?  ?Chief Complaint: Hernia ?  ?  ?  ?History of Present Illness: ?Dale [name] is a 71 y.o. male who is seen today as an office consultation at the request of Dr. Einar Gip for evaluation of Hernia ?Marland Kitchen   ?This is a 71 year old male with multiple medical issues including DM2, stage III chronic kidney disease, HTN, hyperlipidemia, OSA on CPAP, atrial fibrillation who presents with several months of a tender right inguinal hernia.  He was recently examined by his cardiologist, who referred him to [place] for hernia repair.  I repaired his left inguinal hernia about 12 years ago.  He has no issues in this area.  However, about a month ago, he began noticing tenderness and swelling in his right groin.  The swelling runs down to his scrotum.  He denies any obstructive symptoms.  His symptoms improve when he is supine or relaxed.  No urinary symptoms.  No recent imaging. ?  ?  ?Review of Systems: ?A complete review of systems was obtained from the patient.  I have reviewed this information and discussed as appropriate with the patient.  See HPI as well for other ROS. ?  ?Review of Systems  ?Constitutional: Negative.   ?HENT: Positive for hearing loss.   ?Eyes: Negative.   ?Respiratory: Negative.   ?Cardiovascular: Positive for leg swelling.  ?Gastrointestinal: Negative.   ?Genitourinary: Negative.   ?Musculoskeletal: Negative.   ?Skin: Negative.   ?Neurological: Negative.   ?Endo/Heme/Allergies: Bruises/bleeds easily.  ?Psychiatric/Behavioral: Negative.   ?  ?  ?  ?Medical History: ?Past Medical History  ?       ?Past Medical History:  ?Diagnosis Date  ? Arthritis    ? CHF (congestive heart failure) (CMS-HCC)    ? Chronic kidney disease    ? Diabetes mellitus without complication (CMS-HCC)    ? Hyperlipidemia    ? Hypertension    ? Sleep apnea    ?  ?  ?  ?     ?Patient Active Problem List  ?Diagnosis  ? CKD (chronic kidney disease) stage 3, GFR 30-59 ml/min (CMS-HCC)  ? Diabetes mellitus type 2, controlled (CMS-HCC)   ? Essential hypertension  ? History of atrial fibrillation  ? Mixed hyperlipidemia  ? Obesity  ? OSA on CPAP  ? Right inguinal hernia  ?  ?  ?Left inguinal hernia repair with mesh ?  ?Allergies  ?         ?Allergies  ?Allergen Reactions  ? Allopurinol Other (See Comments)  ?    Foot pain, not a rash., 19 Mar 2010 ?Foot pain, not a rash., 19 Mar 2010 ?   ?  ?  ?  ?           ?Current Outpatient Medications on File Prior to Visit  ?Medication Sig Dispense Refill  ? atenoloL (TENORMIN) 25 MG tablet Take 1 tablet by mouth once daily      ? atorvastatin (LIPITOR) 80 MG tablet TAKE 1 TABLET BY MOUTH DAILY TO CONTROL CHOLESTEROL      ? fenofibrate nanocrystallized (TRICOR) 145 MG tablet Take by mouth      ? FUROsemide (LASIX) 40 MG tablet Take 1 tablet by mouth every morning      ? quinapriL (ACCUPRIL) 20 MG tablet Take 10 mg by mouth once daily      ? acetaminophen (TYLENOL) 500 MG tablet Take by mouth      ?  ?No current facility-administered medications on file prior to  visit.  ?  ?  ?Family History  ?         ?Family History  ?Problem Relation Age of Onset  ? High blood pressure (Hypertension) Mother    ? Hyperlipidemia (Elevated cholesterol) Mother    ? Diabetes Mother    ? Breast cancer Mother    ?  ?  ?  ?Social History  ?  ?     ?Tobacco Use  ?Smoking Status Unknown  ?Smokeless Tobacco Not on file  ?  ?  ?Social History  ?Social History  ?  ?       ?Socioeconomic History  ? Marital status: Married  ?Tobacco Use  ? Smoking status: Unknown  ?Vaping Use  ? Vaping Use: Never used  ?Substance and Sexual Activity  ? Alcohol use: Never  ? Drug use: Never  ? Sexual activity: Never  ?  ?  ?  ?Objective:  ?  ?  ?     ?Vitals:  ?   ?BP: 136/88  ?Pulse: 81  ?Temp: 36.9 ?C (98.4 ?F)  ?TempSrc: Temporal  ?SpO2: 98%  ?Weight: (!) 104.4 kg (230 lb 2 oz)  ?Height: 175.3 cm (5' 9" )  ?  ?Body mass index is 33.98 kg/m?. ?  ?Physical Exam  ?  ?Constitutional:  WDWN in NAD, conversant, no obvious deformities; lying in bed  comfortably ?Eyes:  Pupils equal, round; sclera anicteric; moist conjunctiva; no lid lag ?HENT:  Oral mucosa moist; good dentition  ?Neck:  No masses palpated, trachea midline; no thyromegaly ?Lungs:  CTA bilaterally; normal respiratory effort ?CV:  Regular rate and rhythm; no murmurs; extremities well-perfused with no edema ?Abd:  +bowel sounds, soft, non-tender, no palpable organomegaly; no palpable umbilical hernia ?GU: No sign of left inguinal hernia, large right inguinal hernia that extends down to the scrotum.  Reducible when he is supine.  No testicular masses ?Musc:  Unable to assess gait; no apparent clubbing or cyanosis in extremities ?Lymphatic:  No palpable cervical or axillary lymphadenopathy ?Skin:  Warm, dry; no sign of jaundice ?Psychiatric - alert and oriented x 4; calm mood and affect ?  ?  ?  ?Assessment and Plan:  ?Diagnoses and all orders for this visit: ?  ?Right inguinal hernia ?  ?  ?  ?Right inguinal hernia repair with mesh.  The surgical procedure has been discussed with the patient.  Potential risks, benefits, alternative treatments, and expected outcomes have been explained.  All of the patient's questions at this time have been answered.  The likelihood of reaching the patient's treatment goal is good.  The patient understand the proposed surgical procedure and wishes to proceed. ? ?Imogene Burn. Sabel Hornbeck, MD, FACS ?[facility]  ?General Surgery ? ? ?06/24/2021 ?8:05 AM ? ?

## 2021-06-24 NOTE — Op Note (Signed)
Right inguinal hernia repair with mesh Procedure Note ? ?Indications: This is a 71 year old male with multiple medical issues including DM2, stage III chronic kidney disease, HTN, hyperlipidemia, OSA on CPAP, atrial fibrillation who presents with several months of a tender right inguinal hernia.  He was recently examined by his cardiologist, who referred him to [place] for hernia repair.  I repaired his left inguinal hernia about 12 years ago.  He has no issues in this area.  However, about a month ago, he began noticing tenderness and swelling in his right groin.  The swelling runs down to his scrotum.  He denies any obstructive symptoms.  His symptoms improve when he is supine or relaxed. ? ?Pre-operative Diagnosis: right reducible inguinal hernia ?Post-operative Diagnosis: same ? ?Surgeon: Maia Petties  ? ?Assistants: none ? ?Anesthesia: General endotracheal anesthesia ? ?ASA Class: 2 ? ?Procedure Details  ?The patient was seen again in the Holding Room. The risks, benefits, complications, treatment options, and expected outcomes were discussed with the patient. The possibilities of reaction to medication, pulmonary aspiration, perforation of viscus, bleeding, recurrent infection, the need for additional procedures, and development of a complication requiring transfusion or further operation were discussed with the patient and/or family. The likelihood of success in repairing the hernia and returning the patient to their previous functional status is good.  There was concurrence with the proposed plan, and informed consent was obtained. The site of surgery was properly noted/marked. The patient was taken to the Operating Room, identified as Dale [name], and the procedure verified as right inguinal hernia repair. A Time Out was held and the above information confirmed. ? ?The patient was placed in the supine position and underwent induction of anesthesia. The lower abdomen and groin was prepped with Chloraprep  and draped in the standard fashion, and 0.25% Marcaine with epinephrine was used to anesthetize the skin over the mid-portion of the inguinal canal. He has a visible bulge in the groin.  An oblique incision was made. Dissection was carried down through the subcutaneous tissue with cautery to the external oblique fascia.  We opened the external oblique fascia along the direction of its fibers to the external ring.  He has a very large hernia sac protruding through the external ring.  The spermatic cord was circumferentially dissected bluntly and retracted with a Penrose drain.   The floor of the inguinal canal was inspected and was intact.  We skeletonized the spermatic cord and dissected a very large indirect hernia sac. The hernia sac was ligated at its neck with 2-0 silk.  The hernia sac was amputated.   The internal ring was tightened with 0 Vicryl.  We used a right Progrip mesh which was inserted and deployed across the floor of the inguinal canal. The mesh was tucked underneath the external oblique fascia laterally.  The flap of the mesh was closed around the spermatic cord to recreate the internal inguinal ring.  The mesh was secured to the pubic tubercle with 0 Vicryl.  Additional stay sutures of 0 vicryl were used to attach the lower edge of the mesh to the shelving edge.  The external oblique fascia was reapproximated with 2-0 Vicryl.  3-0 Vicryl was used to close the subcutaneous tissues and 4-0 Monocryl was used to close the skin in subcuticular fashion.  Benzoin and steri-strips were used to seal the incision.  A clean dressing was applied.  The patient was then extubated and brought to the recovery room in stable  condition.  All sponge, instrument, and needle counts were correct prior to closure and at the conclusion of the case. ? ? ?Estimated Blood Loss: Minimal ?          ?      ?Complications: None; patient tolerated the procedure well. ?        ?Disposition: PACU - hemodynamically stable. ?         ?Condition: stable ? ?Imogene Burn. Zakari Couchman, MD, FACS ?[facility]  ?General Surgery ? ? ?06/24/2021 ?9:51 AM ? ?  ?

## 2021-06-25 ENCOUNTER — Encounter (HOSPITAL_COMMUNITY): Payer: Self-pay | Admitting: Surgery

## 2021-06-25 LAB — SURGICAL PATHOLOGY

## 2021-08-02 ENCOUNTER — Other Ambulatory Visit: Payer: Self-pay | Admitting: Cardiology

## 2021-08-02 DIAGNOSIS — I5032 Chronic diastolic (congestive) heart failure: Secondary | ICD-10-CM

## 2021-11-05 ENCOUNTER — Other Ambulatory Visit: Payer: Self-pay | Admitting: Cardiology

## 2021-11-05 DIAGNOSIS — I5032 Chronic diastolic (congestive) heart failure: Secondary | ICD-10-CM

## 2021-11-19 ENCOUNTER — Ambulatory Visit: Payer: No Typology Code available for payment source | Admitting: Cardiology

## 2021-11-19 ENCOUNTER — Encounter: Payer: Self-pay | Admitting: Cardiology

## 2021-11-19 VITALS — BP 123/69 | HR 72 | Temp 97.8°F | Resp 16 | Ht 69.0 in | Wt 223.2 lb

## 2021-11-19 DIAGNOSIS — I5032 Chronic diastolic (congestive) heart failure: Secondary | ICD-10-CM

## 2021-11-19 DIAGNOSIS — I10 Essential (primary) hypertension: Secondary | ICD-10-CM

## 2021-11-19 NOTE — Progress Notes (Signed)
Primary Physician/Referring:  Christain Sacramento, MD  Patient ID: Dale [name], male    DOB: 11-12-1950, 71 y.o.   MRN: [id number]  Chief Complaint  Patient presents with   Chronic diastolic (congestive) heart failure    DOE   Hypertension   Follow-up    6 months    HPI:    Dale [name]  is a 71 y.o. Caucasian male with moderate obesity, prior tobacco use quit smoking in 2000, diabetes mellitus with  stage III chronic kidney disease, hypertension, hyperlipidemia, OSA on CPAP, chronic thrombocytopenia,  diagnosed with new onset atrial fibrillation in 2014, during routine physical and previously was following Dr. Donnetta Hutching in [facility].  He has been on flecainide 50 mg twice daily along with atenolol 50 mg daily and diagnosed with severe sleep apnea and has been compliant with CPAP and since then has not had any recurrence.  Flecainide was discontinued by me when he established with me in September 2020.  Patient presents for follow-up of acute on chronic diastolic heart failure.  Last hospitalization for heart failure was in [facility] on 10/28/2020 with pulmonary edema. This is a 16-monthoffice visit. Dyspnea has remained stable, no PND or orthopnea.   He is accompanied by his wife at the bedside.  No chest pain.  Chronic dyspnea is at baseline.    Past Medical History:  Diagnosis Date   Arthritis    CHF (congestive heart failure) (HCC)    Chronic kidney disease    Diabetes mellitus without complication ([place]    Dysrhythmia    A. Fib   Epigastric hernia    Gout    Hyperlipidemia    Hypertension    Sleep apnea    Tubular adenoma of colon 12/2010   Social History   Tobacco Use   Smoking status: Former    Packs/day: 1.50    Years: 10.00    Total pack years: 15.00    Types: Cigars, Cigarettes    Quit date: 2000    Years since quitting: 23.7   Smokeless tobacco: Former    Types: Chew    Quit date: 2000   Tobacco comments:    quite 1979 cigarettes  Substance Use Topics    Alcohol use: No   Marital Status: Married  ROS  Review of Systems  Cardiovascular:  Positive for dyspnea on exertion (stable) and leg swelling. Negative for chest pain and syncope.  Respiratory:  Positive for snoring (on CPAP and compliant).   Gastrointestinal:  Negative for melena.  All other systems reviewed and are negative.  Objective  Blood pressure 123/69, pulse 72, temperature 97.8 F (36.6 C), temperature source Temporal, resp. rate 16, height _0  (1.753 m), weight 223 lb 3.2 oz (101.2 kg), SpO2 97 %.     11/19/2021    1:10 PM 06/24/2021   10:40 AM 06/24/2021   10:37 AM  Vitals with BMI  Height _1     Weight 223 lbs 3 oz    BMI 349.20   Systolic 11001712  Diastolic 69 63   Pulse 72 77 78     Physical Exam Constitutional:      Appearance: He is well-developed.     Comments: Well built and moderately obese in no acute distress  Neck:     Vascular: JVD present. No carotid bruit.  Cardiovascular:     Rate and Rhythm: Normal rate and regular rhythm.     Pulses: Normal pulses and intact distal  pulses.     Heart sounds: Normal heart sounds. No murmur heard.    No gallop.  Pulmonary:     Effort: Pulmonary effort is normal. No accessory muscle usage.     Breath sounds: Normal breath sounds.  Abdominal:     General: Bowel sounds are normal.     Palpations: Abdomen is soft.     Comments: Obese abdomen  Musculoskeletal:     Right lower leg: No edema.     Left lower leg: No edema.    Laboratory examination:   Lab Results  Component Value Date   NA 138 06/15/2021   K 4.0 06/15/2021   CO2 21 (L) 06/15/2021   GLUCOSE 171 (H) 06/15/2021   BUN 24 (H) 06/15/2021   CREATININE 1.61 (H) 06/15/2021   CALCIUM 8.8 (L) 06/15/2021   EGFR 29 (L) 10/31/2020   GFRNONAA 46 (L) 06/15/2021       Latest Ref Rng & Units 06/15/2021    8:23 AM 10/31/2020   12:28 PM 08/20/2020   11:11 AM  CMP  Glucose 70 - 99 mg/dL 171  95  84   BUN 8 - 23 mg/dL 24  44  39   Creatinine 0.61 -  1.24 mg/dL 1.61  2.37  1.98   Sodium 135 - 145 mmol/L 138  142  138   Potassium 3.5 - 5.1 mmol/L 4.0  4.5  4.7   Chloride 98 - 111 mmol/L 111  108  105   CO2 22 - 32 mmol/L _0 Calcium 8.9 - 10.3 mg/dL 8.8  8.7  9.2       Latest Ref Rng & Units 06/15/2021    8:23 AM 10/31/2020   12:28 PM 07/13/2010   10:36 PM  CBC  WBC 4.0 - 10.5 K/uL 3.4  5.1    Hemoglobin 13.0 - 17.0 g/dL 10.3  9.6  15.6   Hematocrit 39.0 - 52.0 % 31.7  29.1  46.0   Platelets 150 - 400 K/uL 70  92      HEMOGLOBIN A1C Lab Results  Component Value Date   HGBA1C 6.4 (H) 06/15/2021   MPG 136.98 06/15/2021    External labs:  Labs 08/04/2021:  Total cholesterol 74, triglycerides 119, HDL 22, LDL 26.  Non-HDL cholesterol 52.  BUN 26, creatinine 1.54, EGFR 48,, potassium 3.9.  Hb 11.1/HCT 33.3, platelets 76, microcytic indicis.  Medications and allergies   Allergies  Allergen Reactions   Allopurinol Other (See Comments)    Foot pain, not a rash., 19 Mar 2010    Medications after today's encounter   Current Outpatient Medications:    ACCU-CHEK GUIDE test strip, , Disp: , Rfl:    acetaminophen (TYLENOL) 500 MG tablet, Take 1,000 mg by mouth in the morning and at bedtime., Disp: , Rfl:    atorvastatin (LIPITOR) 80 MG tablet, Take 80 mg by mouth daily., Disp: , Rfl:    cholecalciferol (VITAMIN D3) 25 MCG (1000 UNIT) tablet, Take 1,000 Units by mouth daily., Disp: , Rfl:    fenofibrate (TRICOR) 145 MG tablet, Take 145 mg by mouth daily., Disp: , Rfl:    Lancets (ONETOUCH ULTRASOFT) lancets, Test blood sugar twice a day and as needed, Disp: , Rfl:    lisinopril (ZESTRIL) 10 MG tablet, Take 1 tablet by mouth daily., Disp: , Rfl:    furosemide (LASIX) 40 MG tablet, Take 0.5 tablets (20 mg total) by mouth daily as needed for fluid., Disp: 90 tablet,  Rfl: 0  Radiology:   No results found.  Cardiac Studies:   Carotid artery duplex  11/21/2018: Minimal stenosis in the right internal carotid artery  (minimal).  Stenosis in the right external carotid artery (<50%). Minimal stenosis in the left internal carotid artery (minimal).  Stenosis in the left external carotid artery (<50%). Antegrade right vertebral artery flow. Antegrade left vertebral artery flow. Bilateral external carotid stenosis is the source of bruit.  Follow up studies when clinically indicated.  PCV ECHOCARDIOGRAM COMPLETE 11/04/2020  Narrative Echocardiogram 11/04/2020: Left ventricle cavity is normal in size and wall thickness. Normal global wall motion. Normal LV systolic function with EF 59%. Normal diastolic filling pattern. Mild (Grade I) aortic regurgitation. Mild (Grade I) mitral regurgitation. Previous study on 08/15/2020 reported EF 45-50%, mod MR, mild TR.   PCV MYOCARDIAL PERFUSION WITH LEXISCAN 11/03/2020 Lexiscan nuclear stress test performed using 1-day protocol. Small area of mildly decreased tracer uptake in apical inferior myocardium, likely due to gut attenuation. All segments of left ventricle demonstrated normal wall motion and thickening. Stress LVEF 81%. Low risk study.   EKG  EKG 10/19/2021: Normal sinus rhythm at the rate of 70 bpm, possible left atrial abnormality, left axis deviation, left anterior fascicular block.  IVCD, incomplete LBBB, moderate criteria for LVH.  Single PAC.  No change from 06/18/2020.   Assessment     ICD-10-CM   1. Chronic diastolic (congestive) heart failure (HCC)  I50.32 furosemide (LASIX) 40 MG tablet    2. Essential hypertension  I10 EKG 12-Lead       Medications Discontinued During This Encounter  Medication Reason   oxyCODONE (OXY IR/ROXICODONE) 5 MG immediate release tablet    quinapril (ACCUPRIL) 20 MG tablet    furosemide (LASIX) 40 MG tablet     No orders of the defined types were placed in this encounter.  Recommendations:   Dale [name]  is a 72 y.o. Caucasian male with moderate obesity, prior tobacco use quit smoking in 2000, diabetes  mellitus with  stage III chronic kidney disease, hypertension, hyperlipidemia, OSA on CPAP, chronic thrombocytopenia,  diagnosed with new onset atrial fibrillation in 2014, during routine physical and previously was following Dr. Donnetta Hutching in [facility].  He has been on flecainide 50 mg twice daily along with atenolol 50 mg daily and diagnosed with severe sleep apnea and has been compliant with CPAP and since then has not had any recurrence.  Flecainide was discontinued by me when he established with me in September 2020.  Patient presents for follow-up of acute on chronic diastolic heart failure.  Last hospitalization for heart failure was in [facility] on 10/28/2020 with pulmonary edema. This is a 69-monthoffice visit.  He has been extremely careful with his diet, he has lost about 7-8 pounds in weight since last time I saw him, has not had any further episodes of PND or orthopnea or worsening dyspnea.  Blood pressure is now well controlled, I reviewed his external labs, hemoglobin is also improved and his renal function is also improved.  I have discussed with him regarding at least 20 minutes to 30 minutes of walking on a daily basis as well and continued weight loss which will certainly help with diastolic heart failure.  His lipids are well controlled.  No clinical evidence of heart failure.  I will change his furosemide that he is taking 20 mg on a daily basis to taking it on a as needed basis for excessive fluid or increased weight gain or  dyspnea.  I will see him back in 6 months, if he remains stable I will see him back on a as needed basis.  Presently on appropriate medical therapy.       Adrian Prows, MD, [facility] 11/19/2021, 1:57 PM Office: [phone] Fax: [phone] Pager: [phone]

## 2021-12-30 DIAGNOSIS — N1831 Chronic kidney disease, stage 3a: Secondary | ICD-10-CM | POA: Diagnosis present

## 2021-12-30 DIAGNOSIS — D539 Nutritional anemia, unspecified: Secondary | ICD-10-CM | POA: Diagnosis present

## 2022-01-15 ENCOUNTER — Telehealth: Payer: Self-pay

## 2022-01-15 DIAGNOSIS — R600 Localized edema: Secondary | ICD-10-CM

## 2022-01-15 NOTE — Telephone Encounter (Signed)
ON-CALL CARDIOLOGY 01/15/22  Patient's name: Dale [name].   MRN: [id number].    DOB: 1950-05-21 Primary care provider: Christain Sacramento, MD. Primary cardiologist: Adrian Prows, MD, [facility]  Chief Complaint  Patient presents with   Leg Swelling    Interaction regarding this patient's care today: Received page from patients wife stating that since being discharged from hospital he has been taking Lasix 25m daily and is still having lower extremity swelling that has worsened over the past 3 days. She denies that he has shortness breath or chest pain.  Impression:   ICD-10-CM   1. Bilateral lower extremity edema  RM42.6Basic Metabolic Panel (BMET)      Orders Placed This Encounter  Procedures   Basic Metabolic Panel (BMET)    Recommendations: Take Lasix 482mtwice daily for the next 4 days and then have labs checked in 1 week. Advised compression stockings while awake and discussed importance of low sodium intake. If symptoms worsen or do not improve I have advised him to schedule office visit.  Telephone encounter total time: 10 minutes   BrErnst SpellAGVirginiaager: [phone]: [phone]

## 2022-01-19 ENCOUNTER — Encounter: Payer: Self-pay | Admitting: Cardiology

## 2022-01-19 ENCOUNTER — Ambulatory Visit: Payer: No Typology Code available for payment source | Admitting: Cardiology

## 2022-01-19 ENCOUNTER — Encounter (HOSPITAL_COMMUNITY): Payer: Self-pay

## 2022-01-19 VITALS — BP 124/73 | HR 89 | Resp 16 | Ht 69.0 in | Wt 226.8 lb

## 2022-01-19 DIAGNOSIS — R600 Localized edema: Secondary | ICD-10-CM

## 2022-01-19 DIAGNOSIS — K7151 Toxic liver disease with chronic active hepatitis with ascites: Secondary | ICD-10-CM

## 2022-01-19 DIAGNOSIS — K7469 Other cirrhosis of liver: Secondary | ICD-10-CM

## 2022-01-19 DIAGNOSIS — I5033 Acute on chronic diastolic (congestive) heart failure: Secondary | ICD-10-CM

## 2022-01-19 DIAGNOSIS — I10 Essential (primary) hypertension: Secondary | ICD-10-CM

## 2022-01-19 DIAGNOSIS — I4819 Other persistent atrial fibrillation: Secondary | ICD-10-CM

## 2022-01-19 DIAGNOSIS — N183 Chronic kidney disease, stage 3 unspecified: Secondary | ICD-10-CM

## 2022-01-19 DIAGNOSIS — D638 Anemia in other chronic diseases classified elsewhere: Secondary | ICD-10-CM

## 2022-01-19 DIAGNOSIS — J90 Pleural effusion, not elsewhere classified: Secondary | ICD-10-CM

## 2022-01-19 MED ORDER — TORSEMIDE 20 MG PO TABS: 20.0000 mg | ORAL_TABLET | Freq: Two times a day (BID) | ORAL | 0 refills | Status: DC

## 2022-01-19 NOTE — Progress Notes (Signed)
Primary Physician/Referring:  Christain Sacramento, MD  Patient ID: Dale [name], male    DOB: 07/15/1950, 71 y.o.   MRN: [id number]  Chief Complaint  Patient presents with   Leg Swelling   Congestive Heart Failure   Follow-up    HPI:    Dale [name]  is a 71 y.o.  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  Established with me in 2020, has now been on anticoagulation.     Last hospitalization for heart failure was in [facility] on 10/28/2020 with pulmonary edema.  Again admitted with community-acquired pneumonia on 12/29/2021 in [facility], now presents for follow-up as he has noticed worsening leg edema.  While in the hospital, CT of the chest on 12/30/2021 to very large right pleural effusion along with pneumonia and cirrhosis of the liver with portal hypertension, he had approximately about 10 L of fluid aspirated from his right pleural effusion.  He has been home since 01/09/2022.  Patient called [place] 3-4 days ago c/o worsening dyspnea and leg edema. Hence we brought him in to be evaluated.  Continues to have marked fatigue and dyspnea with minimal activity. Has orthopnea. No chest pain or palpitations. States he was also in atrial fibrillation with RVR while in the hospital.   Past Medical History:  Diagnosis Date   Arthritis    CHF (congestive heart failure) ([place])    Chronic kidney disease    Diabetes mellitus without complication ([place])    Dysrhythmia    A. Fib   Epigastric hernia    Gout    Hyperlipidemia    Hypertension    Sleep apnea    Tubular adenoma of colon 12/2010    Social History   Tobacco Use   Smoking status: Former    Packs/day: 1.50    Years: 10.00    Total pack years: 15.00    Types: Cigars, Cigarettes    Quit date: 2000    Years since quitting: 23.9   Smokeless tobacco: Former    Types: Chew    Quit date: 2000   Tobacco comments:    quite 1979 cigarettes  Substance Use Topics   Alcohol use: No   Marital Status: Married  ROS  Review of Systems  Cardiovascular:  Positive for dyspnea on exertion (stable) and leg swelling. Negative for chest pain and syncope.  Respiratory:  Positive for snoring (on CPAP and compliant).   Gastrointestinal:  Negative for melena.  All other systems reviewed and are negative.  Objective  Blood pressure 124/73, pulse 89, resp. rate 16, height _0  (1.753 m), weight 226 lb 12.8 oz (102.9 kg), SpO2 95 %.     01/19/2022    2:24 PM 11/19/2021    1:10 PM 06/24/2021   10:40 AM  Vitals with BMI  Height _1  _2    Weight 226 lbs 13 oz 223 lbs 3 oz   BMI 09.73 53.29   Systolic 924 268 341  Diastolic 73 69 63  Pulse 89 72 77     Physical Exam Constitutional:      Appearance: He is well-developed.     Comments: Well built and moderately  obese in no acute distress  Neck:     Vascular: JVD present. No carotid bruit.  Cardiovascular:     Rate and Rhythm: Normal rate. Rhythm irregularly irregular.     Pulses: Intact distal pulses.          Dorsalis pedis pulses are 1+ on the right side and 1+ on the left side.       Posterior tibial pulses are 1+ on the right side and 1+ on the left side.     Heart sounds: Heart sounds are distant. No murmur heard.    No gallop.  Pulmonary:     Effort: Pulmonary effort is normal. No accessory muscle usage.     Breath sounds: Normal breath sounds.  Abdominal:     General: Bowel sounds are normal. There is distension.     Palpations: Abdomen is soft. There is shifting dullness.     Comments: Obese abdomen  Musculoskeletal:     Right lower leg: Edema (3+ pitting)  present.     Left lower leg: Edema (3+ pitting) present.    Laboratory examination:   Lab Results  Component Value Date   NA 138 06/15/2021   K 4.0 06/15/2021   CO2 21 (L) 06/15/2021   GLUCOSE 171 (H) 06/15/2021   BUN 24 (H) 06/15/2021   CREATININE 1.61 (H) 06/15/2021   CALCIUM 8.8 (L) 06/15/2021   EGFR 29 (L) 10/31/2020   GFRNONAA 46 (L) 06/15/2021       Latest Ref Rng & Units 06/15/2021    8:23 AM 10/31/2020   12:28 PM 08/20/2020   11:11 AM  CMP  Glucose 70 - 99 mg/dL 171  95  84   BUN 8 - 23 mg/dL 24  44  39   Creatinine 0.61 - 1.24 mg/dL 1.61  2.37  1.98   Sodium 135 - 145 mmol/L 138  142  138   Potassium 3.5 - 5.1 mmol/L 4.0  4.5  4.7   Chloride 98 - 111 mmol/L 111  108  105   CO2 22 - 32 mmol/L _0 Calcium 8.9 - 10.3 mg/dL 8.8  8.7  9.2       Latest Ref Rng & Units 06/15/2021    8:23 AM 10/31/2020   12:28 PM 07/13/2010   10:36 PM  CBC  WBC 4.0 - 10.5 K/uL 3.4  5.1    Hemoglobin 13.0 - 17.0 g/dL 10.3  9.6  15.6   Hematocrit 39.0 - 52.0 % 31.7  29.1  46.0   Platelets 150 - 400 K/uL 70  92      HEMOGLOBIN A1C Lab Results  Component Value Date   HGBA1C 6.4 (H) 06/15/2021   MPG 136.98 06/15/2021    External labs:  Labs 01/08/2022:  Sodium 129, potassium 3.6, BUN 55, creatinine 1.86, EGFR 38 mL.  Hb 10.9/HCT 31.3, platelets 79.  Microcytic indicis.  TSH 0.5-0, A1c 5.7%, serum ferritin, iron levels with TIBC and saturation, transferrin levels were normal.  NT proBNP 549.  Pleural aspirate analysis: Glucose 123, lactate dehydrogenase 105, pH 7.5, protein <2 g/dL, 25% neutrophils, 68% lymphocytes, absolute WBC count 143.  Pathology reveals atypical cells, nonspecific.  Indeterminate for reactive versus neoplastic process.  Acute hepatitis panel including for hepatitis A and B-  -Ve  Iron 49 - 181 mcg/dL       127  TOTAL IRON BINDING CAPACITY 261 - 462 mcg/dL  330  Iron Saturation %  38.5   Labs 08/04/2021:  Total cholesterol 74,  triglycerides 119, HDL 22, LDL 26.  Non-HDL cholesterol 52.  Medications and allergies   Allergies  Allergen Reactions   Allopurinol Other (See Comments)    Foot pain, not a rash., 19 Mar 2010    Medications after today's encounter   Current Outpatient Medications:    ACCU-CHEK GUIDE test strip, , Disp: , Rfl:    atorvastatin (LIPITOR) 80 MG tablet, Take 80 mg by mouth daily., Disp: , Rfl:    cholecalciferol (VITAMIN D3) 25 MCG (1000 UNIT) tablet, Take 1,000 Units by mouth daily., Disp: , Rfl:    diltiazem (CARDIZEM CD) 120 MG 24 hr capsule, Take 120 mg by mouth daily., Disp: , Rfl:    fenofibrate (TRICOR) 145 MG tablet, Take 145 mg by mouth daily., Disp: , Rfl:    glipiZIDE (GLUCOTROL) 5 MG tablet, Take 5 mg by mouth daily before breakfast., Disp: , Rfl:    Lancets (ONETOUCH ULTRASOFT) lancets, Test blood sugar twice a day and as needed, Disp: , Rfl:    torsemide (DEMADEX) 20 MG tablet, Take 1 tablet (20 mg total) by mouth 2 (two) times daily., Disp: 60 tablet, Rfl: 0  Radiology:   Complete ultrasound of the abdomen 12/30/2021: Small volume ascites. The liver measures 15.3 cm. There is irregularity along the liver margins along with coarse heterogeneous echotexture all consistent with cirrhosis. There is no solid mass. Doppler evaluation shows normal directional flow in the portal vein, hepatic artery, hepatic veins. The inferior vena cava is not seen. There is no ductal dilatation with common bile duct measuring 4 mm.  The gallbladder contains multiple small stones. The wall measures 7.3 mm however this could be due to the adjacent ascites. There is no reported tenderness.  The right kidney measures 10.5 x 5.4 x 5.0 cm and the left 10.8 x 6.8 x 6.3 cm. No stones, obstruction or masses. Doppler evaluation shows flow within the kidneys.  The spleen is grossly enlarged measuring 17.0 x 7.5 x 17.1 cm. No mass.   CT chest without contrast 12/30/2021: Large right pleural effusion with  adjacent opacities that may all  reflect pneumonia and/or atelectasis. Central obstructing mass not excluded, follow-up to radiographic resolution recommended following treatment. Contrast enhanced CT, bronchoscopy, or pleural fluid sampling could also be considered.   Cardiac Studies:   Carotid artery duplex  11/21/2018: Minimal stenosis in the right internal carotid artery (minimal).  Stenosis in the right external carotid artery (<50%). Minimal stenosis in the left internal carotid artery (minimal).  Stenosis in the left external carotid artery (<50%). Antegrade right vertebral artery flow. Antegrade left vertebral artery flow. Bilateral external carotid stenosis is the source of bruit.  Follow up studies when clinically indicated.  PCV ECHOCARDIOGRAM COMPLETE 11/04/2020  Narrative Echocardiogram 11/04/2020: Left ventricle cavity is normal in size and wall thickness. Normal global wall motion. Normal LV systolic function with EF 59%. Normal diastolic filling pattern. Mild (Grade I) aortic regurgitation. Mild (Grade I) mitral regurgitation. Previous study on 08/15/2020 reported EF 45-50%, mod MR, mild TR.   PCV MYOCARDIAL PERFUSION WITH LEXISCAN 11/03/2020 Lexiscan nuclear stress test performed using 1-day protocol. Small area of mildly decreased tracer uptake in apical inferior myocardium, likely due to gut attenuation. All segments of left ventricle demonstrated normal wall motion and thickening. Stress LVEF 81%. Low risk study.  Echocardiogram 01/03/2022: Normal LV size, mild LVH, normal wall motion.  EF 60 to 65%. Normal RV size and function. Mild mitral and  calcification.  Tricuspid valve is normal.  No pulm hypertension.   EKG  EKG 01/19/2022: Atrial fibrillation with controlled ventricular response at a rate of 93 bpm, left axis deviation, left anterior fascicular block.  Poor R wave progression, cannot exclude anteroseptal infarct old.  IVCD, incomplete left bundle branch  block.  Low voltage complexes, consider pulmonary disease pattern.  Compared to 11/19/2021, atrial fibrillation has replaced sinus rhythm.  Assessment     ICD-10-CM   1. Acute on chronic diastolic heart failure (HCC)  I50.33 EKG 12-Lead    2. Persistent atrial fibrillation (HCC)  I48.19     3. Bilateral lower extremity edema  R60.0     4. Recurrent right pleural effusion  J90     5. 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 Other cirrhosis of liver ([place])  K74.69     7. Essential hypertension  I10     8. Controlled type 2 diabetes mellitus with stage 3 chronic kidney disease, without long-term current use of insulin (HCC)  E11.22    N18.30     9. Anemia of chronic disease  D63.8     CHA2DS2-VASc Score is 4.  Yearly risk of stroke: 5% (A, HTN, DM, CHF).  Score of 1=0.6; 2=2.2; 3=3.2; 4=4.8; 5=7.2; 6=9.8; 7=>9.8) -(CHF; HTN; vasc disease DM,  Male = 1; Age <65 =0; 65-74 = 1,  >75 =2; stroke/embolism= 2).    Medications Discontinued During This Encounter  Medication Reason   acetaminophen (TYLENOL) 500 MG tablet    lisinopril (ZESTRIL) 10 MG tablet    furosemide (LASIX) 40 MG tablet Ineffective    Meds ordered this encounter  Medications   torsemide (DEMADEX) 20 MG tablet    Sig: Take 1 tablet (20 mg total) by mouth 2 (two) times daily.    Dispense:  60 tablet    Refill:  0   Recommendations:   Dale [name]  is a 71 y.o.  Caucasian male with moderate obesity, prior tobacco use quit smoking in 2000, diabetes mellitus with  stage IIIb to IV chronic kidney disease, hypertension, hyperlipidemia, chronic diastolic heart failure, chronic dyspnea on exertion, OSA on CPAP, chronic thrombocytopenia, history of atrial fibrillation in 2014, also diagnosed with severe sleep apnea and has been compliant with CPAP with no recurrence of atrial fibrillation hence flecainide was discontinued.  Established with me in 2020, has now been on anticoagulation.     Last hospitalization for heart failure was in [facility] on 10/28/2020 with pulmonary edema.  Again admitted with community-acquired pneumonia on 12/29/2021 in [facility], now presents for follow-up as he has noticed worsening leg edema.  While in the hospital, CT of the chest on 12/30/2021 to very large right pleural effusion along with pneumonia and cirrhosis of the liver with portal hypertension, he had approximately about 10 L of fluid aspirated from his right pleural effusion.  He has been home since 01/09/2022. He was also transiently in A. Fib while in the hospital.   1. Acute on chronic diastolic heart failure ([place]) Patient in acute decompensated congestive heart failure, clearly related to his poor eating habits and drinking habits and recent travel.  Also admitted with possibly pneumonia and massive pleural effusion, I suspect more likely that right pleural effusion, ascites, we are all part of heart failure along with cirrhosis of the liver with pulmonary and portal hypertension related to acute on chronic diastolic heart failure. I will discontinue Lasix and  switch him to torsemide 20 mg twice daily, extensive discussion with the patient and his wife regarding fluid and salt restriction. Patient will be arranged for elective admission to the hospital to avoid ED visit.  He is relatively stable still and not hypoxemic and not in pulm edema but has a very large right pleural effusion by exam and also evidence of acute decompensated heart failure. He also has severe hyponatremia related to volume excess.  2. Persistent atrial fibrillation (HCC) CHA2DS2-VASc Score is 4.  Yearly risk of stroke: 5% (A, HTN, DM, CHF).  Score of 1=0.6; 2=2.2; 3=3.2; 4=4.8; 5=7.2; 6=9.8; 7=>9.8) -(CHF; HTN; vasc disease DM,  Male = 1; Age <65 =0; 65-74 = 1,  >75 =2; stroke/embolism= 2).   Patient will need to be in anticoagulation.;  Admitted for his hospitalization, I did not start him on anticoagulation today but will  start upon admission to the hospital with either Eliquis  3. Bilateral lower extremity edema 3+ bilateral leg edema, needs more diuresis.  Could be part of generalized anasarca and hypoproteinemia, while in the hospital, serum albumin was never checked.  4. Recurrent right pleural effusion He will need pleurocentesis and probably chest tube placement as patient had almost 10 L of fluid drained while in [place] just 2 weeks ago.  He probably will need pleural VAC placed for several weeks and will consider TCTS consultation  5. 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  6. Other cirrhosis of liver ([place]) Patient will need CMP to look for hypoalbuminemia and evaluate for anasarca and urine analysis.  7. Essential hypertension Blood pressure is well-controlled on present medical regimen.  8. 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  9. Anemia of chronic disease Extensive review of his external labs and radiology reviewed.  Iron levels were normal.  No history of melena.  I have discussed the findings with Dr. Vance Gather, MD (Hospitalist Service to admit) and happy to consult. When patient is stable, will consider at least right heart catheterization if not both left and right.  We have called bed control and requested bed for direct admission to avoid ED visit.  I will continue to follow the patient closely tomorrow as well.  It was a greater than 90 minutes of my time in evaluation of his records, coordination of care, complexity of medical issues.    Adrian Prows, MD, [facility] 01/19/2022, 9:42 PM Office: [phone] Fax: [phone] Pager: [phone]

## 2022-01-20 ENCOUNTER — Other Ambulatory Visit: Payer: Self-pay

## 2022-01-20 ENCOUNTER — Inpatient Hospital Stay (HOSPITAL_COMMUNITY): Payer: Medicare Other

## 2022-01-20 ENCOUNTER — Inpatient Hospital Stay (HOSPITAL_COMMUNITY)
Admission: AD | Admit: 2022-01-20 | Discharge: 2022-01-31 | DRG: 291 | Disposition: A | Discharge status: Home Health | Payer: Medicare Other | Source: Ambulatory Visit | Attending: Family Medicine | Admitting: Family Medicine

## 2022-01-20 ENCOUNTER — Encounter (HOSPITAL_COMMUNITY): Payer: Self-pay | Admitting: Family Medicine

## 2022-01-20 DIAGNOSIS — K7210 Chronic hepatic failure without coma: Secondary | ICD-10-CM | POA: Diagnosis present

## 2022-01-20 DIAGNOSIS — I4819 Other persistent atrial fibrillation: Secondary | ICD-10-CM | POA: Diagnosis present

## 2022-01-20 DIAGNOSIS — N179 Acute kidney failure, unspecified: Secondary | ICD-10-CM | POA: Diagnosis present

## 2022-01-20 DIAGNOSIS — E876 Hypokalemia: Secondary | ICD-10-CM | POA: Diagnosis not present

## 2022-01-20 DIAGNOSIS — I5033 Acute on chronic diastolic (congestive) heart failure: Secondary | ICD-10-CM | POA: Diagnosis present

## 2022-01-20 DIAGNOSIS — E1140 Type 2 diabetes mellitus with diabetic neuropathy, unspecified: Secondary | ICD-10-CM | POA: Diagnosis present

## 2022-01-20 DIAGNOSIS — D539 Nutritional anemia, unspecified: Secondary | ICD-10-CM | POA: Diagnosis present

## 2022-01-20 DIAGNOSIS — I2489 Other forms of acute ischemic heart disease: Secondary | ICD-10-CM | POA: Diagnosis present

## 2022-01-20 DIAGNOSIS — D631 Anemia in chronic kidney disease: Secondary | ICD-10-CM | POA: Diagnosis present

## 2022-01-20 DIAGNOSIS — I130 Hypertensive heart and chronic kidney disease with heart failure and stage 1 through stage 4 chronic kidney disease, or unspecified chronic kidney disease: Secondary | ICD-10-CM | POA: Diagnosis present

## 2022-01-20 DIAGNOSIS — E1122 Type 2 diabetes mellitus with diabetic chronic kidney disease: Secondary | ICD-10-CM | POA: Diagnosis present

## 2022-01-20 DIAGNOSIS — K222 Esophageal obstruction: Secondary | ICD-10-CM | POA: Diagnosis not present

## 2022-01-20 DIAGNOSIS — K3189 Other diseases of stomach and duodenum: Secondary | ICD-10-CM | POA: Diagnosis present

## 2022-01-20 DIAGNOSIS — R1310 Dysphagia, unspecified: Secondary | ICD-10-CM | POA: Diagnosis present

## 2022-01-20 DIAGNOSIS — M1990 Unspecified osteoarthritis, unspecified site: Secondary | ICD-10-CM | POA: Diagnosis not present

## 2022-01-20 DIAGNOSIS — I4891 Unspecified atrial fibrillation: Secondary | ICD-10-CM | POA: Diagnosis not present

## 2022-01-20 DIAGNOSIS — K2980 Duodenitis without bleeding: Secondary | ICD-10-CM | POA: Diagnosis present

## 2022-01-20 DIAGNOSIS — G4733 Obstructive sleep apnea (adult) (pediatric): Secondary | ICD-10-CM | POA: Diagnosis present

## 2022-01-20 DIAGNOSIS — D689 Coagulation defect, unspecified: Secondary | ICD-10-CM | POA: Diagnosis not present

## 2022-01-20 DIAGNOSIS — K766 Portal hypertension: Secondary | ICD-10-CM | POA: Diagnosis present

## 2022-01-20 DIAGNOSIS — J90 Pleural effusion, not elsewhere classified: Secondary | ICD-10-CM

## 2022-01-20 DIAGNOSIS — R601 Generalized edema: Secondary | ICD-10-CM

## 2022-01-20 DIAGNOSIS — Z7984 Long term (current) use of oral hypoglycemic drugs: Secondary | ICD-10-CM

## 2022-01-20 DIAGNOSIS — M109 Gout, unspecified: Secondary | ICD-10-CM | POA: Diagnosis present

## 2022-01-20 DIAGNOSIS — N1832 Chronic kidney disease, stage 3b: Secondary | ICD-10-CM

## 2022-01-20 DIAGNOSIS — E669 Obesity, unspecified: Secondary | ICD-10-CM | POA: Diagnosis present

## 2022-01-20 DIAGNOSIS — D6959 Other secondary thrombocytopenia: Secondary | ICD-10-CM | POA: Diagnosis present

## 2022-01-20 DIAGNOSIS — K7460 Unspecified cirrhosis of liver: Secondary | ICD-10-CM | POA: Diagnosis present

## 2022-01-20 DIAGNOSIS — I8510 Secondary esophageal varices without bleeding: Secondary | ICD-10-CM | POA: Diagnosis present

## 2022-01-20 DIAGNOSIS — N1831 Chronic kidney disease, stage 3a: Secondary | ICD-10-CM | POA: Diagnosis not present

## 2022-01-20 DIAGNOSIS — K7581 Nonalcoholic steatohepatitis (NASH): Secondary | ICD-10-CM | POA: Diagnosis present

## 2022-01-20 DIAGNOSIS — E119 Type 2 diabetes mellitus without complications: Secondary | ICD-10-CM

## 2022-01-20 DIAGNOSIS — R188 Other ascites: Secondary | ICD-10-CM | POA: Diagnosis present

## 2022-01-20 DIAGNOSIS — Z7189 Other specified counseling: Secondary | ICD-10-CM | POA: Diagnosis not present

## 2022-01-20 DIAGNOSIS — E871 Hypo-osmolality and hyponatremia: Secondary | ICD-10-CM | POA: Diagnosis present

## 2022-01-20 DIAGNOSIS — N184 Chronic kidney disease, stage 4 (severe): Secondary | ICD-10-CM | POA: Diagnosis not present

## 2022-01-20 DIAGNOSIS — J918 Pleural effusion in other conditions classified elsewhere: Secondary | ICD-10-CM | POA: Diagnosis not present

## 2022-01-20 DIAGNOSIS — Z6831 Body mass index (BMI) 31.0-31.9, adult: Secondary | ICD-10-CM

## 2022-01-20 DIAGNOSIS — Z801 Family history of malignant neoplasm of trachea, bronchus and lung: Secondary | ICD-10-CM

## 2022-01-20 DIAGNOSIS — Z789 Other specified health status: Secondary | ICD-10-CM | POA: Diagnosis not present

## 2022-01-20 DIAGNOSIS — Z515 Encounter for palliative care: Secondary | ICD-10-CM | POA: Diagnosis not present

## 2022-01-20 DIAGNOSIS — J189 Pneumonia, unspecified organism: Secondary | ICD-10-CM | POA: Diagnosis present

## 2022-01-20 DIAGNOSIS — E611 Iron deficiency: Secondary | ICD-10-CM | POA: Diagnosis present

## 2022-01-20 DIAGNOSIS — Z833 Family history of diabetes mellitus: Secondary | ICD-10-CM

## 2022-01-20 DIAGNOSIS — Z79899 Other long term (current) drug therapy: Secondary | ICD-10-CM

## 2022-01-20 DIAGNOSIS — L8990 Pressure ulcer of unspecified site, unspecified stage: Secondary | ICD-10-CM | POA: Insufficient documentation

## 2022-01-20 DIAGNOSIS — K769 Liver disease, unspecified: Secondary | ICD-10-CM | POA: Diagnosis not present

## 2022-01-20 DIAGNOSIS — R0602 Shortness of breath: Secondary | ICD-10-CM | POA: Diagnosis present

## 2022-01-20 DIAGNOSIS — L89322 Pressure ulcer of left buttock, stage 2: Secondary | ICD-10-CM | POA: Diagnosis present

## 2022-01-20 DIAGNOSIS — R161 Splenomegaly, not elsewhere classified: Secondary | ICD-10-CM | POA: Diagnosis present

## 2022-01-20 DIAGNOSIS — E785 Hyperlipidemia, unspecified: Secondary | ICD-10-CM | POA: Diagnosis present

## 2022-01-20 DIAGNOSIS — Z888 Allergy status to other drugs, medicaments and biological substances status: Secondary | ICD-10-CM

## 2022-01-20 DIAGNOSIS — E781 Pure hyperglyceridemia: Secondary | ICD-10-CM | POA: Diagnosis present

## 2022-01-20 DIAGNOSIS — E8809 Other disorders of plasma-protein metabolism, not elsewhere classified: Secondary | ICD-10-CM | POA: Diagnosis present

## 2022-01-20 DIAGNOSIS — I444 Left anterior fascicular block: Secondary | ICD-10-CM | POA: Diagnosis present

## 2022-01-20 DIAGNOSIS — J948 Other specified pleural conditions: Secondary | ICD-10-CM | POA: Insufficient documentation

## 2022-01-20 DIAGNOSIS — I10 Essential (primary) hypertension: Secondary | ICD-10-CM | POA: Diagnosis present

## 2022-01-20 LAB — TROPONIN I (HIGH SENSITIVITY)
Troponin I (High Sensitivity): 55 ng/L — ABNORMAL HIGH (ref <18)
Troponin I (High Sensitivity): 46 ng/L — ABNORMAL HIGH (ref <18)

## 2022-01-20 LAB — TSH: TSH: 0.79 u[IU]/mL (ref 0.350–4.500)

## 2022-01-20 LAB — COMPREHENSIVE METABOLIC PANEL
ALT: 79 U/L — ABNORMAL HIGH (ref 0–44)
AST: 110 U/L — ABNORMAL HIGH (ref 15–41)
Albumin: 2.7 g/dL — ABNORMAL LOW (ref 3.5–5.0)
Alkaline Phosphatase: 117 U/L (ref 38–126)
Anion gap: 20 — ABNORMAL HIGH (ref 5–15)
BUN: 69 mg/dL — ABNORMAL HIGH (ref 8–23)
CO2: 19 mmol/L — ABNORMAL LOW (ref 22–32)
Calcium: 9.4 mg/dL (ref 8.9–10.3)
Chloride: 94 mmol/L — ABNORMAL LOW (ref 98–111)
Creatinine, Ser: 2.5 mg/dL — ABNORMAL HIGH (ref 0.61–1.24)
GFR, Estimated: 27 mL/min — ABNORMAL LOW (ref >60)
Glucose, Bld: 198 mg/dL — ABNORMAL HIGH (ref 70–99)
Potassium: 3.7 mmol/L (ref 3.5–5.1)
Sodium: 133 mmol/L — ABNORMAL LOW (ref 135–145)
Total Bilirubin: 1.8 mg/dL — ABNORMAL HIGH (ref 0.3–1.2)
Total Protein: 7.3 g/dL (ref 6.5–8.1)

## 2022-01-20 LAB — CBC
HCT: 39.6 % (ref 39.0–52.0)
Hemoglobin: 13.8 g/dL (ref 13.0–17.0)
MCH: 33.3 pg (ref 26.0–34.0)
MCHC: 34.8 g/dL (ref 30.0–36.0)
MCV: 95.7 fL (ref 80.0–100.0)
Platelets: 89 10*3/uL — ABNORMAL LOW (ref 150–400)
RBC: 4.14 MIL/uL — ABNORMAL LOW (ref 4.22–5.81)
RDW: 14 % (ref 11.5–15.5)
WBC: 12.7 10*3/uL — ABNORMAL HIGH (ref 4.0–10.5)
nRBC: 0 % (ref 0.0–0.2)

## 2022-01-20 LAB — GLUCOSE, CAPILLARY
Glucose-Capillary: 190 mg/dL — ABNORMAL HIGH (ref 70–99)
Glucose-Capillary: 166 mg/dL — ABNORMAL HIGH (ref 70–99)

## 2022-01-20 LAB — MAGNESIUM: Magnesium: 2.1 mg/dL (ref 1.7–2.4)

## 2022-01-20 LAB — PROCALCITONIN: Procalcitonin: 0.54 ng/mL

## 2022-01-20 LAB — BRAIN NATRIURETIC PEPTIDE: B Natriuretic Peptide: 130.1 pg/mL — ABNORMAL HIGH (ref 0.0–100.0)

## 2022-01-20 MED ORDER — FUROSEMIDE 10 MG/ML IJ SOLN: 60.0000 mg | Freq: Two times a day (BID) | INTRAMUSCULAR | Status: DC

## 2022-01-20 MED ORDER — INSULIN ASPART 100 UNIT/ML IJ SOLN
0.0000 [IU] | Freq: Three times a day (TID) | INTRAMUSCULAR | Status: DC
  Administered 2022-01-20 – 2022-01-22 (×5): 2 [IU] via SUBCUTANEOUS
  Administered 2022-01-23: 3 [IU] via SUBCUTANEOUS
  Administered 2022-01-23: 1 [IU] via SUBCUTANEOUS
  Administered 2022-01-24: 2 [IU] via SUBCUTANEOUS
  Administered 2022-01-24 (×2): 1 [IU] via SUBCUTANEOUS
  Administered 2022-01-25: 3 [IU] via SUBCUTANEOUS
  Administered 2022-01-25: 2 [IU] via SUBCUTANEOUS
  Administered 2022-01-25 – 2022-01-26 (×2): 1 [IU] via SUBCUTANEOUS
  Administered 2022-01-26 (×2): 3 [IU] via SUBCUTANEOUS
  Administered 2022-01-27: 5 [IU] via SUBCUTANEOUS
  Administered 2022-01-27 (×2): 2 [IU] via SUBCUTANEOUS
  Administered 2022-01-28 (×2): 5 [IU] via SUBCUTANEOUS
  Administered 2022-01-28 – 2022-01-29 (×4): 2 [IU] via SUBCUTANEOUS
  Administered 2022-01-30: 3 [IU] via SUBCUTANEOUS
  Administered 2022-01-30 – 2022-01-31 (×3): 2 [IU] via SUBCUTANEOUS

## 2022-01-20 MED ORDER — SODIUM CHLORIDE 0.9 % IV SOLN
2.0000 g | INTRAVENOUS | Status: AC
  Administered 2022-01-20 – 2022-01-24 (×5): 2 g via INTRAVENOUS
  Filled 2022-01-20 (×5): qty 20

## 2022-01-20 MED ORDER — ACETAMINOPHEN 325 MG PO TABS
650.0000 mg | ORAL_TABLET | Freq: Four times a day (QID) | ORAL | Status: DC | PRN
  Administered 2022-01-22 – 2022-01-31 (×4): 650 mg via ORAL
  Filled 2022-01-20 (×4): qty 2

## 2022-01-20 MED ORDER — AZITHROMYCIN 250 MG PO TABS
500.0000 mg | ORAL_TABLET | Freq: Every day | ORAL | Status: AC
  Administered 2022-01-20 – 2022-01-24 (×4): 500 mg via ORAL
  Filled 2022-01-20 (×4): qty 2

## 2022-01-20 MED ORDER — VITAMIN D 25 MCG (1000 UNIT) PO TABS
1000.0000 [IU] | ORAL_TABLET | Freq: Every day | ORAL | Status: DC
  Administered 2022-01-21 – 2022-01-31 (×10): 1000 [IU] via ORAL
  Filled 2022-01-20 (×10): qty 1

## 2022-01-20 MED ORDER — FUROSEMIDE 10 MG/ML IJ SOLN
40.0000 mg | Freq: Every day | INTRAMUSCULAR | Status: DC
  Administered 2022-01-20 – 2022-01-21 (×2): 40 mg via INTRAVENOUS
  Filled 2022-01-20 (×2): qty 4

## 2022-01-20 MED ORDER — FENOFIBRATE 160 MG PO TABS
160.0000 mg | ORAL_TABLET | Freq: Every day | ORAL | Status: DC
  Administered 2022-01-21 – 2022-01-31 (×10): 160 mg via ORAL
  Filled 2022-01-20 (×10): qty 1

## 2022-01-20 MED ORDER — ONDANSETRON HCL 4 MG/2ML IJ SOLN: 4.0000 mg | Freq: Four times a day (QID) | INTRAMUSCULAR | Status: DC | PRN

## 2022-01-20 MED ORDER — ACETAMINOPHEN 650 MG RE SUPP: 650.0000 mg | Freq: Four times a day (QID) | RECTAL | Status: DC | PRN

## 2022-01-20 MED ORDER — ATORVASTATIN CALCIUM 80 MG PO TABS
80.0000 mg | ORAL_TABLET | Freq: Every day | ORAL | Status: DC
  Administered 2022-01-21 – 2022-01-31 (×10): 80 mg via ORAL
  Filled 2022-01-20 (×10): qty 1

## 2022-01-20 MED ORDER — ONDANSETRON HCL 4 MG PO TABS: 4.0000 mg | ORAL_TABLET | Freq: Four times a day (QID) | ORAL | Status: DC | PRN

## 2022-01-20 MED ORDER — SODIUM CHLORIDE 0.9% FLUSH
3.0000 mL | Freq: Two times a day (BID) | INTRAVENOUS | Status: DC
  Administered 2022-01-20 – 2022-01-31 (×18): 3 mL via INTRAVENOUS

## 2022-01-20 MED ORDER — DILTIAZEM HCL ER COATED BEADS 120 MG PO CP24
120.0000 mg | ORAL_CAPSULE | Freq: Every day | ORAL | Status: DC
  Administered 2022-01-21 – 2022-01-31 (×10): 120 mg via ORAL
  Filled 2022-01-20 (×10): qty 1

## 2022-01-20 NOTE — Progress Notes (Signed)
Needs to be sure to continue home care services at discharge

## 2022-01-20 NOTE — Hospital Course (Addendum)
71 year old man complicated PMH, recently diagnosed with cirrhosis with portal hypertension, recent admission in [facility] treated with pneumonia and chest tube who was admitted for volume overload, underwent thoracentesis with 2 L fluid removal as well as paracentesis.  Condition improving, plan for home health in the next 24-48 hours. 11/29:Patient was admitted 11/30:underwent IR thoracentesis with 2 L fluid removal, ascitic albumin < 1.5-unable to calculate SAAG pe GI.Gram stain culture no growth 12/2: Paracentesis 1.3 L fluid removed 12/4: Mobilizing in the hallway with PT OT but is weak and easily gets short of breath   Consultants IR Pulmonology Gastroenterology  Cardiology  Nephrology   Procedures Successful image-guided right thoracentesis. Yielded 2 L of hazy yellow fluid. 12/1 EGD 12/2 paracentesis 1.3L

## 2022-01-20 NOTE — Plan of Care (Signed)

## 2022-01-20 NOTE — H&P (Signed)
History and Physical    Dale [name] LXB:[id number] DOB: 1950/06/03 DOA: 01/20/2022  PCP: Christain Sacramento, MD   Patient coming from: Home Chief complaints: shortness of breath    HPI:71 year old male with history of CHF with preserved EF, class I obesity, atrial fibrillation, CKD stage IIIb recent baseline creatinine 21.6 in April, chronic anemia hemoglobin 10 g at baseline, chronic thrombocytopenia platelets 70 K in April, Diabetes last HbA1c 6.4 in April  and OSA on CPAP, recently diagnosed hepatic cirrhosis with portal hypertension, Recently admitted in [facility] for pulmonary edema last year and again on 12/29/2021 and discharged 11/18 after getting treatment for pneumonia and right pleural effusion apparently had a chest tube that put out total of 10 L prior to discharging, sent from Dr. Irven Shelling office as a direct admission for worsening shortness of breath and leg edema and fluid overload.  Patient was seen at Dr. Irven Shelling office 10/28 with increasing leg swelling, abdominal swelling for 3-4 days, he was nontoxic afebrile with nonlabored breathing, saturating 98% on room air and admission was requested as a direct admit. He was on lasix 40 mg bid and changed torsemide 20 mg bid 11/28 without improvement in leg swelling. He endorses orthopnea DOE. Patient otherwise denies any nausea, vomiting,  fever, chills, headache, focal weakness, numbness tingling, speech difficulties , or abdomen pain.  Patient arrived to the floor 01/20/2022 at 2:45 PM. His wife at bedside. His weight last Tuesday is up by 5lb. His current weight is 100.9kg Patient immediately seen, vitals being taken Currently no labs or imaging  and are ordered stat.  EKG Dr. Irven Shelling office A-fib with a controlled ventricular response rate at 93, LAD left anterior fascicular block, poor R wave progression.    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  Obtain routine labs/chest x-ray troponin BNP then we will start aggressive diuretic regimen with close monitoring of intake output Daily weight, monitor electrolytes.  Consult Dr. Einar Gip.   Asymmetric right pleural effusion and airspace disease in CXR: wbc also up. Will get ct chest, f/u procal until then start empiric antibiotics for CAP.  Liver cirrhosis Abdomen distension: no prior history of alcohol abuse: Likely from fatty liver his CT scan from 2012 showed severe diffuse hepatic steatosis. Obtain US Abd and consider IR tapping in am.  Chronic anemia/Thrombocytopenia:Follow-up routine labs  Chronic atrial fibrillation: Heart rate controlled, will resume his Cardizem. Not on AC. Diabetes mellitus type 2, controlled, hold glipizide add sliding scale insulin Essential hypertension: BP currently stable resume home Cardizem Hyperlipidemia: Continue Lipitor Obstructive sleep apnea: Resume CPAP bedtime Stage 3a chronic kidney disease (CKD): Baseline creatinine was 1.6 back in April, follow-up labs Class 1 obesity Body mass index is 31.91 kg/m.: advise weight loss   Severity of Illness: The appropriate patient status for this patient is INPATIENT. Inpatient status is judged to be reasonable and necessary in order to provide the required intensity of service to ensure the patient's safety. The patient's presenting symptoms, physical exam findings, and initial radiographic and laboratory data in the context of their chronic comorbidities is felt to place them at high risk for further clinical deterioration. Furthermore, it is not anticipated that the patient will be medically stable for discharge from the hospital within 2 midnights of admission.   * I certify that at the point of admission it is my clinical judgment that the patient will require inpatient  hospital care spanning beyond 2 midnights from the point of admission due to high  intensity of service, high risk for further deterioration and high frequency of surveillance required.*   DVT prophylaxis: SCDs Start: 01/20/22 1524 Code Status:   Code Status: Full Code  Family Communication: Admission, patients condition and plan of care including tests being ordered have been discussed with the patient and his wife who indicate understanding and agree with the plan and Code Status.  Consults called:  cardiology  Review of Systems: All systems were reviewed and were negative except as mentioned in HPI above. Negative for fever Negative for chest pain Negative for cough  Past Medical History:  Diagnosis Date   Arthritis    CHF (congestive heart failure) (HCC)    Chronic kidney disease    Diabetes mellitus without complication ([place])    Dysrhythmia    A. Fib   Epigastric hernia    Gout    Hyperlipidemia    Hypertension    Sleep apnea    Tubular adenoma of colon 12/2010    Past Surgical History:  Procedure Laterality Date   CATARACT EXTRACTION W/ INTRAOCULAR LENS IMPLANT     right   HIP SURGERY     INGUINAL HERNIA REPAIR  02/22/2009   left   INGUINAL HERNIA REPAIR Right 06/24/2021   Procedure: RIGHT INGUINAL HERNIA REPAIR;  Surgeon: Donnie Mesa, MD;  Location: [place];  Service: General;  Laterality: Right;  LMA AND TAP BLOCK   INSERTION OF MESH Right 06/24/2021   Procedure: INSERTION OF MESH;  Surgeon: Donnie Mesa, MD;  Location: [place];  Service: General;  Laterality: Right;   KNEE ARTHROSCOPY  02/23/2008   left     reports that he quit smoking about 23 years ago. His smoking use included cigars and cigarettes. He has a 15.00 pack-year smoking history. He quit smokeless tobacco use about 23 years ago.  His smokeless tobacco use included chew. He reports that he does not drink alcohol and does not use drugs.  Allergies  Allergen Reactions   Allopurinol Other (See Comments)    Foot pain, not a rash., 19 Mar 2010    Family History  Problem Relation  Age of Onset   Diabetes Mother    Lung cancer Father      Prior to Admission medications   Medication Sig Start Date End Date Taking? Authorizing Provider  ACCU-CHEK GUIDE test strip  05/16/19   [provider]  atorvastatin (LIPITOR) 80 MG tablet Take 80 mg by mouth daily. 10/12/18   [provider]  cholecalciferol (VITAMIN D3) 25 MCG (1000 UNIT) tablet Take 1,000 Units by mouth daily.    [provider]  diltiazem (CARDIZEM CD) 120 MG 24 hr capsule Take 120 mg by mouth daily. 01/08/22 02/07/22  [provider]  fenofibrate (TRICOR) 145 MG tablet Take 145 mg by mouth daily.    [provider]  glipiZIDE (GLUCOTROL) 5 MG tablet Take 5 mg by mouth daily before breakfast.    [provider]  Lancets (ONETOUCH ULTRASOFT) lancets Test blood sugar twice a day and as needed 10/01/16   [provider]  torsemide (DEMADEX) 20 MG tablet Take 1 tablet (20 mg total) by mouth 2 (two) times daily. 01/19/22 02/18/22  Adrian Prows, MD    Physical Exam: Vitals:   01/20/22 1510  BP: 117/74  Pulse: 89  Resp: 18  SpO2: 97%  Weight: 100.9 kg  Height: 5' 10"  (1.778 m)  General exam: AAOx3, pleasant, not in distress D, weak appearing. HEENT:Oral mucosa moist, Ear/Nose WNL grossly, dentition normal. Respiratory system: bilaterally diminished at base, no wheezing or crackles,no use of accessory muscle Cardiovascular system: S1 & S2 +, No JVD,. Gastrointestinal system: Abdomen soft, distended, BS+ Nervous System:Alert, awake, moving extremities and grossly nonfocal Extremities: b/l LE edema ++, distal peripheral pulses palpable.  Skin: No rashes,no icterus. MSK: Normal muscle bulk,tone, power   Labs on Admission: I have personally reviewed following labs and imaging studies  CBC: Recent Labs  Lab 01/20/22 1512  WBC 12.7*  HGB 13.8  HCT 39.6  MCV 95.7  PLT 89*   Basic Metabolic Panel: No results for input(s): "NA", "K", "CL",  "CO2", "GLUCOSE", "BUN", "CREATININE", "CALCIUM", "MG", "PHOS" in the last 168 hours. GFR: CrCl cannot be calculated (Patient's most recent lab result is older than the maximum 21 days allowed.). Liver Function Tests: No results for input(s): "AST", "ALT", "ALKPHOS", "BILITOT", "PROT", "ALBUMIN" in the last 168 hours. No results for input(s): "LIPASE", "AMYLASE" in the last 168 hours. No results for input(s): "AMMONIA" in the last 168 hours. Coagulation Profile: No results for input(s): "INR", "PROTIME" in the last 168 hours. Cardiac Enzymes: No results for input(s): "CKTOTAL", "CKMB", "CKMBINDEX", "TROPONINI" in the last 168 hours. BNP (last 3 results) No results for input(s): "PROBNP" in the last 8760 hours. HbA1C: No results for input(s): "HGBA1C" in the last 72 hours. CBG: Recent Labs  Lab 01/20/22 1646  GLUCAP 190*   Lipid Profile: No results for input(s): "CHOL", "HDL", "LDLCALC", "TRIG", "CHOLHDL", "LDLDIRECT" in the last 72 hours. Thyroid Function Tests: No results for input(s): "TSH", "T4TOTAL", "FREET4", "T3FREE", "THYROIDAB" in the last 72 hours. Anemia Panel: No results for input(s): "VITAMINB12", "FOLATE", "FERRITIN", "TIBC", "IRON", "RETICCTPCT" in the last 72 hours. Urine analysis:    Component Value Date/Time   COLORURINE YELLOW 07/13/2010 1819   APPEARANCEUR CLEAR 07/13/2010 1819   LABSPEC 1.005 07/13/2010 1819   PHURINE 7.0 07/13/2010 1819   GLUCOSEU NEGATIVE 07/13/2010 1819   HGBUR MODERATE (A) 07/13/2010 1819   BILIRUBINUR NEGATIVE 07/13/2010 1819   KETONESUR NEGATIVE 07/13/2010 1819   PROTEINUR NEGATIVE 07/13/2010 1819   UROBILINOGEN 1.0 07/13/2010 1819   NITRITE NEGATIVE 07/13/2010 1819   LEUKOCYTESUR NEGATIVE 07/13/2010 1819    Radiological Exams on Admission: [place] ASCITES (ABDOMEN LIMITED)  Result Date: 01/20/2022 CLINICAL DATA:  Ascites EXAM: LIMITED ABDOMEN ULTRASOUND FOR ASCITES TECHNIQUE: Limited ultrasound survey for ascites was performed in  all four abdominal quadrants. COMPARISON:  01/20/2021 FINDINGS: Moderate volume ascites within the abdomen, largest pocket seen within the right upper quadrant. IMPRESSION: Moderate volume ascites. Electronically Signed   By: Davina Poke D.O.   On: 01/20/2022 16:19   DG Chest Port 1 View  Result Date: 01/20/2022 CLINICAL DATA:  Acute exacerbation of congestive heart failure. EXAM: PORTABLE CHEST 1 VIEW COMPARISON:  None Available. FINDINGS: Heart is enlarged. Lung volumes are low. Asymmetric right pleural effusion and airspace disease is present. Mild pulmonary vascular congestion is present on the left. No significant left-sided edema or effusion is present. Degenerative changes are present at both shoulders. IMPRESSION: 1. Asymmetric right pleural effusion and airspace disease is concerning for infection or aspiration. 2. Cardiomegaly and mild pulmonary vascular congestion. Given the asymmetry, this does not appear to be congestive heart failure. Electronically Signed   By: San Morelle M.D.   On: 01/20/2022 15:54      Antonieta Pert MD [facility]  If 7PM-7AM, please contact night-coverage [email or web address]  01/20/2022, 4:58 PM

## 2022-01-21 ENCOUNTER — Inpatient Hospital Stay (HOSPITAL_COMMUNITY): Payer: Medicare Other

## 2022-01-21 ENCOUNTER — Encounter: Payer: Self-pay | Admitting: *Deleted

## 2022-01-21 ENCOUNTER — Other Ambulatory Visit (HOSPITAL_COMMUNITY): Payer: No Typology Code available for payment source

## 2022-01-21 DIAGNOSIS — J948 Other specified pleural conditions: Secondary | ICD-10-CM | POA: Insufficient documentation

## 2022-01-21 DIAGNOSIS — L8990 Pressure ulcer of unspecified site, unspecified stage: Secondary | ICD-10-CM | POA: Insufficient documentation

## 2022-01-21 DIAGNOSIS — J90 Pleural effusion, not elsewhere classified: Secondary | ICD-10-CM

## 2022-01-21 DIAGNOSIS — I5033 Acute on chronic diastolic (congestive) heart failure: Secondary | ICD-10-CM | POA: Diagnosis not present

## 2022-01-21 DIAGNOSIS — R188 Other ascites: Secondary | ICD-10-CM

## 2022-01-21 DIAGNOSIS — N1832 Chronic kidney disease, stage 3b: Secondary | ICD-10-CM

## 2022-01-21 DIAGNOSIS — N179 Acute kidney failure, unspecified: Secondary | ICD-10-CM

## 2022-01-21 DIAGNOSIS — K7460 Unspecified cirrhosis of liver: Secondary | ICD-10-CM | POA: Insufficient documentation

## 2022-01-21 HISTORY — PX: IR THORACENTESIS ASP PLEURAL SPACE W/IMG GUIDE: IMG5380

## 2022-01-21 LAB — LACTATE DEHYDROGENASE: LDH: 287 U/L — ABNORMAL HIGH (ref 98–192)

## 2022-01-21 LAB — BODY FLUID CELL COUNT WITH DIFFERENTIAL
Lymphs, Fluid: 28 %
Monocyte-Macrophage-Serous Fluid: 55 % (ref 50–90)
Neutrophil Count, Fluid: 17 % (ref 0–25)
Total Nucleated Cell Count, Fluid: 81 cu mm (ref 0–1000)

## 2022-01-21 LAB — HEPATITIS A ANTIBODY, TOTAL: hep A Total Ab: REACTIVE — AB

## 2022-01-21 LAB — CBC
HCT: 34.7 % — ABNORMAL LOW (ref 39.0–52.0)
Hemoglobin: 12.3 g/dL — ABNORMAL LOW (ref 13.0–17.0)
MCH: 34.1 pg — ABNORMAL HIGH (ref 26.0–34.0)
MCHC: 35.4 g/dL (ref 30.0–36.0)
MCV: 96.1 fL (ref 80.0–100.0)
Platelets: 48 10*3/uL — ABNORMAL LOW (ref 150–400)
RBC: 3.61 MIL/uL — ABNORMAL LOW (ref 4.22–5.81)
RDW: 13.9 % (ref 11.5–15.5)
WBC: 8.5 10*3/uL (ref 4.0–10.5)
nRBC: 0 % (ref 0.0–0.2)

## 2022-01-21 LAB — PROTEIN, PLEURAL OR PERITONEAL FLUID: Total protein, fluid: <3 g/dL

## 2022-01-21 LAB — BASIC METABOLIC PANEL
Anion gap: 12 (ref 5–15)
BUN: 74 mg/dL — ABNORMAL HIGH (ref 8–23)
CO2: 20 mmol/L — ABNORMAL LOW (ref 22–32)
Calcium: 8.5 mg/dL — ABNORMAL LOW (ref 8.9–10.3)
Chloride: 100 mmol/L (ref 98–111)
Creatinine, Ser: 2.48 mg/dL — ABNORMAL HIGH (ref 0.61–1.24)
GFR, Estimated: 27 mL/min — ABNORMAL LOW (ref >60)
Glucose, Bld: 188 mg/dL — ABNORMAL HIGH (ref 70–99)
Potassium: 3.7 mmol/L (ref 3.5–5.1)
Sodium: 132 mmol/L — ABNORMAL LOW (ref 135–145)

## 2022-01-21 LAB — GRAM STAIN

## 2022-01-21 LAB — HEPATIC FUNCTION PANEL
ALT: 71 U/L — ABNORMAL HIGH (ref 0–44)
AST: 98 U/L — ABNORMAL HIGH (ref 15–41)
Albumin: 2.2 g/dL — ABNORMAL LOW (ref 3.5–5.0)
Alkaline Phosphatase: 98 U/L (ref 38–126)
Bilirubin, Direct: 0.5 mg/dL — ABNORMAL HIGH (ref 0.0–0.2)
Indirect Bilirubin: 0.8 mg/dL (ref 0.3–0.9)
Total Bilirubin: 1.3 mg/dL — ABNORMAL HIGH (ref 0.3–1.2)
Total Protein: 6.5 g/dL (ref 6.5–8.1)

## 2022-01-21 LAB — GLUCOSE, CAPILLARY
Glucose-Capillary: 152 mg/dL — ABNORMAL HIGH (ref 70–99)
Glucose-Capillary: 171 mg/dL — ABNORMAL HIGH (ref 70–99)
Glucose-Capillary: 167 mg/dL — ABNORMAL HIGH (ref 70–99)
Glucose-Capillary: 177 mg/dL — ABNORMAL HIGH (ref 70–99)

## 2022-01-21 LAB — PROTIME-INR
INR: 1.8 — ABNORMAL HIGH (ref 0.8–1.2)
Prothrombin Time: 20.6 seconds — ABNORMAL HIGH (ref 11.4–15.2)

## 2022-01-21 LAB — ALBUMIN, PLEURAL OR PERITONEAL FLUID: Albumin, Fluid: <1.5 g/dL

## 2022-01-21 LAB — HEPATITIS B SURFACE ANTIBODY,QUALITATIVE: Hep B S Ab: NONREACTIVE

## 2022-01-21 LAB — LACTATE DEHYDROGENASE, PLEURAL OR PERITONEAL FLUID: LD, Fluid: 30 U/L — ABNORMAL HIGH (ref 3–23)

## 2022-01-21 LAB — HEMOGLOBIN A1C
Hgb A1c MFr Bld: 6.4 % — ABNORMAL HIGH (ref 4.8–5.6)
Mean Plasma Glucose: 137 mg/dL

## 2022-01-21 LAB — PROCALCITONIN: Procalcitonin: 0.51 ng/mL

## 2022-01-21 MED ORDER — ALBUMIN HUMAN 25 % IV SOLN
50.0000 g | Freq: Two times a day (BID) | INTRAVENOUS | Status: AC
  Administered 2022-01-21 – 2022-01-22 (×2): 50 g via INTRAVENOUS
  Filled 2022-01-21 (×2): qty 200

## 2022-01-21 MED ORDER — LIDOCAINE HCL 1 % IJ SOLN
INTRAMUSCULAR | Status: AC
  Administered 2022-01-21: 8 mL
  Filled 2022-01-21: qty 20

## 2022-01-21 NOTE — Research (Unsigned)
[facility] Informed Consent   Subject Name: Dale [name]  Subject met inclusion and exclusion criteria.  The informed consent form, study requirements and expectations were reviewed with the subject and questions and concerns were addressed prior to the signing of the consent form.  The subject verbalized understanding of the trial requirements.  The subject agreed to participate in the [facility] trial and signed the informed consent on 01-21-2022.  The informed consent was obtained prior to performance of any protocol-specific procedures for the subject.  A copy of the signed informed consent was given to the subject and a copy was placed in the subject's medical record.   [place] Sanjay Broadfoot, Research Coordinator  01/21/2022  14:49 p.m.

## 2022-01-21 NOTE — Procedures (Addendum)
PROCEDURE SUMMARY:  Successful image-guided right thoracentesis. Yielded 2 L of hazy yellow fluid. Pt tolerated procedure well. No immediate complications. EBL = trace   Specimen was  sent for labs. CXR ordered.  Patient states that he had thoracentesis done in [place].  Post procedure US showed moderate residual right pleural effusion.   Please see imaging section of Epic for full dictation.  Armando Gang Jeslyn Amsler PA-C 01/21/2022 8:52 AM

## 2022-01-21 NOTE — Procedures (Signed)
IR was requested for image guided paracentesis.   Patient had right thoracentesis today, removed 2L of pleural effusion.  Typically thoracentesis and paracentesis cannot be performed on the same day.    Patient is scheduled for paracentesis tomorrow, please call IR for questions and concerns.   Armando Gang Margareta Laureano PA-C 01/21/2022 12:06 PM

## 2022-01-21 NOTE — Consult Note (Addendum)
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  He returns to [facility] and after 4 days his shortness of breath increased along with orthopnea lower extremity edema he was admitted for further evaluation and treatment noted to have a new right pleural effusion.  Thoracentesis performed 01/21/2022 with 2 L of fluid obtained with a total of 12 L over the last month of pleural fluid drainage.  He is notable for liver dysfunction along with ascites on films and distinct fluid wave on evaluation.  Pulmonary critical care asked to evaluate. Echocardiogram 01/03/2022: Normal LV size, mild LVH, normal wall motion.  EF 60 to 65%. Normal RV size and function. Mild mitral and calcification.  Tricuspid valve is normal.  No pulm hypertension. Pertinent  Medical History   Past Medical History:  Diagnosis Date   Arthritis    CHF (congestive heart failure) ([place])    Chronic kidney disease    Diabetes mellitus without complication ([place])    Dysrhythmia    A. Fib   Epigastric hernia    Gout    Hyperlipidemia    Hypertension    Sleep apnea    Tubular adenoma of colon 12/2010     Significant Hospital Events: Including procedures, antibiotic start and stop dates in addition to other pertinent events   01/21/2022 thoracentesis right for 2 L  Interim History / Subjective:  Feels much better after thoracentesis  Objective   Blood pressure 126/81, pulse 90, temperature 98 F (36.7 C), temperature source Oral, resp. rate  18, height 5' 10"  (1.778 m), weight 100.9 kg, SpO2 95 %.        Intake/Output Summary (Last 24 hours) at 01/21/2022 0943 Last data filed at 01/21/2022 1443 Gross per 24 hour  Intake 102.67 ml  Output --  Net 102.67 ml   Filed Weights   01/20/22 1510  Weight: 100.9 kg    Examination: General: Obese male who reports breathing better after thoracentesis HENT: No JVD is appreciated Lungs: Diminished in the bases Cardiovascular: Heart sounds are regular Abdomen: Soft nontender positive fluid wave Extremities: 2+ edema Neuro: Grossly intact without focal defect GU: Voids  Resolved Hospital Problem list     Assessment & Plan:  Recurrent right pleural effusion status post 10 L removal 12/29/2021 per pigtail chest tube and 2 L removed today 01/21/2022 in the setting of congestive heart failure, ascites with cirrhosis.  Atrial fibrillation Albumin added to pleural fluid studies Hepatic profile added Cardiology's note about right heart and left heart catheterization as noted Last 2D echo EF of 50 to 60% with grade 1 diastolic dysfunction No drinking history Await results of pleural fluid studies  Hyponatremia Recent Labs  Lab 01/20/22 1512  NA 133*   Monitor   Obstructive sleep apnea CPAP compliant Continue CPAP  Chronic kidney disease 14 Lab Results  Component Value Date   CREATININE 2.50 (H) 01/20/2022   CREATININE 1.61 (H) 06/15/2021   CREATININE 2.37 (H) 10/31/2020  Per primary   Diabetes mellitus Per primary  Chronic anemia Per primary  Best Practice (right click and "Reselect all SmartList Selections" daily)   Diet/type: Regular consistency (see orders) DVT prophylaxis: not indicated GI prophylaxis: PPI Lines: N/A Foley:  N/A Code Status:  full code Last date of multidisciplinary goals of care discussion [tbd]  Labs   CBC: Recent Labs  Lab 01/20/22 1512  WBC 12.7*  HGB 13.8  HCT 39.6  MCV 95.7  PLT 89*    Basic Metabolic Panel: Recent  Labs  Lab 01/20/22 1512  NA 133*  K 3.7  CL 94*  CO2 19*  GLUCOSE 198*  BUN 69*  CREATININE 2.50*  CALCIUM 9.4  MG 2.1   GFR: Estimated Creatinine Clearance: 32.3 mL/min (A) (by C-G formula based on SCr of 2.5 mg/dL (H)). Recent Labs  Lab 01/20/22 1512 01/21/22 0115  PROCALCITON 0.54 0.51  WBC 12.7*  --     Liver Function Tests: Recent Labs  Lab 01/20/22 1512  AST 110*  ALT 79*  ALKPHOS 117  BILITOT 1.8*  PROT 7.3  ALBUMIN 2.7*   No results for input(s): "LIPASE", "AMYLASE" in the last 168 hours. No results for input(s): "AMMONIA" in the last 168 hours.  ABG    Component Value Date/Time   TCO2 24 07/13/2010 2236     Coagulation Profile: No results for input(s): "INR", "PROTIME" in the last 168 hours.  Cardiac Enzymes: No results for input(s): "CKTOTAL", "CKMB", "CKMBINDEX", "TROPONINI" in the last 168 hours.  HbA1C: Hgb A1c MFr Bld  Date/Time Value Ref Range Status  01/20/2022 03:03 PM 6.4 (H) 4.8 - 5.6 % Final    Comment:    (NOTE)         Prediabetes: 5.7 - 6.4         Diabetes: >6.4         Glycemic control for adults with diabetes: <7.0   06/15/2021 08:23 AM 6.4 (H) 4.8 - 5.6 % Final    Comment:    (NOTE) Pre diabetes:          5.7%-6.4%  Diabetes:              >6.4%  Glycemic control for   <7.0% adults with diabetes     CBG: Recent Labs  Lab 01/20/22 1646 01/20/22 2113 01/21/22 0627  GLUCAP 190* 166* 152*    Review of Systems:   10 point review of system taken, please see HPI for positives and negatives. Positive for increasing shortness of breath over 4 days Denies sputum production Positive for orthopnea  Past Medical History:  He,  has a past medical history of Arthritis, CHF (congestive heart failure) ([place]), Chronic kidney disease, Diabetes mellitus without complication ([place]), Dysrhythmia, Epigastric hernia, Gout, Hyperlipidemia, Hypertension, Sleep apnea, and Tubular adenoma of colon (12/2010).   Surgical History:    Past Surgical History:  Procedure Laterality Date   CATARACT EXTRACTION W/ INTRAOCULAR LENS IMPLANT     right   HIP SURGERY     INGUINAL HERNIA REPAIR  02/22/2009   left   INGUINAL HERNIA REPAIR Right 06/24/2021   Procedure: RIGHT INGUINAL HERNIA REPAIR;  Surgeon: Donnie Mesa, MD;  Location: [facility];  Service: General;  Laterality: Right;  LMA AND TAP BLOCK   INSERTION OF MESH Right 06/24/2021   Procedure: INSERTION OF MESH;  Surgeon: Donnie Mesa, MD;  Location: [place];  Service: General;  Laterality: Right;   KNEE ARTHROSCOPY  02/23/2008   left     Social History:   reports that he quit smoking about 23 years ago.  His smoking use included cigars and cigarettes. He has a 15.00 pack-year smoking history. He quit smokeless tobacco use about 23 years ago.  His smokeless tobacco use included chew. He reports that he does not drink alcohol and does not use drugs.   Family History:  His family history includes Diabetes in his mother; Lung cancer in his father.   Allergies Allergies  Allergen Reactions   Nsaids Other (See Comments)    NOT to take these due to kidney function   Allopurinol Other (See Comments)    Foot pain, not a rash., 19 Mar 2010     Home Medications  Prior to Admission medications   Medication Sig Start Date End Date Taking? Authorizing Provider  atorvastatin (LIPITOR) 80 MG tablet Take 80 mg by mouth daily. 10/12/18  Yes [provider]  cholecalciferol (VITAMIN D3) 25 MCG (1000 UNIT) tablet Take 1,000 Units by mouth daily.   Yes [provider]  diltiazem (CARDIZEM CD) 120 MG 24 hr capsule Take 120 mg by mouth daily. 01/08/22 02/07/22 Yes [provider]  fenofibrate (TRICOR) 145 MG tablet Take 145 mg by mouth daily.   Yes [provider]  glipiZIDE (GLUCOTROL XL) 5 MG 24 hr tablet Take 5 mg by mouth daily with breakfast.   Yes [provider]  torsemide (DEMADEX) 20 MG tablet Take 1 tablet (20 mg total) by mouth 2  (two) times daily. Patient taking differently: Take 20 mg by mouth in the morning and at bedtime. 01/19/22 02/18/22 Yes Adrian Prows, MD  ACCU-CHEK GUIDE test strip  05/16/19   [provider]  Lancets Glory Rosebush ULTRASOFT) lancets Test blood sugar twice a day and as needed 10/01/16   [provider]     Critical care time: Ferol Luz Tika Hannis ACNP Acute Care Nurse Practitioner [place] Please consult [place] 01/21/2022, 9:43 AM

## 2022-01-21 NOTE — Progress Notes (Signed)
Heart Failure Navigator Progress Note  Assessed for Heart & Vascular TOC clinic readiness.  Patient does not meet criteria due to prior to hospitalization pt established with [facility]. Dr. Einar Gip following/consulted this hospitalization.   Navigator available for reassessment of patient or to schedule close follow up if needed.   Pricilla Holm, MSN, RN Heart Failure Nurse Navigator

## 2022-01-21 NOTE — Progress Notes (Signed)
Patient placed on CPAP at this time.

## 2022-01-21 NOTE — Progress Notes (Signed)
   01/21/22 1600  Mobility  Activity Refused mobility   Mobility Specialist Progress Note  Pt refused mobility d/t " wanting to wait until tomorrow". Will f/u as able.    Lucious Groves Mobility Specialist  Please contact via SecureChat or Rehab office at [phone]

## 2022-01-21 NOTE — Progress Notes (Addendum)
PROGRESS NOTE Dale [name]  KVQ:[id number] DOB: 11-27-1950 DOA: 01/20/2022 PCP: Christain Sacramento, MD   Brief Narrative/Hospital Course: 71 year old male with history of CHF with preserved EF, class I obesity, atrial fibrillation, CKD stage IIIb recent baseline creatinine 21.6 in April, chronic anemia hemoglobin 10 g at baseline, chronic thrombocytopenia platelets 70 K in April, Diabetes last HbA1c 6.4 in April  and OSA on CPAP, recently diagnosed hepatic cirrhosis with portal hypertension, Recently admitted in [facility] for pulmonary edema last year and again on 12/29/2021 and discharged 11/18 after getting treatment for pneumonia and right pleural effusion apparently had a chest tube that put out total of 10 L prior to discharging, sent from Dr. Irven Shelling office as a direct admission for worsening shortness of breath and leg edema and fluid overload. Patient was seen at Dr. Irven Shelling office 10/28 with increasing leg swelling, abdominal swelling for 3-4 days, he was nontoxic afebrile with nonlabored breathing, saturating 98% on room air and admission was requested as a direct admit. He was on lasix 40 mg bid and changed torsemide 20 mg bid 11/28 without improvement in leg swelling. He endorses orthopnea DOE. Patient otherwise denies any nausea, vomiting,  fever, chills, headache, focal weakness, numbness tingling, speech difficulties , or abdomen pain. EKG Dr. Irven Shelling office A-fib with a controlled ventricular response rate at 93, LAD left anterior fascicular block, poor R wave progression.    Subjective: Seen and examined this morning Overnight afebrile, patient is was on CPAP for night Patient went for ultrasound-guided thoracentesis this morning- 2l hazy yellow fluid removed He reports he feels much improved after thoracentesis.  Assessment and Plan:  Acute on chronic diastolic IEP:PIRJJOA having worsening shortness of breath orthopnea and lower leg edema, failed outpatient Lasix and torsemide.   Has bump in creatinine from baseline.  Cardiology has been consulted  await for further recommendation, hold Lasix until then, bmp pending.Monitor intake output Daily weight Net IO Since Admission: 102.67 mL [01/21/22 1132]  Filed Weights   01/20/22 1510  Weight: 100.9 kg     Possible pneumonia Large right pleural effusion with complete collapse of the right middle lower lobe subtotal collapse of the right upper lobe resulting mediastinal shift to the left: I have consulted pulmonary, patient not in respiratory distress or not hypoxic.   S/p ultrasound-guided thoracentesis this morning- 2l hazy yellow fluid removed: Follow-up cytology Gram stain culture and fluid analysis, SAAG.Given his leukocytosis and elevated procalcitonin continue empiric ceftriaxone azithromycin for community-acquired pneumonia.  Likely hepatic hydrothorax: Less likely to improve,of note he had chest tube placed at [facility] 2 weeks ago.  GI has been consulted.  Discussed with pulmonary palliative care also consulted  AKI on Stage 3a chronic kidney disease (CKD): Baseline creatinine was 1.6- 1.8 recently.  Creatinine elevated on admission hold off on further Lasix pending BMP cardiology and GI evaluation. Recent Labs  Lab 01/20/22 1512  BUN 69*  CREATININE 2.50*    Liver cirrhosis with moderate volume ascites: no prior history of alcohol abuse,likely from fatty liver his CT scan from 2012 showed severe diffuse hepatic steatosis.  Diuresis as renal function allows likely need paracentesis GI has been consulted await further input. Will need aldactone/lasix.   Chronic anemia/Thrombocytopenia: Close to baseline monitor cbc  Recent Labs  Lab 01/20/22 1512  PLT 89*   Mildly elevated troponin no significant chest pain check EKG, troponin flat/decreasing likely demand ischemia in the setting of patient's congestive heart failure   Chronic atrial fibrillation: Rate controlled on  Cardizem not on anticoagulation > Dr.  Einar Gip has been advised to start heparin but 2/2 drop in platelet counts holding heparin for now.  Diabetes mellitus type 2, controlled, hold glipizide- cont sliding scale insulin Recent Labs  Lab 01/20/22 1503 01/20/22 1646 01/20/22 2113 01/21/22 0627 01/21/22 1108  GLUCAP  --  190* 166* 152* 171*  HGBA1C 6.4*  --   --   --   --     Essential hypertension: BP controlled on Cardizem.  Hyperlipidemia: Continue Lipitor Obstructive sleep apnea: cont CPAP bedtime Class 1 obesity Body mass index is 31.91 kg/m.Will benefit with PCP follow-up, weight loss  healthy lifestyle and cont cpap Pressure injury left buttock POA Pressure Injury 01/20/22 Buttocks Left Stage 2 -  Partial thickness loss of dermis presenting as a shallow open injury with a red, pink wound bed without slough. round 1 cm left buttock (Active)  01/20/22 1550  Location: Buttocks  Location Orientation: Left  Staging: Stage 2 -  Partial thickness loss of dermis presenting as a shallow open injury with a red, pink wound bed without slough.  Wound Description (Comments): round 1 cm left buttock  Present on Admission:   Dressing Type Foam - Lift dressing to assess site every shift 01/20/22 1927    DVT prophylaxis: SCDs Start: 01/20/22 1524 holding off on anticoagulation due to thrombocytopenia Code Status:   Code Status: Full Code Family Communication: plan of care discussed with patient at bedside. Patient status is: Inpatient because of acute on chronic CHF pleural effusion cirrhosis ascites  Level of care: Telemetry Cardiac  Dispo: The patient is from: home            Anticipated disposition: TBD Objective: Vitals last 24 hrs: Vitals:   01/20/22 1923 01/21/22 0019 01/21/22 0821 01/21/22 0941  BP: 138/82 119/82 126/81 118/78  Pulse: 96 90    Resp: 18 18    Temp: (!) 97.3 F (36.3 C) 98 F (36.7 C)    TempSrc: Oral Oral    SpO2: 93% 95% 95%   Weight:      Height:       Weight change:   Physical  Examination: General exam: alert awake, older than stated age HEENT:Oral mucosa moist, Ear/Nose WNL grossly Respiratory system: bilaterally diminished at bases, no use of accessory muscle Cardiovascular system: S1 & S2 +, No JVD. Gastrointestinal system: Abdomen soft,NT,ND, BS+ Nervous System:Alert, awake, moving extremities. Extremities: LE edema +,distal peripheral pulses palpable.  Skin: No rashes,no icterus. MSK: Normal muscle bulk,tone, power  Medications reviewed:  Scheduled Meds:  atorvastatin  80 mg Oral Daily   azithromycin  500 mg Oral Daily   cholecalciferol  1,000 Units Oral Daily   diltiazem  120 mg Oral Daily   fenofibrate  160 mg Oral Daily   furosemide  40 mg Intravenous Daily   insulin aspart  0-9 Units Subcutaneous TID WC   sodium chloride flush  3 mL Intravenous Q12H   Continuous Infusions:  cefTRIAXone (ROCEPHIN)  IV Stopped (01/20/22 1950)   Diet Order             Diet heart healthy/carb modified Room service appropriate? Yes; Fluid consistency: Thin; Fluid restriction: 1200 mL Fluid  Diet effective now                  Intake/Output Summary (Last 24 hours) at 01/21/2022 1131 Last data filed at 01/21/2022 0651 Gross per 24 hour  Intake 102.67 ml  Output --  Net 102.67 ml  Net IO Since Admission: 102.67 mL [01/21/22 1131]  Wt Readings from Last 3 Encounters:  01/20/22 100.9 kg  01/19/22 102.9 kg  11/19/21 101.2 kg     Unresulted Labs (From admission, onward)     Start     Ordered   01/22/22 3785  Basic metabolic panel  Daily at 5am,   R     Question:  Specimen collection method  Answer:  IV Team=IV Team collect   01/21/22 0747   01/22/22 0500  CBC  Daily at 5am,   R     Question:  Specimen collection method  Answer:  IV Team=IV Team collect   01/21/22 0747   01/21/22 1056  Protime-INR  Daily at 5am,   R     Question:  Specimen collection method  Answer:  IV Team=IV Team collect   01/21/22 1055   01/21/22 0947  Hepatic function panel   Once,   R       Question:  Specimen collection method  Answer:  IV Team=IV Team collect   01/21/22 0946   01/21/22 0940  Albumin, pleural or peritoneal fluid   Once,   R        01/21/22 0939   01/21/22 8850  Basic metabolic panel  Once,   STAT        01/21/22 0900   01/21/22 0900  CBC  Once,   R        01/21/22 0900   01/21/22 0836  Culture, body fluid w Gram Stain-bottle  Once,   R        01/21/22 0836   01/21/22 0500  Procalcitonin  Daily at 5am,   R      01/20/22 1453   01/20/22 1656  Legionella Pneumophila Serogp 1 Ur Ag  Once,   R        01/20/22 1656   01/20/22 1656  Strep pneumoniae urinary antigen  Once,   R        01/20/22 1656   01/20/22 1656  Expectorated Sputum Assessment w Gram Stain, Rflx to Resp Cult  Once,   R        01/20/22 1656          Data Reviewed: I have personally reviewed following labs and imaging studies CBC: Recent Labs  Lab 01/20/22 1512  WBC 12.7*  HGB 13.8  HCT 39.6  MCV 95.7  PLT 89*   Basic Metabolic Panel: Recent Labs  Lab 01/20/22 1512  NA 133*  K 3.7  CL 94*  CO2 19*  GLUCOSE 198*  BUN 69*  CREATININE 2.50*  CALCIUM 9.4  MG 2.1   GFR: Estimated Creatinine Clearance: 32.3 mL/min (A) (by C-G formula based on SCr of 2.5 mg/dL (H)). Liver Function Tests: Recent Labs  Lab 01/20/22 1512  AST 110*  ALT 79*  ALKPHOS 117  BILITOT 1.8*  PROT 7.3  ALBUMIN 2.7*  HbA1C: Recent Labs    01/20/22 1503  HGBA1C 6.4*   No results found for this or any previous visit (from the past 240 hour(s)).  Antimicrobials: Anti-infectives (From admission, onward)    Start     Dose/Rate Route Frequency Ordered Stop   01/20/22 1745  cefTRIAXone (ROCEPHIN) 2 g in sodium chloride 0.9 % 100 mL IVPB        2 g 200 mL/hr over 30 Minutes Intravenous Every 24 hours 01/20/22 1656 01/25/22 1744   01/20/22 1745  azithromycin (ZITHROMAX) tablet 500 mg  500 mg Oral Daily 01/20/22 1656 01/25/22 0959      Culture/Microbiology No results  found for: "SDES", "SPECREQUEST", "CULT", "REPTSTATUS"  Other culture-see note  Radiology Studies: IR THORACENTESIS ASP PLEURAL SPACE W/IMG GUIDE  Result Date: 01/21/2022 INDICATION: Patient with history of CHF presents with shortness of breath, previous CT showed large right pleural effusion. Request for therapeutic and diagnostic thoracentesis. EXAM: ULTRASOUND GUIDED RIGHT THORACENTESIS MEDICATIONS: 5 mL 1% lidocaine COMPLICATIONS: None immediate. PROCEDURE: An ultrasound guided thoracentesis was thoroughly discussed with the patient and questions answered. The benefits, risks, alternatives and complications were also discussed. The patient understands and wishes to proceed with the procedure. Written consent was obtained. Ultrasound was performed to localize and mark an adequate pocket of fluid in the right chest. The area was then prepped and draped in the normal sterile fashion. 1% Lidocaine was used for local anesthesia. Under ultrasound guidance a 6 Fr Safe-T-Centesis catheter was introduced. Thoracentesis was performed. The catheter was removed and a dressing applied. FINDINGS: A total of approximately 2 L of hazy yellow fluid was removed. Samples were sent to the laboratory as requested by the clinical team. Post procedure chest X-ray reviewed, negative for pneumothorax. IMPRESSION: Successful ultrasound guided right thoracentesis yielding 2 L of pleural fluid. Read by: Durenda Guthrie, PA-C Electronically Signed   By: Jerilynn Mages.  Shick M.D.   On: 01/21/2022 10:51   DG Chest 1 View  Result Date: 01/21/2022 CLINICAL DATA:  Status post right-sided thoracentesis EXAM: CHEST  1 VIEW COMPARISON:  01/20/2022 chest CT.  Plain film 01/20/2022 FINDINGS: Midline trachea. Normal heart size. Moderate right pleural effusion is relatively similar to the prior plain film. No pneumothorax. No left-sided pleural effusion. No congestive failure. Right lower lung airspace disease is not significantly changed. IMPRESSION: No  pneumothorax after thoracentesis. Similar moderate right-sided pleural effusion with adjacent airspace disease. Electronically Signed   By: Abigail Miyamoto M.D.   On: 01/21/2022 08:50   CT CHEST WO CONTRAST  Result Date: 01/20/2022 CLINICAL DATA:  Shortness of breath (Ped 0-17y) EXAM: CT CHEST WITHOUT CONTRAST TECHNIQUE: Multidetector CT imaging of the chest was performed following the standard protocol without IV contrast. RADIATION DOSE REDUCTION: This exam was performed according to the departmental dose-optimization program which includes automated exposure control, adjustment of the mA and/or kV according to patient size and/or use of iterative reconstruction technique. COMPARISON:  None Available. FINDINGS: Cardiovascular: Minimal coronary artery calcification. Global cardiac size within normal limits. No pericardial effusion. Central pulmonary arteries are enlarged in keeping with changes of pulmonary arterial hypertension. Mild atherosclerotic calcification within the thoracic aorta. No aortic aneurysm. Mediastinum/Nodes: There is mediastinal shift to the left secondary to the large right pleural effusion. Visualized thyroid is unremarkable. No pathologic thoracic adenopathy. Esophagus is unremarkable. Lungs/Pleura: Large right pleural effusion is present with complete collapse of the right middle and lower lobes and subtotal collapse of the right upper lobe. As noted above, there is resultant mediastinal shift to the left. Left lung is clear. No pneumothorax. No pleural effusion on the left. No central obstructing lesion. Upper Abdomen: At least moderate ascites noted within the perisplenic and perihepatic regions. Musculoskeletal: Osseous structures are age-appropriate. No acute bone abnormality. No lytic or blastic bone lesion. IMPRESSION: 1. Large right pleural effusion demonstrating mass effect with complete collapse of the right middle and lower lobes and subtotal collapse of the right upper lobe.  Resultant mediastinal shift to the left. 2. Minimal coronary artery calcification. 3. Morphologic changes in keeping with pulmonary arterial hypertension.  4. At least moderate ascites within the visualized upper abdomen. Aortic Atherosclerosis (ICD10-I70.0). Electronically Signed   By: Fidela Salisbury M.D.   On: 01/20/2022 18:43   [place] ASCITES (ABDOMEN LIMITED)  Result Date: 01/20/2022 CLINICAL DATA:  Ascites EXAM: LIMITED ABDOMEN ULTRASOUND FOR ASCITES TECHNIQUE: Limited ultrasound survey for ascites was performed in all four abdominal quadrants. COMPARISON:  01/20/2021 FINDINGS: Moderate volume ascites within the abdomen, largest pocket seen within the right upper quadrant. IMPRESSION: Moderate volume ascites. Electronically Signed   By: Davina Poke D.O.   On: 01/20/2022 16:19   DG Chest Port 1 View  Result Date: 01/20/2022 CLINICAL DATA:  Acute exacerbation of congestive heart failure. EXAM: PORTABLE CHEST 1 VIEW COMPARISON:  None Available. FINDINGS: Heart is enlarged. Lung volumes are low. Asymmetric right pleural effusion and airspace disease is present. Mild pulmonary vascular congestion is present on the left. No significant left-sided edema or effusion is present. Degenerative changes are present at both shoulders. IMPRESSION: 1. Asymmetric right pleural effusion and airspace disease is concerning for infection or aspiration. 2. Cardiomegaly and mild pulmonary vascular congestion. Given the asymmetry, this does not appear to be congestive heart failure. Electronically Signed   By: San Morelle M.D.   On: 01/20/2022 15:54     LOS: 1 day   Antonieta Pert, MD [facility]  01/21/2022, 11:31 AM

## 2022-01-21 NOTE — Progress Notes (Signed)
Palliative:  Consult received, chart reviewed extensively. Went to see patient however having procedure at bedside also with multiple visitors - inappropriate for [place] discussion at this time. Will follow up later today/tomorrow for [place] discussion.  Juel Burrow, DNP, AGNP-C Palliative Medicine Team Team Phone # [phone]  Pager # [phone]  NO CHARGE

## 2022-01-21 NOTE — Consult Note (Addendum)
Referring Provider: Adrian Prows, MD Primary Care Physician:  Christain Sacramento, MD Primary Gastroenterologist:  Dr. Fuller Plan  Reason for Consultation:  Cirrhosis   HPI: Dale [name] is a 71 y.o. male the past medical history of arthritis, hypertension, hyperlipidemia, atrial fibrillation, chronic diastolic CHF, OSA on CPAP, diabetes mellitus type 2, chronic anemia (baseline hemoglobin 10), CKD stage IIIb, sleep apnea, hepatic steatosis, thrombocytopenia, gallstones and colon polyps. A consult was requested for further evaluation regarding cirrhosis.  He was admitted to the hospital in [place] with community-acquired pneumonia 12/29/2021.  A CT of the chest done during this hospitalization identified a very large right pleural effusion with pneumonia and evidence of cirrhosis with portal hypertension.  He underwent a thoracentesis and 10 L of fluid was aspirated from the right pleural effusion.   He returned to [facility] 01/09/2022 and he was seen by his cardiologist due to having worsening dyspnea and lower extremity edema. Lasix was switched to torsemide 11/28 Last dose of doxycycline was 11/22.  Wife Dale [name] is in the room provides some of the history as well. His wife Dale [name] states he had similar episode September a year ago in [place] was given Lasix and had been following with Dr. Nicolette Bang and nephrology since that time. Status post right inguinal hernia repair in May.  Per wife patient began to have decreased energy and then dyspnea on exertion in May.  With hospitalization at [facility] 12/30/2021. After thoracentesis in the hospital continue to have worsening abdominal swelling, leg swelling and DOE. Lasix and torsemide not helping. Patient denies ever having had abdominal swelling previous to this. The patient denies issues with jaundice, scleral icterus, dark urine, clay colored stool.  Denies generalized pruritus. Denies confusion.   Has had chills for a year but  denies fever. Patient denies reflux, nausea vomiting.  Dysphagia.  Has bowel movement daily, last bowel movement this morning and loose denies hematochezia.  No melena.  Denies any drinking history.  Did have remote history of tobacco use in the 80s, chewed tobacco up until 17-18 years ago.   No history of IV drug use, no history of blood transfusions, tattoos. Patient has been diabetic for 10+ years, has triglycerides on fenofibrate. States his mother and maternal aunt both have fatty liver, but no history of liver disease or cirrhosis.  01/20/2022 moderate volume ascites within the abdomen largest pocket right upper quadrant. 01/20/2022 status post right thoracentesis 2 L hazy yellow fluid pending albumin, protein, Gram stain, body fluid cell count, ALT LDH, cytology. Troponin 55 now 46. Pending sputum Gram stain and culture, strep pneumonia urine antigen, Legionella.  BNP 130.  IMAGE STUDIES:  Complete ultrasound of the abdomen 12/30/2021: Small volume ascites. The liver measures 15.3 cm. There is irregularity along the liver margins along with coarse heterogeneous echotexture all consistent with cirrhosis. There is no solid mass. Doppler evaluation shows normal directional flow in the portal vein, hepatic artery, hepatic veins. The inferior vena cava is not seen. There is no ductal dilatation with common bile duct measuring 4 mm.  The gallbladder contains multiple small stones. The wall measures 7.3 mm however this could be due to the adjacent ascites. There is no reported tenderness.  The right kidney measures 10.5 x 5.4 x 5.0 cm and the left 10.8 x 6.8 x 6.3 cm. No stones, obstruction or masses. Doppler evaluation shows flow within the kidneys.  The spleen is grossly enlarged measuring 17.0 x 7.5 x 17.1 cm. No mass.  CT chest without contrast 12/30/2021: Large right pleural effusion with adjacent opacities that may all  reflect pneumonia and/or atelectasis. Central obstructing mass not  excluded, follow-up to radiographic resolution recommended following treatment. Contrast enhanced CT, bronchoscopy, or pleural fluid sampling could also be considered.   CARDIAC STUDIES: Echocardiogram 01/03/2022: Normal LV size, mild LVH, normal wall motion.  EF 60 to 65%. Normal RV size and function. Mild mitral and calcification.  Tricuspid valve is normal.  No pulm hypertension.  PAST GI PROCEDURES:  Colonoscopy by Dr. Fuller Plan 01/08/2011: 4-5 mm 2 polyps in the ascending colon 7 mm sessile polyp in the mid transverse colon 5 MM sessile polyp in the descending colon Hemorrhoids Recall colonoscopy 12/12/2015 was never completed.  Past Medical History:  Diagnosis Date   Arthritis    CHF (congestive heart failure) (HCC)    Chronic kidney disease    Diabetes mellitus without complication ([place])    Dysrhythmia    A. Fib   Epigastric hernia    Gout    Hyperlipidemia    Hypertension    Sleep apnea    Tubular adenoma of colon 12/2010    Past Surgical History:  Procedure Laterality Date   CATARACT EXTRACTION W/ INTRAOCULAR LENS IMPLANT     right   HIP SURGERY     INGUINAL HERNIA REPAIR  02/22/2009   left   INGUINAL HERNIA REPAIR Right 06/24/2021   Procedure: RIGHT INGUINAL HERNIA REPAIR;  Surgeon: Donnie Mesa, MD;  Location: [place];  Service: General;  Laterality: Right;  LMA AND TAP BLOCK   INSERTION OF MESH Right 06/24/2021   Procedure: INSERTION OF MESH;  Surgeon: Donnie Mesa, MD;  Location: [place];  Service: General;  Laterality: Right;   KNEE ARTHROSCOPY  02/23/2008   left    Prior to Admission medications   Medication Sig Start Date End Date Taking? Authorizing Provider  atorvastatin (LIPITOR) 80 MG tablet Take 80 mg by mouth daily. 10/12/18  Yes [provider]  cholecalciferol (VITAMIN D3) 25 MCG (1000 UNIT) tablet Take 1,000 Units by mouth daily.   Yes [provider]  diltiazem (CARDIZEM CD) 120 MG 24 hr capsule Take 120 mg by mouth daily.  01/08/22 02/07/22 Yes [provider]  fenofibrate (TRICOR) 145 MG tablet Take 145 mg by mouth daily.   Yes [provider]  glipiZIDE (GLUCOTROL XL) 5 MG 24 hr tablet Take 5 mg by mouth daily with breakfast.   Yes [provider]  torsemide (DEMADEX) 20 MG tablet Take 1 tablet (20 mg total) by mouth 2 (two) times daily. Patient taking differently: Take 20 mg by mouth in the morning and at bedtime. 01/19/22 02/18/22 Yes Adrian Prows, MD  ACCU-CHEK GUIDE test strip  05/16/19   [provider]  Lancets Glory Rosebush ULTRASOFT) lancets Test blood sugar twice a day and as needed 10/01/16   [provider]    Current Facility-Administered Medications  Medication Dose Route Frequency Provider Last Rate Last Admin   acetaminophen (TYLENOL) tablet 650 mg  650 mg Oral Q6H PRN Kc, Ramesh, MD       Or   acetaminophen (TYLENOL) suppository 650 mg  650 mg Rectal Q6H PRN Kc, Ramesh, MD       atorvastatin (LIPITOR) tablet 80 mg  80 mg Oral Daily Kc, Ramesh, MD   80 mg at 01/21/22 0941   azithromycin (ZITHROMAX) tablet 500 mg  500 mg Oral Daily Kc, Maren Beach, MD   500 mg at 01/21/22 0941   cefTRIAXone (  ROCEPHIN) 2 g in sodium chloride 0.9 % 100 mL IVPB  2 g Intravenous Q24H Antonieta Pert, MD   Stopped at 01/20/22 1950   cholecalciferol (VITAMIN D3) 25 MCG (1000 UNIT) tablet 1,000 Units  1,000 Units Oral Daily Kc, Ramesh, MD   1,000 Units at 01/21/22 0944   diltiazem (CARDIZEM CD) 24 hr capsule 120 mg  120 mg Oral Daily Kc, Ramesh, MD   120 mg at 01/21/22 0941   fenofibrate tablet 160 mg  160 mg Oral Daily Kc, Maren Beach, MD   160 mg at 01/21/22 0940   furosemide (LASIX) injection 40 mg  40 mg Intravenous Daily Kc, Maren Beach, MD   40 mg at 01/20/22 1832   insulin aspart (novoLOG) injection 0-9 Units  0-9 Units Subcutaneous TID WC Kc, Maren Beach, MD   2 Units at 01/20/22 1826   ondansetron (ZOFRAN) tablet 4 mg  4 mg Oral Q6H PRN Kc, Maren Beach, MD       Or   ondansetron (ZOFRAN) injection 4 mg   4 mg Intravenous Q6H PRN Kc, Ramesh, MD       sodium chloride flush (NS) 0.9 % injection 3 mL  3 mL Intravenous Q12H Kc, Maren Beach, MD   3 mL at 01/21/22 0944    Allergies as of 01/19/2022 - Review Complete 01/19/2022  Allergen Reaction Noted   Allopurinol Other (See Comments) 12/19/2014    Family History  Problem Relation Age of Onset   Diabetes Mother    Lung cancer Father     Social History   Socioeconomic History   Marital status: Married    Spouse name: Not on file   Number of children: 2   Years of education: Not on file   Highest education level: Not on file  Occupational History   Not on file  Tobacco Use   Smoking status: Former    Packs/day: 1.50    Years: 10.00    Total pack years: 15.00    Types: Cigars, Cigarettes    Quit date: 2000    Years since quitting: 23.9   Smokeless tobacco: Former    Types: Chew    Quit date: 2000   Tobacco comments:    quite 1979 cigarettes  Vaping Use   Vaping Use: Never used  Substance and Sexual Activity   Alcohol use: No   Drug use: No   Sexual activity: Not on file  Other Topics Concern   Not on file  Social History Narrative   Not on file   Social Determinants of Health   Financial Resource Strain: Not on file  Food Insecurity: No Food Insecurity (01/20/2022)   Hunger Vital Sign    Worried About Running Out of Food in the Last Year: Never true    Ran Out of Food in the Last Year: Never true  Transportation Needs: No Transportation Needs (01/20/2022)   PRAPARE - Hydrologist (Medical): No    Lack of Transportation (Non-Medical): No  Physical Activity: Not on file  Stress: Not on file  Social Connections: Not on file  Intimate Partner Violence: Not At Risk (01/20/2022)   Humiliation, Afraid, Rape, and Kick questionnaire    Fear of Current or Ex-Partner: No    Emotionally Abused: No    Physically Abused: No    Sexually Abused: No    Review of Systems: Gen: Denies fever, sweats  or chills. No weight loss.  CV: Denies chest pain, palpitations or edema. Resp: Denies cough, shortness of  breath of hemoptysis.  GI: Denies heartburn, dysphagia, stomach or lower abdominal pain. No diarrhea or constipation. No rectal bleeding or melena.   GU : Denies urinary burning, blood in urine, increased urinary frequency or incontinence. MS: Denies joint pain, muscles aches or weakness. Derm: Denies rash, itchiness, skin lesions or unhealing ulcers. Psych: Denies depression, anxiety, memory loss, suicidal ideation or confusion. Heme: Denies easy bruising, bleeding. Neuro:  Denies headaches, dizziness or paresthesias. Endo:  Denies any problems with DM, thyroid or adrenal function.  Physical Exam: Vital signs in last 24 hours: Temp:  [97.3 F (36.3 C)-98 F (36.7 C)] 98 F (36.7 C) (11/30 0019) Pulse Rate:  [89-96] 90 (11/30 0019) Resp:  [18] 18 (11/30 0019) BP: (117-138)/(74-82) 118/78 (11/30 0941) SpO2:  [93 %-97 %] 95 % (11/30 0821) Weight:  [100.9 kg] 100.9 kg (11/29 1510)   General: Obese, chronically ill-appearing male in no acute distress. Head:  Normocephalic and atraumatic. Eyes:  No scleral icterus. Conjunctiva pink. Ears:  Normal auditory acuity. Nose:  No deformity, discharge or lesions. Mouth:  Dentition intact. No ulcers or lesions.  Neck:  Supple. No lymphadenopathy or thyromegaly.  Lungs: Clear anteriorly, decreased breath sounds right lower base.  Mild rhonchi. Heart: Regular rate and rhythm Abdomen: Obese, distended, fluid wave, shifting dullness, slight right lower abdominal tenderness, no rebound.  Will scratch test mild hepatomegaly. Rectal: Deferred. Musculoskeletal:  Symmetrical without gross deformities.  Pulses:  Normal pulses noted. Extremities: 2-3+ edema, feet warm Neurologic:  Alert and  oriented x 4. No focal deficits.  No clonus, asterixis. Skin:  Intact without significant lesions or rashes. Psych:  Alert and cooperative. Normal mood and  affect.  Intake/Output from previous day: 11/29 0701 - 11/30 0700 In: 102.7 [IV Piggyback:102.7] Out: -  Intake/Output this shift: No intake/output data recorded.  Lab Results: Recent Labs    01/20/22 1512  WBC 12.7*  HGB 13.8  HCT 39.6  PLT 89*   BMET Recent Labs    01/20/22 1512  NA 133*  K 3.7  CL 94*  CO2 19*  GLUCOSE 198*  BUN 69*  CREATININE 2.50*  CALCIUM 9.4   LFT Recent Labs    01/20/22 1512  PROT 7.3  ALBUMIN 2.7*  AST 110*  ALT 79*  ALKPHOS 117  BILITOT 1.8*   PT/INR No results for input(s): "LABPROT", "INR" in the last 72 hours. Hepatitis Panel No results for input(s): "HEPBSAG", "HCVAB", "HEPAIGM", "HEPBIGM" in the last 72 hours.  MELD 3.0: 27 at 01/21/2022 11:34 AM MELD-Na: 26 at 01/21/2022 11:34 AM Calculated from: Serum Creatinine: 2.48 mg/dL at 01/21/2022 11:34 AM Serum Sodium: 132 mmol/L at 01/21/2022 11:34 AM Total Bilirubin: 1.3 mg/dL at 01/21/2022 11:34 AM Serum Albumin: 2.2 g/dL at 01/21/2022 11:34 AM INR(ratio): 1.8 at 01/21/2022 11:34 AM Age at listing (hypothetical): 78 years Sex: Male at 01/21/2022 11:34 AM    Studies/Results: DG Chest 1 View  Result Date: 01/21/2022 CLINICAL DATA:  Status post right-sided thoracentesis EXAM: CHEST  1 VIEW COMPARISON:  01/20/2022 chest CT.  Plain film 01/20/2022 FINDINGS: Midline trachea. Normal heart size. Moderate right pleural effusion is relatively similar to the prior plain film. No pneumothorax. No left-sided pleural effusion. No congestive failure. Right lower lung airspace disease is not significantly changed. IMPRESSION: No pneumothorax after thoracentesis. Similar moderate right-sided pleural effusion with adjacent airspace disease. Electronically Signed   By: Abigail Miyamoto M.D.   On: 01/21/2022 08:50   CT CHEST WO CONTRAST  Result Date: 01/20/2022 CLINICAL DATA:  Shortness of breath (Ped 0-17y) EXAM: CT CHEST WITHOUT CONTRAST TECHNIQUE: Multidetector CT imaging of the chest was  performed following the standard protocol without IV contrast. RADIATION DOSE REDUCTION: This exam was performed according to the departmental dose-optimization program which includes automated exposure control, adjustment of the mA and/or kV according to patient size and/or use of iterative reconstruction technique. COMPARISON:  None Available. FINDINGS: Cardiovascular: Minimal coronary artery calcification. Global cardiac size within normal limits. No pericardial effusion. Central pulmonary arteries are enlarged in keeping with changes of pulmonary arterial hypertension. Mild atherosclerotic calcification within the thoracic aorta. No aortic aneurysm. Mediastinum/Nodes: There is mediastinal shift to the left secondary to the large right pleural effusion. Visualized thyroid is unremarkable. No pathologic thoracic adenopathy. Esophagus is unremarkable. Lungs/Pleura: Large right pleural effusion is present with complete collapse of the right middle and lower lobes and subtotal collapse of the right upper lobe. As noted above, there is resultant mediastinal shift to the left. Left lung is clear. No pneumothorax. No pleural effusion on the left. No central obstructing lesion. Upper Abdomen: At least moderate ascites noted within the perisplenic and perihepatic regions. Musculoskeletal: Osseous structures are age-appropriate. No acute bone abnormality. No lytic or blastic bone lesion. IMPRESSION: 1. Large right pleural effusion demonstrating mass effect with complete collapse of the right middle and lower lobes and subtotal collapse of the right upper lobe. Resultant mediastinal shift to the left. 2. Minimal coronary artery calcification. 3. Morphologic changes in keeping with pulmonary arterial hypertension. 4. At least moderate ascites within the visualized upper abdomen. Aortic Atherosclerosis (ICD10-I70.0). Electronically Signed   By: Fidela Salisbury M.D.   On: 01/20/2022 18:43   [place] ASCITES (ABDOMEN  LIMITED)  Result Date: 01/20/2022 CLINICAL DATA:  Ascites EXAM: LIMITED ABDOMEN ULTRASOUND FOR ASCITES TECHNIQUE: Limited ultrasound survey for ascites was performed in all four abdominal quadrants. COMPARISON:  01/20/2021 FINDINGS: Moderate volume ascites within the abdomen, largest pocket seen within the right upper quadrant. IMPRESSION: Moderate volume ascites. Electronically Signed   By: Davina Poke D.O.   On: 01/20/2022 16:19   DG Chest Port 1 View  Result Date: 01/20/2022 CLINICAL DATA:  Acute exacerbation of congestive heart failure. EXAM: PORTABLE CHEST 1 VIEW COMPARISON:  None Available. FINDINGS: Heart is enlarged. Lung volumes are low. Asymmetric right pleural effusion and airspace disease is present. Mild pulmonary vascular congestion is present on the left. No significant left-sided edema or effusion is present. Degenerative changes are present at both shoulders. IMPRESSION: 1. Asymmetric right pleural effusion and airspace disease is concerning for infection or aspiration. 2. Cardiomegaly and mild pulmonary vascular congestion. Given the asymmetry, this does not appear to be congestive heart failure. Electronically Signed   By: San Morelle M.D.   On: 01/20/2022 15:54    IMPRESSION/PLAN:  71 year-old male with compensated cirrhosis with ascites and recurrent right pleural effusion.  Decompensated cirrhosis ? From heart failure, fatty liver? CT 2012 showed diffuse hepatic steatosis, history of diabetes, elevated triglycerides AST 110 ALT 79  Alkphos 117 TBili 1.8 Will get INR to calculate MELD - Serial INR, CBC, CMET daily. - Daily MELD -Most likely this is fatty liver with metabolic dysfunction but will get hepatitis panel, AMA, ANA, IGG, ASMA,  -At some point patient may benefit from screening endoscopy, no signs of GI bleed at this time.  HGB stable.  Ascites Ascites on exam.  -Will schedule for diagnostic paracentesis.  Getting lasix 40 mg IV, last dose 11/29,  continue to monitor renal function. -With renal  function improvement can consider adding spironolactone and Lasix. - due to CKD, do not remove more than 2-3 L   -Please send for cell count with differential, albumin concentration, total protein concentration.  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 Pending pleural fluid studies  Thrombocytopenia secondary to above Platelets 89  Leukocytosis, afebrile, hemodynamically stable 01/20/2022 WBC 12.7  On azithromycin rocephin Large right pleural effusion recent pneumonia treated at [facility] 11/07 Pending sputum Gram stain and culture, strep pneumonia urine antigen, Legionella.   Acute on chronic renal failure BUN 69 Cr 2.50  GFR 27  Potassium 3.7  Magnesium 2.1  -Avoid large volume paracentesis -Avoid nephrotoxic drugs - monitor closely -Consider nephrology consult -Consider urine sodium  History of colon polyps Last colonoscopy 2012 with Dr. Fuller Plan.  Recall 2017 however this was never completed. No signs of active GI bleed at this time. Consider colonoscopy and endoscopy outpatient.  Acute on chronic diastolic heart failure 35/32/9924 echocardiogram ejection fraction 60 to 65% mild mitral calcification, tricuspid valve normal no pulmonary hypertension, mild LVH, normal wall motion.  Atrial fibrillation.  CHA2DS2-VASc Score is 4.  Not on anticoagulation.  Diabetes mellitus type 2  Vladimir Crofts, PA-C 12:44 PM  --------------------------------------------------------------------------------------------------------  I have taken a history, reviewed the chart and examined the patient. I performed a substantive portion of this encounter, including complete performance of at least one of  the key components, in conjunction with the APP. I agree with the APP's note, impression and recommendations  71 year old male with history of DM, HTN, HLP, obesity and CKD admitted with worsening pleural effusion in the setting of newly diagnosed cirrhosis and new ascites.  As discussed above, patient was admitted earlier this month in [facility] with large pleural effusion and found to have cirrhotic appearing liver, was also treated for pneumonia.. Since discharge, he has been taking lasix but his dyspnea has not improved and he denied having much of a diuretic response.  He was switched to torsemide by his cardiologist a few days before admission, but still did not respond well. On admission he had a very large  right sided pleural effusion and moderate ascites.  Subsequently had a 2L thoracentesis with significant improvement in his dyspnea.  He denies ever being told he had liver disease prior to his recent hospitalization.  He has no history of heavy alcohol use.   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  His pleural effusion was considerable especially since he was already on diuretics and following a low sodium diet.  He has a significant renal impairment which may be limit our ability to be aggressive with his diuretics  I encouraged him to hydrate with water and to be very vigilant about salt intake.  Will plan restart lasix and aldactone soon if his Scr improves.  We discussed other modalities for hepatic hydrothorax management to include TIPS and pleurodesis.  Although his MELD is high, his tbili is normal, and he may otherwise be a good TIPS candidate if we are unable to control his effusion with diuretics/diet.  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 Candis Schatz, MD [facility]

## 2022-01-22 ENCOUNTER — Encounter (HOSPITAL_COMMUNITY)
Admission: AD | Disposition: A | Discharge status: Home Health | Payer: Self-pay | Source: Ambulatory Visit | Attending: Internal Medicine

## 2022-01-22 ENCOUNTER — Inpatient Hospital Stay (HOSPITAL_COMMUNITY): Payer: Medicare Other | Admitting: Anesthesiology

## 2022-01-22 ENCOUNTER — Encounter (HOSPITAL_COMMUNITY): Payer: Self-pay | Admitting: Internal Medicine

## 2022-01-22 DIAGNOSIS — I8510 Secondary esophageal varices without bleeding: Secondary | ICD-10-CM

## 2022-01-22 DIAGNOSIS — K222 Esophageal obstruction: Secondary | ICD-10-CM

## 2022-01-22 DIAGNOSIS — K766 Portal hypertension: Secondary | ICD-10-CM

## 2022-01-22 DIAGNOSIS — K7460 Unspecified cirrhosis of liver: Secondary | ICD-10-CM

## 2022-01-22 DIAGNOSIS — K2980 Duodenitis without bleeding: Secondary | ICD-10-CM

## 2022-01-22 DIAGNOSIS — K3189 Other diseases of stomach and duodenum: Secondary | ICD-10-CM

## 2022-01-22 DIAGNOSIS — M1990 Unspecified osteoarthritis, unspecified site: Secondary | ICD-10-CM

## 2022-01-22 DIAGNOSIS — I5033 Acute on chronic diastolic (congestive) heart failure: Secondary | ICD-10-CM | POA: Diagnosis not present

## 2022-01-22 DIAGNOSIS — J948 Other specified pleural conditions: Secondary | ICD-10-CM | POA: Diagnosis not present

## 2022-01-22 HISTORY — PX: ESOPHAGOGASTRODUODENOSCOPY (EGD) WITH PROPOFOL: SHX5813

## 2022-01-22 HISTORY — PX: BIOPSY: SHX5522

## 2022-01-22 LAB — GLUCOSE, CAPILLARY
Glucose-Capillary: 152 mg/dL — ABNORMAL HIGH (ref 70–99)
Glucose-Capillary: 113 mg/dL — ABNORMAL HIGH (ref 70–99)
Glucose-Capillary: 126 mg/dL — ABNORMAL HIGH (ref 70–99)
Glucose-Capillary: 160 mg/dL — ABNORMAL HIGH (ref 70–99)
Glucose-Capillary: 133 mg/dL — ABNORMAL HIGH (ref 70–99)

## 2022-01-22 LAB — BASIC METABOLIC PANEL
Anion gap: 8 (ref 5–15)
BUN: 75 mg/dL — ABNORMAL HIGH (ref 8–23)
CO2: 25 mmol/L (ref 22–32)
Calcium: 9 mg/dL (ref 8.9–10.3)
Chloride: 101 mmol/L (ref 98–111)
Creatinine, Ser: 2.47 mg/dL — ABNORMAL HIGH (ref 0.61–1.24)
GFR, Estimated: 27 mL/min — ABNORMAL LOW (ref >60)
Glucose, Bld: 142 mg/dL — ABNORMAL HIGH (ref 70–99)
Potassium: 3.7 mmol/L (ref 3.5–5.1)
Sodium: 134 mmol/L — ABNORMAL LOW (ref 135–145)

## 2022-01-22 LAB — CBC
HCT: 33.8 % — ABNORMAL LOW (ref 39.0–52.0)
Hemoglobin: 11.7 g/dL — ABNORMAL LOW (ref 13.0–17.0)
MCH: 33.5 pg (ref 26.0–34.0)
MCHC: 34.6 g/dL (ref 30.0–36.0)
MCV: 96.8 fL (ref 80.0–100.0)
Platelets: 43 10*3/uL — ABNORMAL LOW (ref 150–400)
RBC: 3.49 MIL/uL — ABNORMAL LOW (ref 4.22–5.81)
RDW: 14.1 % (ref 11.5–15.5)
WBC: 6.4 10*3/uL (ref 4.0–10.5)
nRBC: 0 % (ref 0.0–0.2)

## 2022-01-22 LAB — PROCALCITONIN: Procalcitonin: 0.41 ng/mL

## 2022-01-22 LAB — URINALYSIS, COMPLETE (UACMP) WITH MICROSCOPIC
Bacteria, UA: NONE SEEN
Bilirubin Urine: NEGATIVE
Glucose, UA: NEGATIVE mg/dL
Hgb urine dipstick: NEGATIVE
Ketones, ur: NEGATIVE mg/dL
Leukocytes,Ua: NEGATIVE
Nitrite: NEGATIVE
Protein, ur: NEGATIVE mg/dL
Specific Gravity, Urine: 1.014 (ref 1.005–1.030)
pH: 5 (ref 5.0–8.0)

## 2022-01-22 LAB — ANA W/REFLEX IF POSITIVE: Anti Nuclear Antibody (ANA): NEGATIVE

## 2022-01-22 LAB — PROTIME-INR
INR: 1.8 — ABNORMAL HIGH (ref 0.8–1.2)
Prothrombin Time: 20.7 seconds — ABNORMAL HIGH (ref 11.4–15.2)

## 2022-01-22 SURGERY — ESOPHAGOGASTRODUODENOSCOPY (EGD) WITH PROPOFOL
Anesthesia: Monitor Anesthesia Care

## 2022-01-22 MED ORDER — ALBUMIN HUMAN 25 % IV SOLN
50.0000 g | Freq: Every day | INTRAVENOUS | Status: DC
  Filled 2022-01-22: qty 200

## 2022-01-22 MED ORDER — FUROSEMIDE 10 MG/ML IJ SOLN
80.0000 mg | Freq: Once | INTRAMUSCULAR | Status: AC
  Administered 2022-01-22: 80 mg via INTRAVENOUS
  Filled 2022-01-22: qty 8

## 2022-01-22 MED ORDER — LIDOCAINE 2% (20 MG/ML) 5 ML SYRINGE
INTRAMUSCULAR | Status: DC | PRN
  Administered 2022-01-22: 60 mg via INTRAVENOUS

## 2022-01-22 MED ORDER — PROPOFOL 10 MG/ML IV BOLUS
INTRAVENOUS | Status: DC | PRN
  Administered 2022-01-22: 20 mg via INTRAVENOUS

## 2022-01-22 MED ORDER — METOLAZONE 5 MG PO TABS
5.0000 mg | ORAL_TABLET | Freq: Every day | ORAL | Status: DC
  Administered 2022-01-22: 5 mg via ORAL
  Filled 2022-01-22: qty 1

## 2022-01-22 MED ORDER — ALBUMIN HUMAN 25 % IV SOLN
50.0000 g | Freq: Every day | INTRAVENOUS | Status: AC
  Administered 2022-01-22 – 2022-01-23 (×2): 50 g via INTRAVENOUS
  Filled 2022-01-22 (×2): qty 200

## 2022-01-22 MED ORDER — FUROSEMIDE 10 MG/ML IJ SOLN
80.0000 mg | Freq: Two times a day (BID) | INTRAMUSCULAR | Status: AC
  Administered 2022-01-23 – 2022-01-24 (×4): 80 mg via INTRAVENOUS
  Filled 2022-01-22 (×4): qty 8

## 2022-01-22 MED ORDER — PROPOFOL 500 MG/50ML IV EMUL
INTRAVENOUS | Status: DC | PRN
  Administered 2022-01-22: 125 ug/kg/min via INTRAVENOUS

## 2022-01-22 MED ORDER — SODIUM CHLORIDE 0.9 % IV SOLN: INTRAVENOUS | Status: DC | PRN

## 2022-01-22 MED ORDER — PHENYLEPHRINE 80 MCG/ML (10ML) SYRINGE FOR IV PUSH (FOR BLOOD PRESSURE SUPPORT)
PREFILLED_SYRINGE | INTRAVENOUS | Status: DC | PRN
  Administered 2022-01-22 (×4): 80 ug via INTRAVENOUS

## 2022-01-22 SURGICAL SUPPLY — 15 items

## 2022-01-22 NOTE — Consult Note (Signed)
[facility] Renal Consultation Note  Requesting MD: Antonieta Pert, MD Indication for Consultation:  AKI and diuretic management   Chief complaint: shortness of breath  HPI:  Dale [name] is a 71 y.o. male with a history of chronic diastolic CHF, chronic kidney disease stage 3b, diabetes mellitus, atrial fibrillation, gout, hypertension, and sleep apnea who presented to the hospital with shortness of breath.  I called his wife on speakerphone at his bedside and she supplemented his history.  He has had swelling and shortness of breath recently and [place] to be able to return home.  Here he was found to have a pleural effusion.  He had a thoracentesis with 2 liters removed.  Note that he has a new diagnosis of cirrhosis.  His team has also ordered a paracentesis.  Note that his Lasix was changed to torsemide recently.  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  GI, pulmonary, nephrology, and palliative care have been consulted.  He had 1.6 liters UOP over 11/30.  Note that he follows at [facility] with me; I last saw him 09/21/21 for follow-up.  I have included his additional lab trends as below.  He doesn't have his hearing aids or phone with him.  We discussed the importance of a low salt diet and he states that he follows this.    08/04/21 - Cr 1.54, eGFR 48, K 3.9, bicarb 21, Hb 11.1, platelets 76 - PCP (PCP assessed as stable to prior)  02/24/21 - Cr 1.68, glu 261, BUN 30, eGFR 43, K 3.7, bicarb 21, Hb 10.2, iron 142, 47%, PTH 63, vit D 9; up/cr ratio 65 mg/g  01/12/21 - Cr 1.78, BUN 29, eGFR 41, K 4.9, bicarb 16; UA neg protein, 0-2 RBC ; up/cr ratio 55 mg/g  10/31/20 - Cr 2.37, BUN 44, bicarb 18, eGFR 29 (now noted on epic search)  10/28/2020 ER visit - Cr 2.96, BUN 52, K 5.0, bicarb 17, Hb 9.9, eGFR 21 - he gives me a printout of labs that  I copied and returned to him  08/20/20 - A1c 5.8, Cr 1.98, eGFR 36, BUN 39, K 4.7, bicarb 17  03/18/20 - Cr 2.06, BUN 39, K 4.1, eGFR 34, Hb 11.9; A1c 7.7  08/2019 - Cr 2.18, eGFR 30  03/2019 - Cr 1.86, urine alb/g cr 12 mg/g  09/2018 - Cr 1.93  07/13/2010 - Cr 2.30 per an old cardiology note   Creatinine, Ser  Date/Time Value Ref Range Status  01/22/2022 06:06 AM 2.47 (H) 0.61 - 1.24 mg/dL Final  01/21/2022 11:34 AM 2.48 (H) 0.61 - 1.24 mg/dL Final  01/20/2022 03:12 PM 2.50 (H) 0.61 - 1.24 mg/dL Final  06/15/2021 08:23 AM 1.61 (H) 0.61 - 1.24 mg/dL Final  10/31/2020 12:28 PM 2.37 (H) 0.76 - 1.27 mg/dL Final  08/20/2020 11:11 AM 1.98 (H) 0.76 - 1.27 mg/dL Final  07/13/2010 10:36 PM 2.30 (H) 0.4 - 1.5 mg/dL Final    PMHx:   Past Medical History:  Diagnosis Date   Arthritis    CHF (congestive heart failure) (HCC)    Chronic kidney disease    Diabetes mellitus without complication ([place])    Dysrhythmia    A. Fib   Epigastric hernia    Gout    Hyperlipidemia    Hypertension    Sleep apnea    Tubular adenoma  of colon 12/2010    Past Surgical History:  Procedure Laterality Date   CATARACT EXTRACTION W/ INTRAOCULAR LENS IMPLANT     right   HIP SURGERY     INGUINAL HERNIA REPAIR  02/22/2009   left   INGUINAL HERNIA REPAIR Right 06/24/2021   Procedure: RIGHT INGUINAL HERNIA REPAIR;  Surgeon: Donnie Mesa, MD;  Location: [place];  Service: General;  Laterality: Right;  LMA AND TAP BLOCK   INSERTION OF MESH Right 06/24/2021   Procedure: INSERTION OF MESH;  Surgeon: Donnie Mesa, MD;  Location: [place];  Service: General;  Laterality: Right;   IR THORACENTESIS ASP PLEURAL SPACE W/IMG GUIDE  01/21/2022   KNEE ARTHROSCOPY  02/23/2008   left    Family Hx:  Family History  Problem Relation Age of Onset   Diabetes Mother    Lung cancer Father     Social History:  reports that he quit smoking about 23 years ago. His smoking use included cigars and cigarettes. He has a 15.00  pack-year smoking history. He quit smokeless tobacco use about 23 years ago.  His smokeless tobacco use included chew. He reports that he does not drink alcohol and does not use drugs.  Allergies:  Allergies  Allergen Reactions   Nsaids Other (See Comments)    NOT to take these due to kidney function   Allopurinol Other (See Comments)    Foot pain, not a rash., 19 Mar 2010    Medications: Prior to Admission medications   Medication Sig Start Date End Date Taking? Authorizing Provider  atorvastatin (LIPITOR) 80 MG tablet Take 80 mg by mouth daily. 10/12/18  Yes [provider]  cholecalciferol (VITAMIN D3) 25 MCG (1000 UNIT) tablet Take 1,000 Units by mouth daily.   Yes [provider]  diltiazem (CARDIZEM CD) 120 MG 24 hr capsule Take 120 mg by mouth daily. 01/08/22 02/07/22 Yes [provider]  fenofibrate (TRICOR) 145 MG tablet Take 145 mg by mouth daily.   Yes [provider]  glipiZIDE (GLUCOTROL XL) 5 MG 24 hr tablet Take 5 mg by mouth daily with breakfast.   Yes [provider]  torsemide (DEMADEX) 20 MG tablet Take 1 tablet (20 mg total) by mouth 2 (two) times daily. Patient taking differently: Take 20 mg by mouth in the morning and at bedtime. 01/19/22 02/18/22 Yes Adrian Prows, MD  ACCU-CHEK GUIDE test strip  05/16/19   [provider]  Lancets Glory Rosebush ULTRASOFT) lancets Test blood sugar twice a day and as needed 10/01/16   [provider]   I have reviewed the patient's current and reported prior to admission medications.  Labs:     Latest Ref Rng & Units 01/22/2022    6:06 AM 01/21/2022   11:34 AM 01/20/2022    3:12 PM  BMP  Glucose 70 - 99 mg/dL 142  188  198   BUN 8 - 23 mg/dL 75  74  69   Creatinine 0.61 - 1.24 mg/dL 2.47  2.48  2.50   Sodium 135 - 145 mmol/L 134  132  133   Potassium 3.5 - 5.1 mmol/L 3.7  3.7  3.7   Chloride 98 - 111 mmol/L 101  100  94   CO2 22 - 32 mmol/L _0 Calcium 8.9 -  10.3 mg/dL 9.0  8.5  9.4     Urinalysis    Component Value Date/Time   COLORURINE YELLOW 01/22/2022 [place]  01/22/2022 1850   LABSPEC 1.014 01/22/2022 1850   PHURINE 5.0 01/22/2022 1850   GLUCOSEU NEGATIVE 01/22/2022 1850   HGBUR NEGATIVE 01/22/2022 1850   BILIRUBINUR NEGATIVE 01/22/2022 1850   KETONESUR NEGATIVE 01/22/2022 1850   PROTEINUR NEGATIVE 01/22/2022 1850   UROBILINOGEN 1.0 07/13/2010 1819   NITRITE NEGATIVE 01/22/2022 1850   LEUKOCYTESUR NEGATIVE 01/22/2022 1850     ROS:  Pertinent items noted in HPI and remainder of comprehensive ROS otherwise negative.  Physical Exam: Vitals:   01/22/22 1030 01/22/22 1919  BP: 131/75 132/71  Pulse: 94 (!) 105  Resp: 16   Temp: 97.6 F (36.4 C) 97.6 F (36.4 C)  SpO2: 94%      General:  adult male in bed in NAD  HEENT: NCAT Eyes: sclera anicteric Neck: supple trachea midline  Heart: S1S2 no rub Lungs: decreased breath sounds anteriorly right lung; increased work of breathing with exertion; left lung clearer Abdomen: softly distended/nontender; ascites Extremities: 2-3+ edema lower extremities Skin: no rash on extremities exposed; no cyanosis or clubbing   Neuro: reduced hearing; alert and oriented x3; provides hx and follows commands Psych normal mood and affect GU external catheter in place   Assessment/Plan:  # AKI  - pre-renal insults with large volume paracenteses and new diagnosis of cirrhosis.  Slightly over his baseline but very overloaded.  Anticipate will be difficult to diurese as patient with hypoalbuminemia    - Stop daily metolazone - optimize volume status as below - he is getting albumin  - can assess a bladder scan but would be more accurate after his planned paracentesis - reassess tomorrow  - Strict ins/outs and daily weights   # Acute on Chronic diastolic CHF  - Increase lasix to 80 mg IV BID first dose now - has an external catheter  - Stop daily metolazone - note that he  has been transitioned to torsemide - agree with the change and once transitioned to oral meds we will need to assess dosing.  Anticipate need for metolazone PRN as well   # Cirrhosis - with ascites - new diagnosis  - team has ordered a paracentesis  - Discussed salt and fluid restriction   # CKD stage 3b - Follows at [facility] with me  - Baseline Cr trends as above.  Cr 1.6 - 2.0   # HTN  - Acceptable control   Thank you for the consult.  Please do not hesitate to contact me with any questions    Claudia Desanctis 01/22/2022, 9:06 PM

## 2022-01-22 NOTE — Op Note (Signed)
[facility] Patient Name: Dale [name] Procedure Date : 01/22/2022 MRN: [id number] Attending MD: Gladstone Pih. Candis Schatz , MD, [id number] Date of Birth: Feb 15, 1951 CSN: [id number] Age: 71 Admit Type: Inpatient Procedure:                Upper GI endoscopy Indications:              Cirrhosis rule out esophageal varices Providers:                Nicki Reaper E. Candis Schatz, MD, Vista Lawman, RN, Gloris Ham, Technician Referring MD:             Adrian Prows Medicines:                Monitored Anesthesia Care Complications:            No immediate complications. Estimated Blood Loss:     Estimated blood loss was minimal. Procedure:                Pre-Anesthesia Assessment:                           - Prior to the procedure, a History and Physical                            was performed, and patient medications and                            allergies were reviewed. The patient's tolerance of                            previous anesthesia was also reviewed. The risks                            and benefits of the procedure and the sedation                            options and risks were discussed with the patient.                            All questions were answered, and informed consent                            was obtained. Prior Anticoagulants: The patient has                            taken no anticoagulant or antiplatelet agents. ASA                            Grade Assessment: III - A patient with severe                            systemic disease. After reviewing the risks and  benefits, the patient was deemed in satisfactory                            condition to undergo the procedure.                           After obtaining informed consent, the endoscope was                            passed under direct vision. Throughout the                            procedure, the patient's blood pressure, pulse, and                             oxygen saturations were monitored continuously. The                            [id number]-H190 ([id number]) Olympus endoscope was introduced                            through the mouth, and advanced to the third part                            of duodenum. The upper GI endoscopy was                            accomplished without difficulty. The patient                            tolerated the procedure well. Scope In: Scope Out: Findings:      The examined portions of the nasopharynx, oropharynx and larynx were       normal.      A single small variceal column was found in the lower third of the       esophagus. No stigmata      There was resistance to passage of the endoscope at the GEJ and the GEJ       did not insufflate well.      Mild portal hypertensive gastropathy was found in the gastric fundus and       in the gastric body. Biopsies were taken with a cold forceps for       histology. Estimated blood loss was minimal.      Localized mild mucosal changes characterized by erythema and granularity       were found in the gastric antrum. Biopsies were taken with a cold       forceps for Helicobacter pylori testing. Estimated blood loss was       minimal.      Diffuse moderate inflammation characterized by erosions, friability and       nodularity was found in the duodenal bulb. Biopsies were taken with a       cold forceps for histology. Estimated blood loss was minimal.      The exam of the duodenum was otherwise normal. Impression:               - The examined portions  of the nasopharynx,                            oropharynx and larynx were normal.                           - Single small column of esophageal varices. No                            indication for banding or NSBB                           - Hypertensive GEJ. No apparent stricture, doubt                            extrinsic compression. Can consider barium swallow                            or  esophageal manometry as an outpatient                           - Portal hypertensive gastropathy. Biopsied.                           - Erythematous and granular mucosa in the antrum.                            Biopsied.                           - Duodenitis. Biopsied. Moderate Sedation:      N/A Recommendation:           - Return patient to hospital ward for ongoing care.                           - Low sodium diet.                           - Continue present medications.                           - Await pathology results.                           - No need for variceal bleeding prophylaxis Procedure Code(s):        --- Professional ---                           [phone], Esophagogastroduodenoscopy, flexible,                            transoral; with biopsy, single or multiple Diagnosis Code(s):        --- Professional ---                           K74.60, Unspecified cirrhosis of liver  I85.10, Secondary esophageal varices without                            bleeding                           K22.2, Esophageal obstruction                           K76.6, Portal hypertension                           K31.89, Other diseases of stomach and duodenum                           K29.80, Duodenitis without bleeding CPT copyright 2022 American Medical Association. All rights reserved. The codes documented in this report are preliminary and upon coder review may  be revised to meet current compliance requirements. [name], MD 01/22/2022 9:45:45 AM This report has been signed electronically. Number of Addenda: 0

## 2022-01-22 NOTE — Anesthesia Postprocedure Evaluation (Signed)
Anesthesia Post Note  Patient: Dale [name]  Procedure(s) Performed: ESOPHAGOGASTRODUODENOSCOPY (EGD) WITH PROPOFOL BIOPSY     Patient location during evaluation: PACU Anesthesia Type: MAC Level of consciousness: awake and alert and oriented Pain management: pain level controlled Vital Signs Assessment: post-procedure vital signs reviewed and stable Respiratory status: spontaneous breathing, nonlabored ventilation and respiratory function stable Cardiovascular status: stable and blood pressure returned to baseline Postop Assessment: no apparent nausea or vomiting Anesthetic complications: no   No notable events documented.  Last Vitals:  Vitals:   01/22/22 0933 01/22/22 0945  BP: (!) 103/59 113/71  Pulse: (!) 102 98  Resp: 16 18  Temp: 36.4 C   SpO2: 96% 95%    Last Pain:  Vitals:   01/22/22 0933  TempSrc:   PainSc: 0-No pain                 Telesforo Brosnahan A.

## 2022-01-22 NOTE — Care Management Important Message (Signed)
Important Message  Patient Details  Name: Dale [name] MRN: [id number] Date of Birth: 1950/04/03   Medicare Important Message Given:  Yes     Shelda Altes 01/22/2022, 8:27 AM

## 2022-01-22 NOTE — Anesthesia Preprocedure Evaluation (Addendum)
Anesthesia Evaluation  Patient identified by MRN, date of birth, ID band Patient awake    Reviewed: Allergy & Precautions, NPO status , Patient's Chart, lab work & pertinent test results  Airway Mallampati: III  TM Distance: >3 FB Neck ROM: Full    Dental no notable dental hx. (+) Teeth Intact   Pulmonary sleep apnea and Continuous Positive Airway Pressure Ventilation , Patient abstained from smoking., former smoker   Pulmonary exam normal breath sounds clear to auscultation       Cardiovascular hypertension, Pt. on medications +CHF  + dysrhythmias Atrial Fibrillation  Rhythm:Irregular Rate:Tachycardia  ECHO: 1. Left ventricle cavity is normal in size and wall thickness. Normal global wall motion. Normal LV systolic function with EF 59%. Normal diastolic filling pattern. 2. Left atrial cavity is normal in size. 3. Right atrial cavity is normal in size. 4. Right ventricle cavity is normal in size. Normal right ventricular function. 5. Structurally normal trileaflet aortic valve. No evidence of aortic stenosis. Mild (Grade I) aortic regurgitation. 6. Structurally normal mitral valve. No evidence of mitral stenosis. Mild (Grade I) mitral regurgitation. 7. Structurally normal tricuspid valve with no regurgitation. No evidence of tricuspid valve stenosis. 8. Structurally normal pulmonic valve with no regurgitation. No evidence of pulmonic valve stenosis. 9. No evidence of significant pericardial effusion. 10. The aortic root is normal. 11. Normal pulmonary artery. 12. IVC is normal with respiratory variation. The IVC is adequately visualized. Normal right atrial pressure.   Neuro/Psych negative neurological ROS  negative psych ROS   GI/Hepatic negative GI ROS,,,(+) Cirrhosis   ascites    Screening for varices   Endo/Other  diabetes, Oral Hypoglycemic Agents    Renal/GU CRFRenal disease     Musculoskeletal  (+)  Arthritis ,    Abdominal  (+) + obese  Peds  Hematology  (+) Blood dyscrasia, anemia Thrombocytopenia    Anesthesia Other Findings variceal screening in cirrhosis  Reproductive/Obstetrics                             Anesthesia Physical Anesthesia Plan  ASA: 3  Anesthesia Plan: MAC   Post-op Pain Management: Minimal or no pain anticipated   Induction: Intravenous  PONV Risk Score and Plan: 1 and Treatment may vary due to age or medical condition and Propofol infusion  Airway Management Planned: Natural Airway and Nasal Cannula  Additional Equipment: None  Intra-op Plan:   Post-operative Plan:   Informed Consent: I have reviewed the patients History and Physical, chart, labs and discussed the procedure including the risks, benefits and alternatives for the proposed anesthesia with the patient or authorized representative who has indicated his/her understanding and acceptance.       Plan Discussed with: Anesthesiologist and CRNA  Anesthesia Plan Comments: (Echocardiogram 11/04/2020: Left ventricle cavity is normal in size and wall thickness. Normal global wall motion. Normal LV systolic function with EF 59%. Normal diastolic filling pattern. Mild (Grade I) aortic regurgitation. Mild (Grade I) mitral regurgitation. Previous study on 08/15/2020 reported EF 45-50%, mod MR, mild TR.   PCV MYOCARDIAL PERFUSION WITH LEXISCAN 11/03/2020 Lexiscan nuclear stress test performed using 1-day protocol. Small area of mildly decreased tracer uptake in apical inferior myocardium, likely due to gut attenuation. All segments of left ventricle demonstrated normal wall motion and thickening. Stress LVEF 81%. Low risk study. )        Anesthesia Quick Evaluation

## 2022-01-22 NOTE — Progress Notes (Signed)
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 Irven Shelling office as a direct admission for worsening shortness of breath and leg edema and fluid overload. Patient was seen at Dr. Irven Shelling office 10/28 with increasing leg swelling, abdominal swelling for 3-4 days, he was nontoxic afebrile with nonlabored breathing, saturating 98% on room air and admission was requested as a direct admit. He was on lasix 40 mg bid and changed torsemide 20 mg bid 11/28 without improvement in leg swelling. He endorses orthopnea DOE. Patient otherwise denies any nausea, vomiting,  fever, chills, headache, focal weakness, numbness tingling, speech difficulties , or abdomen pain. EKG Dr. Irven Shelling office A-fib with a controlled ventricular response rate at 93, LAD left anterior fascicular block, poor R wave progression.    Subjective: Seen and examined. He just came back from endoscopy. Overnight BP 120s to 130s afebrile Labs this morning show creatinine stable 2.4 Pro-Cal 0.4 hemoglobin stable platelets ~43 K coagulopathy INR 1.8 Family at the bedside. Assessment and Plan:  Acute on chronic diastolic MOQ:HUTMLYY having worsening shortness of breath orthopnea and lower leg edema, failed outpatient Lasix and torsemide.  Has bump  in creatinine from baseline.  Dr. Einar Gip patient patient today, continue IV Lasix will likely need to increase dose- will ask nephro to see 2/2 AKI/CKD.Monitor intake output Daily weightNet IO Since Admission: -808.33 mL [01/22/22 1124]  Filed Weights   01/22/22 0018 01/22/22 0436 01/22/22 0831  Weight: 102 kg 98.9 kg 98.9 kg     Possible pneumonia-given leukocytosis and elevated procalcitonin Large Rt pleural effusion with complete collapse of the RML,ubtotal collapse of  RUL w/ mediastinal shift to the left: Appreciate pulmonary input.s/p ultrasound-guided thoracentesis  11/30; w/ 2l hazy yellow fluid removed:  cytology and culture pending, Gram stain no organism.  LDH 30 total protein< 3, albumin <1, serum albumin 2.2 . 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  Continue empiric ceftriaxone/azithromycin, IV Lasix, IV albumin- will get nephrology involved-discussed with Dr. Royce Macadamia, is followed by Dr. Royce Macadamia as outpatient as well. Unclear how much it will improve of note he had chest tube placed at [facility] 2 weeks ago.  GI has been consulted.  Discussed with pulmonary palliative care also consulted  AKI on Stage 3a chronic kidney disease (CKD): Baseline creatinine was 1.6- 1.8 recently.  Creatinine remains elevated consulted nephrology for diuretic management in this current setting  Recent Labs  Lab 01/20/22 1512 01/21/22 1134 01/22/22 0606  BUN 69* 74* 75*  CREATININE 2.50* 2.48* 2.47*    Decompensated liver cirrhosis with moderate volume ascites: Abdominal paracentesis 12/1 follow-up fluid analysis appreciate GI input/work-up on board.-likely from fatty liver, rule out other etiology.Of note his CT scan from 2012 showed severe diffuse hepatic steatosis.  GI work-up appreciated EGD done today  Coagulopathy due to decompensated liver cirrhosis: Monitor INR Recent Labs  Lab 01/21/22  1134 01/22/22 0606  INR 1.8* 1.8*     Chronic anemia/Thrombocytopenia:  Noted drop in platelet count in 40s baseline 60s to 80s.  Monitor  Recent Labs  Lab 01/20/22 1512 01/21/22 1134 01/22/22 0606  PLT 89* 48* 43*   Mildly elevated troponin no significant chest pain check EKG, troponin flat/decreasing likely demand ischemia in the setting of patient's congestive heart failure   Chronic atrial fibrillation: Rate controlled on Cardizem not on anticoagulation > Dr. Einar Gip has been advised to start heparin but 2/2 drop in platelet counts holding heparin for now.  Diabetes mellitus type 2, controlled, on sliding scale insulin.  Continue to hold glipizide Recent Labs  Lab 01/20/22 1503 01/20/22 1646 01/21/22 1108 01/21/22 1607 01/21/22 2029 01/22/22 0524 01/22/22 0939  GLUCAP  --    < > 171* 167* 177* 152* 113*  HGBA1C 6.4*  --   --   --   --   --   --    < > = values in this interval not displayed.    Essential hypertension: Stable, continue Cardizem.  Hyperlipidemia: Continue Lipitor Obstructive sleep apnea: cont CPAP bedtime Class 1 obesity Body mass index is 31.91 kg/m.Will benefit with PCP follow-up, weight loss  healthy lifestyle and cont cpap Pressure injury left buttock POA Pressure Injury 01/20/22 Buttocks Left Stage 2 -  Partial thickness loss of dermis presenting as a shallow open injury with a red, pink wound bed without slough. round 1 cm left buttock (Active)  01/20/22 1550  Location: Buttocks  Location Orientation: Left  Staging: Stage 2 -  Partial thickness loss of dermis presenting as a shallow open injury with a red, pink wound bed without slough.  Wound Description (Comments): round 1 cm left buttock  Present on Admission:   Dressing Type Foam - Lift dressing to assess site every shift 01/20/22 1927    DVT prophylaxis: SCDs Start: 01/20/22 1524 holding off on anticoagulation due to thrombocytopenia Code Status:   Code Status: Full Code Family Communication: plan of care discussed with patient at bedside. Patient status is:  Inpatient because of acute on chronic CHF pleural effusion cirrhosis ascites  Level of care: Telemetry Cardiac  Dispo: The patient is from: home            Anticipated disposition: TBD Objective: Vitals last 24 hrs: Vitals:   01/22/22 0945 01/22/22 1000 01/22/22 1015 01/22/22 1030  BP: 113/71 124/71 121/68 131/75  Pulse: 98 97 95 94  Resp: 18 19 16 16   Temp:    97.6 F (36.4 C)  TempSrc:      SpO2: 95% 95% 95% 94%  Weight:      Height:       Weight change: 1.12 kg  Physical Examination: General exam: AAox3, weak,older appearing HEENT:Oral mucosa moist, Ear/Nose WNL grossly, dentition normal. Respiratory system: bilaterally diminished BS at the bases,no use of accessory muscle Cardiovascular system: S1 & S2 +, regular rate, JVD neg. Gastrointestinal system: Abdomen soft, NT,ND,BS+ Nervous System:Alert, awake, moving extremities and grossly nonfocal Extremities: LE ankle edema +, lower extremities warm Skin: No rashes,no icterus. MSK: Normal muscle bulk,tone, power   Medications reviewed:  Scheduled Meds:  atorvastatin  80 mg Oral Daily   azithromycin  500 mg Oral Daily   cholecalciferol  1,000 Units Oral Daily   diltiazem  120 mg Oral Daily   fenofibrate  160 mg Oral Daily   furosemide  40 mg Intravenous Daily   insulin aspart  0-9 Units Subcutaneous TID WC  sodium chloride flush  3 mL Intravenous Q12H   Continuous Infusions:  albumin human     cefTRIAXone (ROCEPHIN)  IV 2 g (01/21/22 1900)   Diet Order             Diet 2 gram sodium Room service appropriate? Yes; Fluid consistency: Thin; Fluid restriction: 1500 mL Fluid  Diet effective now                  Intake/Output Summary (Last 24 hours) at 01/22/2022 1124 Last data filed at 01/22/2022 1030 Gross per 24 hour  Intake 500 ml  Output 1651 ml  Net -1151 ml   Net IO Since Admission: -808.33 mL [01/22/22 1124]  Wt Readings from Last 3 Encounters:  01/22/22 98.9 kg  01/19/22 102.9 kg  11/19/21 101.2  kg     Unresulted Labs (From admission, onward)     Start     Ordered   01/22/22 9381  Basic metabolic panel  Daily at 5am,   R     Question:  Specimen collection method  Answer:  IV Team=IV Team collect   01/21/22 0747   01/22/22 0500  CBC  Daily at 5am,   R     Question:  Specimen collection method  Answer:  IV Team=IV Team collect   01/21/22 0747   01/21/22 1733  Sodium, urine, random  Once,   R        01/21/22 1732   01/21/22 1156  AFP tumor marker  Once,   R       Question:  Specimen collection method  Answer:  IV Team=IV Team collect   01/21/22 1200   01/21/22 1156  IgG  Once,   R       Question:  Specimen collection method  Answer:  IV Team=IV Team collect   01/21/22 1200   01/21/22 1156  ANA w/Reflex if Positive  Once,   R       Question:  Specimen collection method  Answer:  IV Team=IV Team collect   01/21/22 1200   01/21/22 1156  Mitochondrial antibodies  Once,   R       Question:  Specimen collection method  Answer:  IV Team=IV Team collect   01/21/22 1200   01/21/22 1156  Anti-smooth muscle antibody, IgG  Once,   R       Question:  Specimen collection method  Answer:  IV Team=IV Team collect   01/21/22 1200   01/21/22 1056  Protime-INR  Daily at 5am,   R     Question:  Specimen collection method  Answer:  IV Team=IV Team collect   01/21/22 1055   01/20/22 1656  Legionella Pneumophila Serogp 1 Ur Ag  Once,   R        01/20/22 1656   01/20/22 1656  Strep pneumoniae urinary antigen  Once,   R        01/20/22 1656   01/20/22 1656  Expectorated Sputum Assessment w Gram Stain, Rflx to Resp Cult  Once,   R        01/20/22 1656          Data Reviewed: I have personally reviewed following labs and imaging studies CBC: Recent Labs  Lab 01/20/22 1512 01/21/22 1134 01/22/22 0606  WBC 12.7* 8.5 6.4  HGB 13.8 12.3* 11.7*  HCT 39.6 34.7* 33.8*  MCV 95.7 96.1 96.8  PLT 89* 48* 43*   Basic Metabolic Panel: Recent Labs  Lab 01/20/22 1512  01/21/22 1134  01/22/22 0606  NA 133* 132* 134*  K 3.7 3.7 3.7  CL 94* 100 101  CO2 19* 20* 25  GLUCOSE 198* 188* 142*  BUN 69* 74* 75*  CREATININE 2.50* 2.48* 2.47*  CALCIUM 9.4 8.5* 9.0  MG 2.1  --   --    GFR: Estimated Creatinine Clearance: 32.4 mL/min (A) (by C-G formula based on SCr of 2.47 mg/dL (H)). Liver Function Tests: Recent Labs  Lab 01/20/22 1512 01/21/22 1134  AST 110* 98*  ALT 79* 71*  ALKPHOS 117 98  BILITOT 1.8* 1.3*  PROT 7.3 6.5  ALBUMIN 2.7* 2.2*  HbA1C: Recent Labs    01/20/22 1503  HGBA1C 6.4*   Recent Results (from the past 240 hour(s))  Gram stain     Status: None   Collection Time: 01/21/22  8:36 AM   Specimen: Lung, Right; Pleural Fluid  Result Value Ref Range Status   Specimen Description PLEURAL  Final   Special Requests NONE  Final   Gram Stain   Final    FEW WBC SEEN NO ORGANISMS SEEN Performed at [facility], [place] [address], [place], [place] [address]    Report Status 01/21/2022 FINAL  Final  Culture, body fluid w Gram Stain-bottle     Status: None (Preliminary result)   Collection Time: 01/21/22  8:36 AM   Specimen: Pleura  Result Value Ref Range Status   Specimen Description PLEURAL  Final   Special Requests NONE  Final   Culture   Final    NO GROWTH < 24 HOURS Performed at [facility], [place] [address], [place], [place] [address]    Report Status PENDING  Incomplete    Antimicrobials: Anti-infectives (From admission, onward)    Start     Dose/Rate Route Frequency Ordered Stop   01/20/22 1745  cefTRIAXone (ROCEPHIN) 2 g in sodium chloride 0.9 % 100 mL IVPB        2 g 200 mL/hr over 30 Minutes Intravenous Every 24 hours 01/20/22 1656 01/25/22 1744   01/20/22 1745  azithromycin (ZITHROMAX) tablet 500 mg        500 mg Oral Daily 01/20/22 1656 01/25/22 0959      Culture/Microbiology    Component Value Date/Time   SDES PLEURAL 01/21/2022 0836   SDES PLEURAL 01/21/2022 0836   SPECREQUEST NONE 01/21/2022 0836    SPECREQUEST NONE 01/21/2022 0836   CULT  01/21/2022 0836    NO GROWTH < 24 HOURS Performed at [facility], [place] [address], [place], [place] [address]    REPTSTATUS 01/21/2022 FINAL 01/21/2022 0836   REPTSTATUS PENDING 01/21/2022 0836    Other culture-see note  Radiology Studies: IR THORACENTESIS ASP PLEURAL SPACE W/IMG GUIDE  Result Date: 01/21/2022 INDICATION: Patient with history of CHF presents with shortness of breath, previous CT showed large right pleural effusion. Request for therapeutic and diagnostic thoracentesis. EXAM: ULTRASOUND GUIDED RIGHT THORACENTESIS MEDICATIONS: 5 mL 1% lidocaine COMPLICATIONS: None immediate. PROCEDURE: An ultrasound guided thoracentesis was thoroughly discussed with the patient and questions answered. The benefits, risks, alternatives and complications were also discussed. The patient understands and wishes to proceed with the procedure. Written consent was obtained. Ultrasound was performed to localize and mark an adequate pocket of fluid in the right chest. The area was then prepped and draped in the normal sterile fashion. 1% Lidocaine was used for local anesthesia. Under ultrasound guidance a 6 Fr Safe-T-Centesis catheter was introduced. Thoracentesis was performed. The catheter  was removed and a dressing applied. FINDINGS: A total of approximately 2 L of hazy yellow fluid was removed. Samples were sent to the laboratory as requested by the clinical team. Post procedure chest X-ray reviewed, negative for pneumothorax. IMPRESSION: Successful ultrasound guided right thoracentesis yielding 2 L of pleural fluid. Read by: Durenda Guthrie, PA-C Electronically Signed   By: Jerilynn Mages.  Shick M.D.   On: 01/21/2022 10:51   DG Chest 1 View  Result Date: 01/21/2022 CLINICAL DATA:  Status post right-sided thoracentesis EXAM: CHEST  1 VIEW COMPARISON:  01/20/2022 chest CT.  Plain film 01/20/2022 FINDINGS: Midline trachea. Normal heart size. Moderate right pleural effusion is  relatively similar to the prior plain film. No pneumothorax. No left-sided pleural effusion. No congestive failure. Right lower lung airspace disease is not significantly changed. IMPRESSION: No pneumothorax after thoracentesis. Similar moderate right-sided pleural effusion with adjacent airspace disease. Electronically Signed   By: Abigail Miyamoto M.D.   On: 01/21/2022 08:50   CT CHEST WO CONTRAST  Result Date: 01/20/2022 CLINICAL DATA:  Shortness of breath (Ped 0-17y) EXAM: CT CHEST WITHOUT CONTRAST TECHNIQUE: Multidetector CT imaging of the chest was performed following the standard protocol without IV contrast. RADIATION DOSE REDUCTION: This exam was performed according to the departmental dose-optimization program which includes automated exposure control, adjustment of the mA and/or kV according to patient size and/or use of iterative reconstruction technique. COMPARISON:  None Available. FINDINGS: Cardiovascular: Minimal coronary artery calcification. Global cardiac size within normal limits. No pericardial effusion. Central pulmonary arteries are enlarged in keeping with changes of pulmonary arterial hypertension. Mild atherosclerotic calcification within the thoracic aorta. No aortic aneurysm. Mediastinum/Nodes: There is mediastinal shift to the left secondary to the large right pleural effusion. Visualized thyroid is unremarkable. No pathologic thoracic adenopathy. Esophagus is unremarkable. Lungs/Pleura: Large right pleural effusion is present with complete collapse of the right middle and lower lobes and subtotal collapse of the right upper lobe. As noted above, there is resultant mediastinal shift to the left. Left lung is clear. No pneumothorax. No pleural effusion on the left. No central obstructing lesion. Upper Abdomen: At least moderate ascites noted within the perisplenic and perihepatic regions. Musculoskeletal: Osseous structures are age-appropriate. No acute bone abnormality. No lytic or  blastic bone lesion. IMPRESSION: 1. Large right pleural effusion demonstrating mass effect with complete collapse of the right middle and lower lobes and subtotal collapse of the right upper lobe. Resultant mediastinal shift to the left. 2. Minimal coronary artery calcification. 3. Morphologic changes in keeping with pulmonary arterial hypertension. 4. At least moderate ascites within the visualized upper abdomen. Aortic Atherosclerosis (ICD10-I70.0). Electronically Signed   By: Fidela Salisbury M.D.   On: 01/20/2022 18:43   [place] ASCITES (ABDOMEN LIMITED)  Result Date: 01/20/2022 CLINICAL DATA:  Ascites EXAM: LIMITED ABDOMEN ULTRASOUND FOR ASCITES TECHNIQUE: Limited ultrasound survey for ascites was performed in all four abdominal quadrants. COMPARISON:  01/20/2021 FINDINGS: Moderate volume ascites within the abdomen, largest pocket seen within the right upper quadrant. IMPRESSION: Moderate volume ascites. Electronically Signed   By: Davina Poke D.O.   On: 01/20/2022 16:19   DG Chest Port 1 View  Result Date: 01/20/2022 CLINICAL DATA:  Acute exacerbation of congestive heart failure. EXAM: PORTABLE CHEST 1 VIEW COMPARISON:  None Available. FINDINGS: Heart is enlarged. Lung volumes are low. Asymmetric right pleural effusion and airspace disease is present. Mild pulmonary vascular congestion is present on the left. No significant left-sided edema or effusion is present. Degenerative changes are present at both  shoulders. IMPRESSION: 1. Asymmetric right pleural effusion and airspace disease is concerning for infection or aspiration. 2. Cardiomegaly and mild pulmonary vascular congestion. Given the asymmetry, this does not appear to be congestive heart failure. Electronically Signed   By: San Morelle M.D.   On: 01/20/2022 15:54     LOS: 2 days   Antonieta Pert, MD [facility]  01/22/2022, 11:24 AM

## 2022-01-22 NOTE — Transfer of Care (Signed)
Immediate Anesthesia Transfer of Care Note  Patient: Dale [name]  Procedure(s) Performed: ESOPHAGOGASTRODUODENOSCOPY (EGD) WITH PROPOFOL BIOPSY  Patient Location: PACU  Anesthesia Type:MAC  Level of Consciousness: awake, alert , and oriented  Airway & Oxygen Therapy: Patient Spontanous Breathing and Patient connected to nasal cannula oxygen  Post-op Assessment: Report given to RN and Post -op Vital signs reviewed and stable  Post vital signs: Reviewed and stable  Last Vitals:  Vitals Value Taken Time  BP 103/59 01/22/22 0932  Temp    Pulse 100 01/22/22 0934  Resp 16 01/22/22 0934  SpO2 95 % 01/22/22 0934  Vitals shown include unvalidated device data.  Last Pain:  Vitals:   01/22/22 0831  TempSrc: Temporal  PainSc: 0-No pain         Complications: No notable events documented.

## 2022-01-22 NOTE — Anesthesia Procedure Notes (Signed)
Procedure Name: MAC Date/Time: 01/22/2022 9:08 AM  Performed by: Mariea Clonts, CRNAPre-anesthesia Checklist: Patient identified, Emergency Drugs available, Suction available, Patient being monitored and Timeout performed Patient Re-evaluated:Patient Re-evaluated prior to induction Oxygen Delivery Method: Simple face mask

## 2022-01-22 NOTE — Progress Notes (Addendum)
Progress Note  Primary GI: Dr. Fuller Plan   Subjective  Chief Complaint:Decompensated cirrhosis with ascites, pleural effusion, and HRS.  Patient with EGD today. Denies fever, chills. Continues to have swelling, that is postthoracentesis yesterday. Ending paracentesis today.    Objective   Vital signs in last 24 hours: Temp:  [97.2 F (36.2 C)-98 F (36.7 C)] 97.6 F (36.4 C) (12/01 1030) Pulse Rate:  [76-102] 94 (12/01 1030) Resp:  [15-21] 16 (12/01 1030) BP: (103-133)/(59-85) 131/75 (12/01 1030) SpO2:  [90 %-96 %] 94 % (12/01 1030) Weight:  [98.9 kg-102 kg] 98.9 kg (12/01 0831) Last BM Date : 01/21/22 Last BM recorded by nurses in past 5 days Stool Type: Type 2 (Lump and sauage like) (01/21/2022  5:22 PM)  Patient with EGD today.  Intake/Output from previous day: 11/30 0701 - 12/01 0700 In: 43 [P.O.:340; IV Piggyback:100] Out: 1551 [Urine:1550; Stool:1] Intake/Output this shift: Total I/O In: 300 [I.V.:300] Out: 100 [Urine:100]  Studies/Results: IR THORACENTESIS ASP PLEURAL SPACE W/IMG GUIDE  Result Date: 01/21/2022 INDICATION: Patient with history of CHF presents with shortness of breath, previous CT showed large right pleural effusion. Request for therapeutic and diagnostic thoracentesis. EXAM: ULTRASOUND GUIDED RIGHT THORACENTESIS MEDICATIONS: 5 mL 1% lidocaine COMPLICATIONS: None immediate. PROCEDURE: An ultrasound guided thoracentesis was thoroughly discussed with the patient and questions answered. The benefits, risks, alternatives and complications were also discussed. The patient understands and wishes to proceed with the procedure. Written consent was obtained. Ultrasound was performed to localize and mark an adequate pocket of fluid in the right chest. The area was then prepped and draped in the normal sterile fashion. 1% Lidocaine was used for local anesthesia. Under ultrasound guidance a 6 Fr Safe-T-Centesis catheter was introduced. Thoracentesis was  performed. The catheter was removed and a dressing applied. FINDINGS: A total of approximately 2 L of hazy yellow fluid was removed. Samples were sent to the laboratory as requested by the clinical team. Post procedure chest X-ray reviewed, negative for pneumothorax. IMPRESSION: Successful ultrasound guided right thoracentesis yielding 2 L of pleural fluid. Read by: Durenda Guthrie, PA-C Electronically Signed   By: Jerilynn Mages.  Shick M.D.   On: 01/21/2022 10:51   DG Chest 1 View  Result Date: 01/21/2022 CLINICAL DATA:  Status post right-sided thoracentesis EXAM: CHEST  1 VIEW COMPARISON:  01/20/2022 chest CT.  Plain film 01/20/2022 FINDINGS: Midline trachea. Normal heart size. Moderate right pleural effusion is relatively similar to the prior plain film. No pneumothorax. No left-sided pleural effusion. No congestive failure. Right lower lung airspace disease is not significantly changed. IMPRESSION: No pneumothorax after thoracentesis. Similar moderate right-sided pleural effusion with adjacent airspace disease. Electronically Signed   By: Abigail Miyamoto M.D.   On: 01/21/2022 08:50   CT CHEST WO CONTRAST  Result Date: 01/20/2022 CLINICAL DATA:  Shortness of breath (Ped 0-17y) EXAM: CT CHEST WITHOUT CONTRAST TECHNIQUE: Multidetector CT imaging of the chest was performed following the standard protocol without IV contrast. RADIATION DOSE REDUCTION: This exam was performed according to the departmental dose-optimization program which includes automated exposure control, adjustment of the mA and/or kV according to patient size and/or use of iterative reconstruction technique. COMPARISON:  None Available. FINDINGS: Cardiovascular: Minimal coronary artery calcification. Global cardiac size within normal limits. No pericardial effusion. Central pulmonary arteries are enlarged in keeping with changes of pulmonary arterial hypertension. Mild atherosclerotic calcification within the thoracic aorta. No aortic aneurysm.  Mediastinum/Nodes: There is mediastinal shift to the left secondary to the large right pleural effusion. Visualized  thyroid is unremarkable. No pathologic thoracic adenopathy. Esophagus is unremarkable. Lungs/Pleura: Large right pleural effusion is present with complete collapse of the right middle and lower lobes and subtotal collapse of the right upper lobe. As noted above, there is resultant mediastinal shift to the left. Left lung is clear. No pneumothorax. No pleural effusion on the left. No central obstructing lesion. Upper Abdomen: At least moderate ascites noted within the perisplenic and perihepatic regions. Musculoskeletal: Osseous structures are age-appropriate. No acute bone abnormality. No lytic or blastic bone lesion. IMPRESSION: 1. Large right pleural effusion demonstrating mass effect with complete collapse of the right middle and lower lobes and subtotal collapse of the right upper lobe. Resultant mediastinal shift to the left. 2. Minimal coronary artery calcification. 3. Morphologic changes in keeping with pulmonary arterial hypertension. 4. At least moderate ascites within the visualized upper abdomen. Aortic Atherosclerosis (ICD10-I70.0). Electronically Signed   By: Fidela Salisbury M.D.   On: 01/20/2022 18:43   [place] ASCITES (ABDOMEN LIMITED)  Result Date: 01/20/2022 CLINICAL DATA:  Ascites EXAM: LIMITED ABDOMEN ULTRASOUND FOR ASCITES TECHNIQUE: Limited ultrasound survey for ascites was performed in all four abdominal quadrants. COMPARISON:  01/20/2021 FINDINGS: Moderate volume ascites within the abdomen, largest pocket seen within the right upper quadrant. IMPRESSION: Moderate volume ascites. Electronically Signed   By: Davina Poke D.O.   On: 01/20/2022 16:19   DG Chest Port 1 View  Result Date: 01/20/2022 CLINICAL DATA:  Acute exacerbation of congestive heart failure. EXAM: PORTABLE CHEST 1 VIEW COMPARISON:  None Available. FINDINGS: Heart is enlarged. Lung volumes are low.  Asymmetric right pleural effusion and airspace disease is present. Mild pulmonary vascular congestion is present on the left. No significant left-sided edema or effusion is present. Degenerative changes are present at both shoulders. IMPRESSION: 1. Asymmetric right pleural effusion and airspace disease is concerning for infection or aspiration. 2. Cardiomegaly and mild pulmonary vascular congestion. Given the asymmetry, this does not appear to be congestive heart failure. Electronically Signed   By: San Morelle M.D.   On: 01/20/2022 15:54    Lab Results: Recent Labs    01/20/22 1512 01/21/22 1134 01/22/22 0606  WBC 12.7* 8.5 6.4  HGB 13.8 12.3* 11.7*  HCT 39.6 34.7* 33.8*  PLT 89* 48* 43*   BMET Recent Labs    01/20/22 1512 01/21/22 1134 01/22/22 0606  NA 133* 132* 134*  K 3.7 3.7 3.7  CL 94* 100 101  CO2 19* 20* 25  GLUCOSE 198* 188* 142*  BUN 69* 74* 75*  CREATININE 2.50* 2.48* 2.47*  CALCIUM 9.4 8.5* 9.0   LFT Recent Labs    01/21/22 1134  PROT 6.5  ALBUMIN 2.2*  AST 98*  ALT 71*  ALKPHOS 98  BILITOT 1.3*  BILIDIR 0.5*  IBILI 0.8   PT/INR Recent Labs    01/21/22 1134 01/22/22 0606  LABPROT 20.6* 20.7*  INR 1.8* 1.8*     Scheduled Meds:  atorvastatin  80 mg Oral Daily   azithromycin  500 mg Oral Daily   cholecalciferol  1,000 Units Oral Daily   diltiazem  120 mg Oral Daily   fenofibrate  160 mg Oral Daily   furosemide  40 mg Intravenous Daily   insulin aspart  0-9 Units Subcutaneous TID WC   sodium chloride flush  3 mL Intravenous Q12H   Continuous Infusions:  albumin human     cefTRIAXone (ROCEPHIN)  IV 2 g (01/21/22 1900)      Patient profile:   71 year old male with  diabetes, hypertension, CKD, obesity newly diagnosed cirrhosis with ascites and pleural effusion.   Impression/Plan:   Decompensated cirrhosis ? From heart failure, fatty liver? CT 2012 showed diffuse hepatic steatosis, history of diabetes, elevated triglycerides AST  98 ALT 71  Alkphos 98 TBili 1.3 INR 01/22/2022 1.8  MELD-Na: 25 at 01/22/2022  6:06 AM - Serial INR, CBC, CMET daily. - Daily MELD -Most likely this is fatty liver with metabolic dysfunction. Hep B negative hep A antibody reactive Pending ASMA, AMA.,GGT .   Ascites Pending paracentesis today  -Please send for cell count with differential, albumin concentration, total protein concentration. - due to CKD, do not remove more than 2-3 L  -With renal function improvement can consider adding spironolactone and Lasix.  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    Acute on chronic renal failure BUN 75 ( 65) Cr 2.47 (2.5) GFR 27  Potassium 3.7  Magnesium 2.1  -Avoid large volume paracentesis -Avoid nephrotoxic drugs - monitor closely -pending urine sodium - getting albumin 138m a day for second day with minimal improvement of his kidney function.  -Nephrology consult   Esophageal variceal screening 01/22/2022 EGD small: Varices no indication for banding or NSVT, hypertensive GEJ consider barium swallow outpatient.  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 SFuller Plan  Recall 2017 however this was never completed. No signs of active GI bleed at this time. Consider colonoscopy outpatient.   Acute on chronic diastolic heart failure 130/07/6226echocardiogram ejection  fraction 60 to 65% mild mitral calcification, tricuspid valve normal no pulmonary hypertension, mild LVH, normal wall motion.   Atrial fibrillation.  CHA2DS2-VASc Score is 4.  Not on anticoagulation.  Principal Problem:   Acute on chronic diastolic CHF (congestive heart failure) (HCC) Active Problems:   Atrial fibrillation ([place]   Diabetes mellitus type 2, controlled ([place]   Essential hypertension   Hyperlipidemia   Obstructive sleep apnea (adult) (pediatric)   Macrocytic anemia   Stage 3a chronic kidney disease (CKD) (HCC)   Cirrhosis of liver with ascites (HCC)   Pleural effusion   Acute renal failure superimposed on stage 3b chronic kidney disease ([place]   Pressure injury of skin    LOS: 2 days   AVladimir Crofts 01/22/2022, 11:08 AM  ----------------------------------------------------------------------------------------------------------------------  I have taken a history, reviewed the chart and examined the patient. I performed a substantive portion of this encounter, including complete performance of at least one of the key components, in conjunction with the APP. I agree with the APP's note, impression and recommendations  71year old male with history of DM, HTN, HLP, obesity and CKD admitted with worsening pleural effusion in the setting of newly diagnosed cirrhosis and new ascites and acute on chronic kidney injury.  Presentation most consistent with NKarlene Linemancirrhosis with ascites/hepatic hydrothorax. The lack of improvement in renal function despite volume resuscitation with albumin is concerning for possible HRS EGD today showed very small varices, no therapy recommended.  Mild portal hypertensive gastropathy and likely peptic  duodenitis also seen.  Would not recommend PPI therapy given renal insufficiency and absence of upper GI bleeding/ulceration.    Recommend engaging nephrology and consider initiation of octreotide and midodrine if nephrology feels presentation  consistent with hepatorenal syndrome. I would hold off on diuretics until improvement in renal function. Continue low-sodium diet  Dr. Benson Norway will be covering [facility] this weekend  Kaicee Scarpino E. Candis Schatz, MD [facility]

## 2022-01-22 NOTE — Progress Notes (Signed)
Palliative:  Chart reviewed. Met with patient and wife at bedside. Introduced role of palliative medicine. Patient eating lunch and declined conversation at that time. Returned a few hours later and patient awaiting paracentesis - unclear when will happen.  We discuss plan for him to follow up with palliative provider over the weekend.  Juel Burrow, DNP, AGNP-C Palliative Medicine Team Team Phone # [phone]  Pager # [phone]  NO CHARGE

## 2022-01-22 NOTE — Progress Notes (Signed)
This chaplain responded to RN consult for creating/updating the Pt. Advance Directive on Friday morning when family is present.  The chaplain understands the Pt. is in a procedure this morning. The Pt. wife-[name] is waiting in the Pt. room. The chaplain began rapport building and AD education with Dale [name]. The chaplain left the Pt. a blank AD with Dale [name].   Dale [name] is accepting of F/U on Monday with the opportunity for the Pt. to participate in AD education and ask clarifying questions.  The chaplain understands Dale [name] feels supported by her community and two daughters.  Chaplain Sallyanne Kuster [phone]

## 2022-01-22 NOTE — Progress Notes (Signed)
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  He returns to [facility] and after 4 days his shortness of breath increased along with orthopnea lower extremity edema he was admitted for further evaluation and treatment noted to have a new right pleural effusion.  Thoracentesis performed 01/21/2022 with 2 L of fluid obtained with a total of 12 L over the last month of pleural fluid drainage.  He is notable for liver dysfunction along with ascites on films and distinct fluid wave on evaluation.  Pulmonary critical care asked to evaluate. Echocardiogram 01/03/2022: Normal LV size, mild LVH, normal wall motion.  EF 60 to 65%. Normal RV size and function. Mild mitral and calcification.  Tricuspid valve is normal.  No pulm hypertension. Pertinent  Medical History   Past Medical History:  Diagnosis Date   Arthritis    CHF (congestive heart failure) ([place])    Chronic kidney disease    Diabetes mellitus without complication ([place])    Dysrhythmia    A. Fib   Epigastric hernia    Gout    Hyperlipidemia    Hypertension    Sleep apnea    Tubular adenoma of colon 12/2010     Significant Hospital Events: Including procedures, antibiotic start and stop dates in addition to other pertinent events   01/21/2022 thoracentesis right for 2 L EGD per GI 01/22/2022  Interim History / Subjective:  Notes diuresis from increased Lasix  Objective   Blood pressure 126/81, pulse 90, temperature 98 F (36.7 C), temperature  source Oral, resp. rate 18, height 5' 10"  (1.778 m), weight 100.9 kg, SpO2 95 %.        Intake/Output Summary (Last 24 hours) at 01/21/2022 0943 Last data filed at 01/21/2022 4825 Gross per 24 hour  Intake 102.67 ml  Output --  Net 102.67 ml   Filed Weights   01/20/22 1510  Weight: 100.9 kg    Examination: General: Obese male in no acute distress HEENT: MM pink/moist no JVD or lymphadenopathy Neuro: Grossly intact without focal defect CV: Heart sounds are regular PULM: Diminished in the bases, 98% on room air   GI: soft, bsx4 active, fluid wave GU: Voids Extremities: warm/dry, 2+ edema  Skin: no rashes or lesions   Resolved Hospital Problem list     Assessment & Plan:  Recurrent right pleural effusion status post 10 L removal 12/29/2021 per pigtail chest tube and 2 L removed today 01/21/2022 in the setting of congestive heart failure, ascites with cirrhosis.  Atrial fibrillation Albumin noted to be 2.3 currently receiving albumin For EGD per GI today Monitor hepatic profile Aggressive diuresis Very little for pulmonary add at this time will be available as needed     Hyponatremia Recent Labs  Lab 01/20/22 1512  NA 133*   Monitor   Obstructive sleep apnea CPAP compliant Continue CPAP  Chronic kidney disease 14 Lab Results  Component Value Date   CREATININE 2.50 (H) 01/20/2022   CREATININE 1.61 (H) 06/15/2021   CREATININE 2.37 (H) 10/31/2020  Per primary  Diabetes mellitus Per primary  Chronic anemia Per primary  Best Practice (right click and "Reselect all SmartList Selections" daily)   Diet/type: Regular consistency (see orders) DVT prophylaxis: not indicated GI prophylaxis: PPI Lines: N/A Foley:  N/A Code Status:  full code Last date of multidisciplinary goals of care discussion [tbd]  Labs   CBC: Recent Labs  Lab 01/20/22 1512  WBC 12.7*  HGB 13.8  HCT 39.6  MCV 95.7  PLT 89*    Basic Metabolic Panel: Recent Labs  Lab  01/20/22 1512  NA 133*  K 3.7  CL 94*  CO2 19*  GLUCOSE 198*  BUN 69*  CREATININE 2.50*  CALCIUM 9.4  MG 2.1   GFR: Estimated Creatinine Clearance: 32.3 mL/min (A) (by C-G formula based on SCr of 2.5 mg/dL (H)). Recent Labs  Lab 01/20/22 1512 01/21/22 0115  PROCALCITON 0.54 0.51  WBC 12.7*  --     Liver Function Tests: Recent Labs  Lab 01/20/22 1512  AST 110*  ALT 79*  ALKPHOS 117  BILITOT 1.8*  PROT 7.3  ALBUMIN 2.7*   No results for input(s): "LIPASE", "AMYLASE" in the last 168 hours. No results for input(s): "AMMONIA" in the last 168 hours.  ABG    Component Value Date/Time   TCO2 24 07/13/2010 2236     Coagulation Profile: No results for input(s): "INR", "PROTIME" in the last 168 hours.  Cardiac Enzymes: No results for input(s): "CKTOTAL", "CKMB", "CKMBINDEX", "TROPONINI" in the last 168 hours.  HbA1C: Hgb A1c MFr Bld  Date/Time Value Ref Range Status  01/20/2022 03:03 PM 6.4 (H) 4.8 - 5.6 % Final    Comment:    (NOTE)         Prediabetes: 5.7 - 6.4         Diabetes: >6.4         Glycemic control for adults with diabetes: <7.0   06/15/2021 08:23 AM 6.4 (H) 4.8 - 5.6 % Final    Comment:    (NOTE) Pre diabetes:          5.7%-6.4%  Diabetes:              >6.4%  Glycemic control for   <7.0% adults with diabetes     CBG: Recent Labs  Lab 01/20/22 1646 01/20/22 2113 01/21/22 0627  GLUCAP 190* 166* 152*      Critical care time: Ferol Luz Rosemaria Inabinet ACNP Acute Care Nurse Practitioner [place] Please consult [place] 01/21/2022, 9:43 AM

## 2022-01-22 NOTE — Consult Note (Incomplete)
CARDIOLOGY CONSULT NOTE  Patient ID: Dale [name] MRN: [id number] DOB/AGE: October 07, 1950 71 y.o.  Admit date: 01/20/2022 Referring Physician  Antonieta Pert, MD Primary Physician:  Christain Sacramento, MD Reason for Consultation  CHF  Patient ID: Dale [name], male    DOB: 1950/12/03, 71 y.o.   MRN: [id number]  CC: Shortness of breath and weight gain  HPI:    Dale [name]  is a 71 y.o. Dale [name]  is a 71 y.o.  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  Established with me in 2020, has now been on anticoagulation.     Last hospitalization for heart failure was in [facility] on 10/28/2020 with pulmonary edema.  Again admitted with community-acquired pneumonia on 12/29/2021 in [facility], now presents for follow-up as he has noticed worsening leg edema.  While in the hospital, CT of the chest on 12/30/2021 to very large right pleural effusion along with pneumonia and cirrhosis of the liver with portal hypertension, he had approximately about 10 L of fluid aspirated from his right pleural effusion.  He has been home since 01/09/2022.  I made arrangements for him to be admitted to the hospital directly.  Patient found to be severely anemic, had GI workup today, found to have mild gastritis and mild viruses from cirrhosis of the liver.  She has also had pleurocentesis.  He is anuric and still has significant volume overload and I was asked to see regarding heart failure.  Patient states that he is feeling better since hospitalization.  States that his urine output has slightly improved.  Past Medical  History:  Diagnosis Date   Arthritis    CHF (congestive heart failure) (HCC)    Chronic kidney disease    Diabetes mellitus without complication ([place])    Dysrhythmia    A. Fib   Epigastric hernia    Gout    Hyperlipidemia    Hypertension    Sleep apnea    Tubular adenoma of colon 12/2010   Past Surgical History:  Procedure Laterality Date   CATARACT EXTRACTION W/ INTRAOCULAR LENS IMPLANT     right   HIP SURGERY     INGUINAL HERNIA REPAIR  02/22/2009   left   INGUINAL HERNIA REPAIR Right 06/24/2021   Procedure: RIGHT INGUINAL HERNIA REPAIR;  Surgeon: Donnie Mesa, MD;  Location: [place];  Service: General;  Laterality: Right;  LMA AND TAP BLOCK   INSERTION OF MESH Right 06/24/2021   Procedure: INSERTION OF MESH;  Surgeon: Donnie Mesa, MD;  Location: [place];  Service: General;  Laterality: Right;   IR THORACENTESIS ASP PLEURAL SPACE W/IMG GUIDE  01/21/2022   KNEE ARTHROSCOPY  02/23/2008   left   Social History   Tobacco Use   Smoking status: Former    Packs/day: 1.50    Years: 10.00    Total pack years: 15.00    Types: Cigars, Cigarettes    Quit date: 2000    Years since quitting: 23.9   Smokeless tobacco: Former    Types: Chew    Quit date: 2000   Tobacco comments:    quite 1979 cigarettes  Substance Use Topics  Alcohol use: No    Family History  Problem Relation Age of Onset   Diabetes Mother    Lung cancer Father     Marital Status: Married  ROS  Review of Systems  Constitutional: Positive for malaise/fatigue.  Cardiovascular:  Positive for dyspnea on exertion and leg swelling. Negative for chest pain.   Objective      01/22/2022   10:30 AM 01/22/2022   10:15 AM 01/22/2022   10:00 AM  Vitals with BMI  Systolic 633 354 562  Diastolic 75 68 71  Pulse 94 95 97    Blood pressure 131/75, pulse 94, temperature 97.6 F (36.4 C), resp. rate 16, height 5' 10"  (1.778 m), weight 98.9 kg, SpO2 94 %.   Filed Weights   01/22/22 0018 01/22/22 0436 01/22/22  0831  Weight: 102 kg 98.9 kg 98.9 kg    Physical Exam Constitutional:      Comments: Morbidly obese in no acute distress.  Neck:     Vascular: JVD present. No carotid bruit.  Cardiovascular:     Rate and Rhythm: Normal rate. Rhythm irregular.     Pulses:          Posterior tibial pulses are 1+ on the right side and 1+ on the left side.     Heart sounds: Normal heart sounds. No murmur heard.    No gallop.  Pulmonary:     Effort: Pulmonary effort is normal.     Breath sounds: Normal breath sounds.  Abdominal:     General: Bowel sounds are normal.     Palpations: Abdomen is soft.     Comments: Obese. Pannus present  Musculoskeletal:     Right lower leg: Edema (2+ pitting) present.     Left lower leg: Edema (2+ pitting) present.  Skin:    Capillary Refill: Capillary refill takes less than 2 seconds.    Laboratory examination:   Recent Labs    01/20/22 1512 01/21/22 1134 01/22/22 0606  NA 133* 132* 134*  K 3.7 3.7 3.7  CL 94* 100 101  CO2 19* 20* 25  GLUCOSE 198* 188* 142*  BUN 69* 74* 75*  CREATININE 2.50* 2.48* 2.47*  CALCIUM 9.4 8.5* 9.0  GFRNONAA 27* 27* 27*   estimated creatinine clearance is 32.4 mL/min (A) (by C-G formula based on SCr of 2.47 mg/dL (H)).     Latest Ref Rng & Units 01/22/2022    6:06 AM 01/21/2022   11:34 AM 01/20/2022    3:12 PM  CMP  Glucose 70 - 99 mg/dL 142  188  198   BUN 8 - 23 mg/dL 75  74  69   Creatinine 0.61 - 1.24 mg/dL 2.47  2.48  2.50   Sodium 135 - 145 mmol/L 134  132  133   Potassium 3.5 - 5.1 mmol/L 3.7  3.7  3.7   Chloride 98 - 111 mmol/L 101  100  94   CO2 22 - 32 mmol/L 25  20  19    Calcium 8.9 - 10.3 mg/dL 9.0  8.5  9.4   Total Protein 6.5 - 8.1 g/dL  6.5  7.3   Total Bilirubin 0.3 - 1.2 mg/dL  1.3  1.8   Alkaline Phos 38 - 126 U/L  98  117   AST 15 - 41 U/L  98  110   ALT 0 - 44 U/L  71  79       Latest Ref Rng & Units 01/22/2022    6:06  AM 01/21/2022   11:34 AM 01/20/2022    3:12 PM  CBC  WBC 4.0 - 10.5 K/uL  6.4  8.5  12.7   Hemoglobin 13.0 - 17.0 g/dL 11.7  12.3  13.8   Hematocrit 39.0 - 52.0 % 33.8  34.7  39.6   Platelets 150 - 400 K/uL 43  48  89    No results found for: "CHOL", "HDL", "LDLCALC", "LDLDIRECT", "TRIG", "CHOLHDL"  HEMOGLOBIN A1C Lab Results  Component Value Date   HGBA1C 6.4 (H) 01/20/2022   MPG 137 01/20/2022   TSH Recent Labs    01/20/22 1512  TSH 0.790   BNP (last 3 results) Recent Labs    01/20/22 1512  BNP 130.1*   Cardiac Panel (last 3 results) Recent Labs    01/20/22 1512 01/20/22 1800  TROPONINIHS 55* 46*     Medications and allergies   Allergies  Allergen Reactions   Nsaids Other (See Comments)    NOT to take these due to kidney function   Allopurinol Other (See Comments)    Foot pain, not a rash., 19 Mar 2010     Current Meds  Medication Sig   atorvastatin (LIPITOR) 80 MG tablet Take 80 mg by mouth daily.   cholecalciferol (VITAMIN D3) 25 MCG (1000 UNIT) tablet Take 1,000 Units by mouth daily.   diltiazem (CARDIZEM CD) 120 MG 24 hr capsule Take 120 mg by mouth daily.   fenofibrate (TRICOR) 145 MG tablet Take 145 mg by mouth daily.   glipiZIDE (GLUCOTROL XL) 5 MG 24 hr tablet Take 5 mg by mouth daily with breakfast.   torsemide (DEMADEX) 20 MG tablet Take 1 tablet (20 mg total) by mouth 2 (two) times daily. (Patient taking differently: Take 20 mg by mouth in the morning and at bedtime.)    Scheduled Meds:  atorvastatin  80 mg Oral Daily   azithromycin  500 mg Oral Daily   cholecalciferol  1,000 Units Oral Daily   diltiazem  120 mg Oral Daily   fenofibrate  160 mg Oral Daily   furosemide  40 mg Intravenous Daily   insulin aspart  0-9 Units Subcutaneous TID WC   sodium chloride flush  3 mL Intravenous Q12H   Continuous Infusions:  albumin human 50 g (01/22/22 1522)   cefTRIAXone (ROCEPHIN)  IV 2 g (01/21/22 1900)   PRN Meds:.acetaminophen **OR** acetaminophen, ondansetron **OR** ondansetron (ZOFRAN) IV   I/O last 3 completed  shifts: In: 542.7 [P.O.:340; IV Piggyback:202.7] Out: 1551 [Urine:1550; Stool:1] Total I/O In: 300 [I.V.:300] Out: 100 [Urine:100]  Net IO Since Admission: -808.33 mL [01/22/22 1633]   Radiology:   Imaging results have been reviewed  Cardiac Studies:   PCV ECHOCARDIOGRAM COMPLETE 11/04/2020   Narrative Echocardiogram 11/04/2020: Left ventricle cavity is normal in size and wall thickness. Normal global wall motion. Normal LV systolic function with EF 59%. Normal diastolic filling pattern. Mild (Grade I) aortic regurgitation. Mild (Grade I) mitral regurgitation. Previous study on 08/15/2020 reported EF 45-50%, mod MR, mild TR.     PCV MYOCARDIAL PERFUSION WITH LEXISCAN 11/03/2020 Lexiscan nuclear stress test performed using 1-day protocol. Small area of mildly decreased tracer uptake in apical inferior myocardium, likely due to gut attenuation. All segments of left ventricle demonstrated normal wall motion and thickening. Stress LVEF 81%. Low risk study.   Echocardiogram 01/03/2022: Normal LV size, mild LVH, normal wall motion.  EF 60 to 65%. Normal RV size and function. Mild mitral and calcification.  Tricuspid valve is normal.  No pulm hypertension.  EKG:  EKG 01/19/2022: Atrial fibrillation with controlled ventricular response at a rate of 93 bpm, left axis deviation, left anterior fascicular block. Poor R wave progression, cannot exclude anteroseptal infarct old. IVCD, incomplete left bundle branch block. Low voltage complexes, consider pulmonary disease pattern. Compared to 11/19/2021, atrial fibrillation has replaced sinus rhythm.   Assessment   1.  Anasarca 2.  Acute on chronic diastolic heart failure 3.  Massive right pleural effusion, recurrent and ascites 4.  Cirrhosis of the liver 5.  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 Persistent atrial fib 7. Chronic thrombocytopenia  Recommendations:   Patient is presently receiving  albumin.  Will check urine for urinary protein.  Complaint of acute diastolic heart failure is minor compared to massive pleural effusion and ascites and leg edema overall severe anasarca.  He has only had moderate response to daily dose of furosemide IV.  Not a candidate for ACE inhibitor's or ARB due to renal failure.  I suspect he will need a pleural VAC placed for long-term right pleural effusion.  I will add Zaroxolyn 5 mg daily see how he responds and watch his serum creatinine closely due to underlying stage IV chronic kidney disease.  Suspect his cirrhosis of the liver to be secondary to steatosis.  In view of severe thrombocytopenia, mild anemia, gastritis, relative contraindication for anticoagulation exists.  Hence continue rate control for now and will consider anticoagulation once his chronic thrombocytopenia comes back to baseline.   Adrian Prows, MD, [facility] 01/22/2022, 4:33 PM Office: [phone]

## 2022-01-23 ENCOUNTER — Inpatient Hospital Stay (HOSPITAL_COMMUNITY): Payer: Medicare Other

## 2022-01-23 DIAGNOSIS — K7581 Nonalcoholic steatohepatitis (NASH): Secondary | ICD-10-CM

## 2022-01-23 DIAGNOSIS — J918 Pleural effusion in other conditions classified elsewhere: Secondary | ICD-10-CM

## 2022-01-23 DIAGNOSIS — I5033 Acute on chronic diastolic (congestive) heart failure: Secondary | ICD-10-CM | POA: Diagnosis not present

## 2022-01-23 DIAGNOSIS — R188 Other ascites: Secondary | ICD-10-CM

## 2022-01-23 DIAGNOSIS — Z515 Encounter for palliative care: Secondary | ICD-10-CM

## 2022-01-23 DIAGNOSIS — R601 Generalized edema: Secondary | ICD-10-CM

## 2022-01-23 DIAGNOSIS — J948 Other specified pleural conditions: Secondary | ICD-10-CM | POA: Diagnosis not present

## 2022-01-23 DIAGNOSIS — K7460 Unspecified cirrhosis of liver: Secondary | ICD-10-CM

## 2022-01-23 DIAGNOSIS — Z7189 Other specified counseling: Secondary | ICD-10-CM

## 2022-01-23 LAB — CBC
HCT: 27.9 % — ABNORMAL LOW (ref 39.0–52.0)
Hemoglobin: 9.9 g/dL — ABNORMAL LOW (ref 13.0–17.0)
MCH: 34.3 pg — ABNORMAL HIGH (ref 26.0–34.0)
MCHC: 35.5 g/dL (ref 30.0–36.0)
MCV: 96.5 fL (ref 80.0–100.0)
Platelets: 32 10*3/uL — ABNORMAL LOW (ref 150–400)
RBC: 2.89 MIL/uL — ABNORMAL LOW (ref 4.22–5.81)
RDW: 14.1 % (ref 11.5–15.5)
WBC: 4.7 10*3/uL (ref 4.0–10.5)
nRBC: 0 % (ref 0.0–0.2)

## 2022-01-23 LAB — IRON AND TIBC
Iron: 29 ug/dL — ABNORMAL LOW (ref 45–182)
Saturation Ratios: 14 % — ABNORMAL LOW (ref 17.9–39.5)
TIBC: 210 ug/dL — ABNORMAL LOW (ref 250–450)
UIBC: 181 ug/dL

## 2022-01-23 LAB — BASIC METABOLIC PANEL
Anion gap: 13 (ref 5–15)
BUN: 75 mg/dL — ABNORMAL HIGH (ref 8–23)
CO2: 20 mmol/L — ABNORMAL LOW (ref 22–32)
Calcium: 9.1 mg/dL (ref 8.9–10.3)
Chloride: 102 mmol/L (ref 98–111)
Creatinine, Ser: 2.37 mg/dL — ABNORMAL HIGH (ref 0.61–1.24)
GFR, Estimated: 29 mL/min — ABNORMAL LOW (ref >60)
Glucose, Bld: 132 mg/dL — ABNORMAL HIGH (ref 70–99)
Potassium: 3.4 mmol/L — ABNORMAL LOW (ref 3.5–5.1)
Sodium: 135 mmol/L (ref 135–145)

## 2022-01-23 LAB — ALBUMIN, PLEURAL OR PERITONEAL FLUID: Albumin, Fluid: <1.5 g/dL

## 2022-01-23 LAB — BODY FLUID CELL COUNT WITH DIFFERENTIAL
Eos, Fluid: 0 %
Lymphs, Fluid: 66 %
Monocyte-Macrophage-Serous Fluid: 24 % — ABNORMAL LOW (ref 50–90)
Neutrophil Count, Fluid: 10 % (ref 0–25)
Total Nucleated Cell Count, Fluid: 106 cu mm (ref 0–1000)

## 2022-01-23 LAB — GLUCOSE, CAPILLARY
Glucose-Capillary: 129 mg/dL — ABNORMAL HIGH (ref 70–99)
Glucose-Capillary: 204 mg/dL — ABNORMAL HIGH (ref 70–99)
Glucose-Capillary: 149 mg/dL — ABNORMAL HIGH (ref 70–99)
Glucose-Capillary: 122 mg/dL — ABNORMAL HIGH (ref 70–99)

## 2022-01-23 LAB — GRAM STAIN

## 2022-01-23 LAB — PROTIME-INR
INR: 1.9 — ABNORMAL HIGH (ref 0.8–1.2)
Prothrombin Time: 21.4 seconds — ABNORMAL HIGH (ref 11.4–15.2)

## 2022-01-23 LAB — IGG: IgG (Immunoglobin G), Serum: 1654 mg/dL — ABNORMAL HIGH (ref 603–1613)

## 2022-01-23 LAB — AFP TUMOR MARKER: AFP, Serum, Tumor Marker: 2.2 ng/mL (ref 0.0–8.4)

## 2022-01-23 LAB — FERRITIN: Ferritin: 107 ng/mL (ref 24–336)

## 2022-01-23 MED ORDER — POTASSIUM CHLORIDE CRYS ER 20 MEQ PO TBCR
40.0000 meq | EXTENDED_RELEASE_TABLET | Freq: Once | ORAL | Status: AC
  Administered 2022-01-23: 40 meq via ORAL
  Filled 2022-01-23: qty 2

## 2022-01-23 MED ORDER — LIDOCAINE HCL (PF) 1 % IJ SOLN
INTRAMUSCULAR | Status: AC
  Filled 2022-01-23: qty 30

## 2022-01-23 MED ORDER — LIDOCAINE HCL (PF) 1 % IJ SOLN: 5.0000 mL | Freq: Once | INTRAMUSCULAR | Status: AC

## 2022-01-23 MED ORDER — PHYTONADIONE 5 MG PO TABS
5.0000 mg | ORAL_TABLET | Freq: Every day | ORAL | Status: DC
  Administered 2022-01-23 – 2022-01-31 (×9): 5 mg via ORAL
  Filled 2022-01-23 (×9): qty 1

## 2022-01-23 NOTE — Progress Notes (Signed)
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  He returns to [facility] and after 4 days his shortness of breath increased along with orthopnea lower extremity edema he was admitted for further evaluation and treatment noted to have a new right pleural effusion.  Thoracentesis performed 01/21/2022 with 2 L of fluid obtained with a total of 12 L over the last month of pleural fluid drainage.  He is notable for liver dysfunction along with ascites on films and distinct fluid wave on evaluation.  Pulmonary critical care asked to evaluate. Echocardiogram 01/03/2022: Normal LV size, mild LVH, normal wall motion.  EF 60 to 65%. Normal RV size and function. Mild mitral and calcification.  Tricuspid valve is normal.  No pulm hypertension. Pertinent  Medical History   Past Medical History:  Diagnosis Date   Arthritis    CHF (congestive heart failure) ([place])    Chronic kidney disease    Diabetes mellitus without complication ([place])    Dysrhythmia    A. Fib   Epigastric hernia    Gout    Hyperlipidemia    Hypertension    Sleep apnea    Tubular adenoma of colon 12/2010     Significant Hospital Events: Including procedures, antibiotic start and stop dates in addition to other pertinent events   01/21/2022 thoracentesis right for 2 L with IR.  PCCM consult to assess pleural fluid and consider chest tube  12/1 EGD. PCCM signed off. Nephro consult  12/2 PCCM reconsulted for chest tube   Interim History / Subjective:    PCCM asked to see again for reconsideration of chest tube   Objective   Blood pressure 120/80, pulse (!) 104, temperature 98.3 F (36.8 C), temperature source Oral, resp. rate 20, height 5' 10"  (1.778 m), weight 89.7 kg, SpO2 95 %.        Intake/Output Summary (Last 24 hours) at 01/23/2022 1222 Last data filed at 01/23/2022 0903 Gross per 24 hour  Intake 765.1 ml  Output 1900 ml  Net -1134.9 ml   Filed Weights   01/22/22 0436 01/22/22 0831 01/23/22 0532  Weight: 98.9 kg 98.9 kg 89.7 kg    Examination: General: Obese M NAD HEENT: NCAT  Neuro: AAOx4 CV: irir PULM: CTAb. Shallow respirations. 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 This admission he had a thora with 2L off P -wouldn't pursue a chest tube for this -- is not something a chest tube would fix (fluid will cont to accumulate). Is not having resp sx where a symptom mgmnt thora would be useful at this time. 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 IR has been involved for thora/ para   -Palliative consult is pending    Best Practice (right click and "Reselect all SmartList Selections" daily)   Diet/type: Regular consistency (see orders) DVT prophylaxis: not indicated GI prophylaxis: PPI Lines: N/A Foley:  N/A Code Status:   full code Last date of multidisciplinary goals of care discussion [tbd]  Labs   CBC: Recent Labs  Lab 01/20/22 1512 01/21/22 1134 01/22/22 0606 01/23/22 0050  WBC 12.7* 8.5 6.4 4.7  HGB 13.8 12.3* 11.7* 9.9*  HCT 39.6 34.7* 33.8* 27.9*  MCV 95.7 96.1 96.8 96.5  PLT 89* 48* 43* 32*    Basic Metabolic Panel: Recent Labs  Lab 01/20/22 1512 01/21/22 1134 01/22/22 0606 01/23/22 0050  NA 133* 132* 134* 135  K 3.7 3.7 3.7 3.4*  CL 94* 100 101 102  CO2 19* 20* 25 20*  GLUCOSE 198* 188* 142* 132*  BUN 69* 74* 75* 75*  CREATININE 2.50* 2.48* 2.47* 2.37*  CALCIUM 9.4 8.5* 9.0 9.1  MG 2.1  --   --   --    GFR: Estimated Creatinine Clearance: 32.2 mL/min (A) (by C-G formula based on SCr of 2.37 mg/dL (H)). Recent Labs  Lab 01/20/22 1512 01/21/22 0115 01/21/22 1134 01/22/22 0606 01/23/22 0050  PROCALCITON 0.54 0.51  --  0.41  --   WBC 12.7*  --  8.5 6.4 4.7    Liver Function Tests: Recent Labs  Lab 01/20/22 1512 01/21/22 1134  AST 110* 98*  ALT 79* 71*  ALKPHOS 117 98  BILITOT 1.8* 1.3*  PROT 7.3 6.5  ALBUMIN 2.7* 2.2*   No results for input(s): "LIPASE", "AMYLASE" in the last 168 hours. No results for input(s): "AMMONIA" in the last 168 hours.  ABG    Component Value Date/Time   TCO2 24 07/13/2010 2236     Coagulation Profile: Recent Labs  Lab 01/21/22 1134 01/22/22 0606 01/23/22 0050  INR 1.8* 1.8* 1.9*    Cardiac Enzymes: No results for input(s): "CKTOTAL", "CKMB", "CKMBINDEX", "TROPONINI" in the last 168 hours.  HbA1C: Hgb A1c MFr Bld  Date/Time Value Ref Range Status  01/20/2022 03:03 PM 6.4 (H) 4.8 - 5.6 % Final    Comment:    (NOTE)         Prediabetes: 5.7 - 6.4         Diabetes: >6.4         Glycemic control for adults with diabetes: <7.0   06/15/2021 08:23 AM 6.4 (H) 4.8 - 5.6 % Final    Comment:    (NOTE) Pre diabetes:          5.7%-6.4%  Diabetes:              >6.4%  Glycemic control for   <7.0% adults with diabetes      CBG: Recent Labs  Lab 01/22/22 0939 01/22/22 1151 01/22/22 1712 01/22/22 2130 01/23/22 0548  GLUCAP 113* 126* 160* 133* 129*      Critical care time: na    Eliseo Gum MSN, AGACNP-BC [facility] for pager 01/23/2022, 12:22 PM

## 2022-01-23 NOTE — Progress Notes (Signed)
Palliative: Chart review completed.  Attempt to see [name], but he is leaving the room for a procedure.  He states, "you can talk to my wife".  I enter room to speak with wife who tearful as this, "can we meet later?".  Support provided.  PMT to continue to follow.  Conference with attending, bedside nursing staff, transition of care team related to patient condition, needs, goals of care, disposition.  No charge Quinn Axe, NP Palliative medicine team Team phone [phone] Greater than 50% of this time was spent counseling and coordinating care related to the above assessment and plan.

## 2022-01-23 NOTE — Progress Notes (Signed)
[facility] Progress Note  Name: Dale [name] MRN: [id number] DOB: Jan 06, 1951  Chief Complaint:  Shortness of breath  Subjective:  He states made a lot of urine yesterday and overnight.  he had 2 liters UOP over 12/1.  He just got a paracentesis earlier t his afternoon with 1.3 liters of fluid removed.  He feels a little better.  Spoke with his wife and daughter at bedside.  His daughter asked about the future need for dialysis and I let her know not near the need now but may come up in the future if that is something he chose to pursue.   Review of systems:  Reports shortness of breath is a little better Denies chest pain  Denies n/v --------------------  Background on consult: KALVYN DESA is a 71 y.o. male with a history of chronic diastolic CHF, chronic kidney disease stage 3b, diabetes mellitus, atrial fibrillation, gout, hypertension, and sleep apnea who presented to the hospital with shortness of breath.  I called his wife on speakerphone at his bedside and she supplemented his history.  He has had swelling and shortness of breath recently [place] to be able to return home.  Here he was found to have a pleural effusion.  He had a thoracentesis with 2 liters removed.  Note that he has a new diagnosis of cirrhosis.  His team has also ordered a paracentesis.  Note that his Lasix was changed to torsemide recently.  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  GI, pulmonary, nephrology, and palliative care have been consulted.  He had 1.6 liters UOP over 11/30.  Note that he follows at [facility] with me; I last saw him 09/21/21 for follow-up.  I have included his additional lab trends as below.  He doesn't have his hearing aids or phone with him.  We discussed the importance of a low salt diet and he states that he follows this.       Intake/Output Summary (Last 24 hours) at 01/23/2022 1414 Last data filed at 01/23/2022 4268 Gross per 24 hour  Intake 765.1 ml  Output 1900 ml  Net -1134.9 ml    Vitals:  Vitals:   01/23/22 1102 01/23/22 1111 01/23/22 1115 01/23/22 1410  BP: 135/73 135/73 120/80 123/73  Pulse:      Resp:      Temp:    98.1 F (36.7 C)  TempSrc:    Oral  SpO2:      Weight:      Height:         Physical Exam:  General:  adult male in bed in NAD  HEENT: NCAT Eyes: sclera anicteric Neck: supple trachea midline  Heart: S1S2 no rub Lungs: decreased breath sounds anteriorly right lung; increased work of breathing with exertion Abdomen: softly distended/nontender Extremities: 2-3+ edema lower extremities Neuro: reduced hearing; alert and oriented x3; provides hx and follows commands Psych normal mood and affect GU external catheter in place   Medications reviewed   Labs:     Latest Ref Rng & Units 01/23/2022   12:50 AM 01/22/2022    6:06 AM 01/21/2022   11:34 AM  BMP  Glucose 70 - 99 mg/dL 132  142  188   BUN 8 - 23 mg/dL 75  75  74   Creatinine 0.61 - 1.24 mg/dL 2.37  2.47  2.48   Sodium 135 - 145 mmol/L 135  134  132   Potassium 3.5 - 5.1 mmol/L 3.4  3.7  3.7   Chloride 98 - 111 mmol/L 102  101  100   CO2 22 - 32 mmol/L 20  25  20    Calcium 8.9 - 10.3 mg/dL 9.1  9.0  8.5      Assessment/Plan:   # AKI  - pre-renal insults with large volume paracenteses and new diagnosis of cirrhosis.  Slightly over his baseline but very overloaded.  Anticipate will be difficult to diurese as patient with hypoalbuminemia    - optimize volume status as below - he is getting albumin  - check post-void residual bladder scan and in/out cath if over 250 mL urine retained  - Strict ins/outs and daily weights    # Acute on Chronic diastolic CHF  - Continue lasix 80 mg IV BID - has an external catheter  - Stopped daily metolazone  - note that as an outpatient he had been transitioned to  torsemide - agree with the change and once transitioned to oral meds we will need to assess dosing.  Anticipate need for metolazone PRN as well  - Once shortness of breath improves try wrapping legs   # Cirrhosis - with ascites - new diagnosis  - s/p paracentesis with IR earlier this afternoon - Discussed salt and fluid restriction    # Hypokalemia - replete  # Macrocytic anemia  - Check iron stores   # CKD stage 3b - Follows at [facility] with me  - Baseline Cr trends as above.  Cr 1.6 - 2.0    # HTN  - Acceptable control    Disposition - would continue inpatient monitoring    Claudia Desanctis, MD 01/23/2022 2:43 PM

## 2022-01-23 NOTE — Progress Notes (Signed)
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 Irven Shelling office as a direct admission for worsening shortness of breath and leg edema and fluid overload, he had recently increased torsemide to 20 twice daily 11/28. 11/29:Patient was admitted 11/30:unnderwent IR thoracentesis with 2 L fluid removal> SAAG more than 1.1 consistent with hepatic hydrothorax-Gram stain culture no growth Work-up ultrasound ascites, liver cirrhosis, AKI Nephrology GI and cardiology consulted    Subjective: Seen this am Just saw pulm team On RA Shallow breathing with ascites but no shortness of breath Does have dyspnea with minimal activity Overnight afebrile, BP stable Labs reviewed this morning potassium 3.4 creatinine slightly better at 2.3 hemoglobin at 9.9 g platelet further down 32 INR 1.9 UOP charted to liter  Assessment and Plan:  Shortness of breath/fluid overload: Multifactorial due to liver cirrhosis/acute on chronic diastolic CHF Acute on chronic diastolic CHF: Symptomatic despite diuretics at home.  Discussed with cardiology, nephrology also consulted, Lasix increased 40 mg> 80 mg bid 12/1 per nephrology.  Continue to monitor intake output Daily weight, continue fluid restricted and low-salt diet.  Will be challenging to diurese given low albumin, liver  cirrhosisNet IO Since Admission: -1,943.23 mL [01/23/22 1008]  Filed Weights   01/22/22 0436 01/22/22 0831 01/23/22 0532  Weight: 98.9 kg 98.9 kg 89.7 kg     Pneumonia-given leukocytosis and elevated procalcitonin: Imaging with compressive atelectasis in the setting of pleural effusion.continue empiric ceftriaxone x5 days and azithromycin x3 days.  Overall afebrile, WC count stable  Recurrent right Rt pleural effusion Complete collapse of the RML,ubtotal collapse of  RUL w/ mediastinal shift to the left: Seen by pulmonary.  Underwent ultrasound-guided thoracentesis  11/30; w/ 2l hazy yellow fluid removed:  cytology pending, Gram stain culture no growth,LDH 30, SAAG> 1.1, indicating portal hypertension induced hepatic hydrothorax.  Managing with high-dose IV Lasix along with albumin per nephrology, monitor intake output Daily weight.  Unclear if patient benefit with chest tube again-will ask PULM inputs> Repeat chest x-ray shows unchanged large right pleural effusion: Pulmonary advised chest tube will not help, advised palliative care evaluation and has been consulted, continue diuresis as above  AKI on Stage 3a chronic kidney disease (CKD): Baseline creatinine was 1.6- 1.8 recently.  Creatinine remains elevated but slightly better tolerating diuretics high-dose, being managed by nephrology outpatient input, continue IV Lasix and metolazone intermittently as per nephrology.  Monitor renal function.   Recent Labs  Lab 01/20/22 1512 01/21/22 1134 01/22/22 0606 01/23/22 0050  BUN 69* 74* 75* 75*  CREATININE 2.50* 2.48* 2.47* 2.37*    Decompensated liver cirrhosis with moderate volume ascites and coagulopathy: Paracentesis pending.  GI following work-up initiated for liver cirrhosis etiology but suspect likely from fatty liver, rule out other etiology.Of note his CT scan from 2012 showed severe diffuse hepatic steatosis.  Screening EGD 12/1 with a small varices no indication for banding,  hypertension GERD, to consider  barium swallow outpatient, portal hypertensive gastropathy.  Biopsy pending.  Start daily vitamin K monitor INR Recent Labs  Lab 01/21/22 1134 01/22/22 0606 01/23/22 0050  INR 1.8* 1.8* 1.9*     Chronic anemia Thrombocytopenia: Suspect in the setting of liver cirrhosis/chronic disease.  Platelet further dropping avoiding anticoagulation for A-fib.  Monitor CBC.  Recent Labs  Lab 01/20/22 1512 01/21/22 1134 01/22/22 0606 01/23/22 0050  PLT 89* 48* 43* 32*   Ischemia with elevated but flat troponin :in the setting of patient's congestive heart failure  No chest pain. Chronic atrial fibrillation: Rate controlled on Cardizem not on anticoagulation Dr. Einar Gip has been following and discussed and holding anticoagulation due to severe thrombocytopenia   Hypokalemia replete p.o. Diabetes mellitus type 2, well controlled on sliding scale insulin.  Continue to hold home glipizide.  Monitor as below  Recent Labs  Lab 01/20/22 1503 01/20/22 1646 01/22/22 0939 01/22/22 1151 01/22/22 1712 01/22/22 2130 01/23/22 0548  GLUCAP  --    < > 113* 126* 160* 133* 129*  HGBA1C 6.4*  --   --   --   --   --   --    < > = values in this interval not displayed.    Essential hypertension: controlled on cardizem.  Hyperlipidemia: Continue Lipitor Obstructive sleep apnea: cont CPAP bedtime Class 1 obesity Body mass index is 31.91 kg/m.Will benefit with PCP follow-up, weight loss  healthy lifestyle and cont cpap  Goals of care: Currently full code palliative care has been consulted patient and wife understands the incurable nature of the disease with cirrhosis now with fluid overload difficult and challenging to manage in the setting of renal failure.  Pressure injury left buttock POA Pressure Injury 01/20/22 Buttocks Left Stage 2 -  Partial thickness loss of dermis presenting as a shallow open injury with a red, pink wound bed without slough. round 1 cm left buttock  (Active)  01/20/22 1550  Location: Buttocks  Location Orientation: Left  Staging: Stage 2 -  Partial thickness loss of dermis presenting as a shallow open injury with a red, pink wound bed without slough.  Wound Description (Comments): round 1 cm left buttock  Present on Admission:   Dressing Type Foam - Lift dressing to assess site every shift 01/20/22 1927    DVT prophylaxis: SCDs Start: 01/20/22 1524 holding off on anticoagulation due to thrombocytopenia Code Status:   Code Status: Full Code Family Communication: plan of care discussed with patient at bedside. Patient status is: Inpatient because of acute on chronic CHF pleural effusion cirrhosis ascites  Level of care: Telemetry Cardiac  Dispo: The patient is from: home            Anticipated disposition: TBD Objective: Vitals last 24 hrs: Vitals:   01/23/22 0106 01/23/22 0532 01/23/22 0700 01/23/22 0848  BP:  121/74  125/69  Pulse: 99 92  (!) 104  Resp: (!) 23   20  Temp:  (!) 97.3 F (36.3 C) 98 F (36.7 C) 98.3 F (36.8 C)  TempSrc:  Oral  Oral  SpO2: 95%   95%  Weight:  89.7 kg    Height:       Weight change: -3.1 kg  Physical Examination: General exam: AAox3, weak,older appearing HEENT:Oral mucosa moist, Ear/Nose WNL grossly, dentition normal. Respiratory system: diminished BS on right side,m clear on left,no use of accessory muscle Cardiovascular system: S1 & S2 +, regular rate, JVD neg. Gastrointestinal system: Abdomen soft, moderately distended,NT,ND,BS+ Nervous System:Alert, awake, moving  extremities and grossly nonfocal Extremities: LE ankle edema ++, lower extremities warm Skin: No rashes,no icterus. MSK: Normal muscle bulk,tone, power   Medications reviewed:  Scheduled Meds:  atorvastatin  80 mg Oral Daily   azithromycin  500 mg Oral Daily   cholecalciferol  1,000 Units Oral Daily   diltiazem  120 mg Oral Daily   fenofibrate  160 mg Oral Daily   furosemide  80 mg Intravenous BID   insulin aspart   0-9 Units Subcutaneous TID WC   phytonadione  5 mg Oral Daily   sodium chloride flush  3 mL Intravenous Q12H   Continuous Infusions:  albumin human 50 g (01/22/22 1522)   cefTRIAXone (ROCEPHIN)  IV Stopped (01/22/22 2205)   Diet Order             Diet 2 gram sodium Room service appropriate? Yes; Fluid consistency: Thin; Fluid restriction: 1500 mL Fluid  Diet effective now                  Intake/Output Summary (Last 24 hours) at 01/23/2022 1008 Last data filed at 01/23/2022 0903 Gross per 24 hour  Intake 765.1 ml  Output 2000 ml  Net -1234.9 ml   Net IO Since Admission: -1,943.23 mL [01/23/22 1008]  Wt Readings from Last 3 Encounters:  01/23/22 89.7 kg  01/19/22 102.9 kg  11/19/21 101.2 kg     Unresulted Labs (From admission, onward)     Start     Ordered   01/22/22 2841  Basic metabolic panel  Daily at 5am,   R     Question:  Specimen collection method  Answer:  IV Team=IV Team collect   01/21/22 0747   01/22/22 0500  CBC  Daily at 5am,   R     Question:  Specimen collection method  Answer:  IV Team=IV Team collect   01/21/22 0747   01/21/22 1733  Sodium, urine, random  Once,   R        01/21/22 1732   01/21/22 1156  Mitochondrial antibodies  Once,   R       Question:  Specimen collection method  Answer:  IV Team=IV Team collect   01/21/22 1200   01/21/22 1156  Anti-smooth muscle antibody, IgG  Once,   R       Question:  Specimen collection method  Answer:  IV Team=IV Team collect   01/21/22 1200   01/21/22 1056  Protime-INR  Daily at 5am,   R     Question:  Specimen collection method  Answer:  IV Team=IV Team collect   01/21/22 1055   01/20/22 1656  Legionella Pneumophila Serogp 1 Ur Ag  Once,   R        01/20/22 1656   01/20/22 1656  Strep pneumoniae urinary antigen  Once,   R        01/20/22 1656   01/20/22 1656  Expectorated Sputum Assessment w Gram Stain, Rflx to Resp Cult  Once,   R        01/20/22 1656          Data Reviewed: I have personally  reviewed following labs and imaging studies CBC: Recent Labs  Lab 01/20/22 1512 01/21/22 1134 01/22/22 0606 01/23/22 0050  WBC 12.7* 8.5 6.4 4.7  HGB 13.8 12.3* 11.7* 9.9*  HCT 39.6 34.7* 33.8* 27.9*  MCV 95.7 96.1 96.8 96.5  PLT 89* 48* 43* 32*   Basic Metabolic Panel: Recent Labs  Lab 01/20/22 1512  01/21/22 1134 01/22/22 0606 01/23/22 0050  NA 133* 132* 134* 135  K 3.7 3.7 3.7 3.4*  CL 94* 100 101 102  CO2 19* 20* 25 20*  GLUCOSE 198* 188* 142* 132*  BUN 69* 74* 75* 75*  CREATININE 2.50* 2.48* 2.47* 2.37*  CALCIUM 9.4 8.5* 9.0 9.1  MG 2.1  --   --   --    GFR: Estimated Creatinine Clearance: 32.2 mL/min (A) (by C-G formula based on SCr of 2.37 mg/dL (H)). Liver Function Tests: Recent Labs  Lab 01/20/22 1512 01/21/22 1134  AST 110* 98*  ALT 79* 71*  ALKPHOS 117 98  BILITOT 1.8* 1.3*  PROT 7.3 6.5  ALBUMIN 2.7* 2.2*  HbA1C: Recent Labs    01/20/22 1503  HGBA1C 6.4*   Recent Results (from the past 240 hour(s))  Gram stain     Status: None   Collection Time: 01/21/22  8:36 AM   Specimen: Lung, Right; Pleural Fluid  Result Value Ref Range Status   Specimen Description PLEURAL  Final   Special Requests NONE  Final   Gram Stain   Final    FEW WBC SEEN NO ORGANISMS SEEN Performed at [facility], [place] [address], [place], [place] [address]    Report Status 01/21/2022 FINAL  Final  Culture, body fluid w Gram Stain-bottle     Status: None (Preliminary result)   Collection Time: 01/21/22  8:36 AM   Specimen: Pleura  Result Value Ref Range Status   Specimen Description PLEURAL  Final   Special Requests NONE  Final   Culture   Final    NO GROWTH 2 DAYS Performed at [facility] [address], [place], [place] [address]    Report Status PENDING  Incomplete    Antimicrobials: Anti-infectives (From admission, onward)    Start     Dose/Rate Route Frequency Ordered Stop   01/20/22 1745  cefTRIAXone (ROCEPHIN) 2 g in sodium chloride 0.9 % 100  mL IVPB        2 g 200 mL/hr over 30 Minutes Intravenous Every 24 hours 01/20/22 1656 01/25/22 1744   01/20/22 1745  azithromycin (ZITHROMAX) tablet 500 mg        500 mg Oral Daily 01/20/22 1656 01/25/22 0959      Culture/Microbiology    Component Value Date/Time   SDES PLEURAL 01/21/2022 0836   SDES PLEURAL 01/21/2022 0836   SPECREQUEST NONE 01/21/2022 0836   SPECREQUEST NONE 01/21/2022 0836   CULT  01/21/2022 0836    NO GROWTH 2 DAYS Performed at [facility], [place] [address], [place], [place] [address]    REPTSTATUS 01/21/2022 FINAL 01/21/2022 0836   REPTSTATUS PENDING 01/21/2022 0836    Other culture-see note  Radiology Studies: DG Chest Port 1 View  Result Date: 01/23/2022 CLINICAL DATA:  Pleural effusion EXAM: PORTABLE CHEST 1 VIEW COMPARISON:  01/21/2022 FINDINGS: Large pleural effusion is stable finding on the right. Left lung clear. Normal pulmonary vasculature. No pneumothorax. Cardiac silhouette is obscured. IMPRESSION: Large right-sided pleural effusion without interval change. Electronically Signed   By: Sammie Bench M.D.   On: 01/23/2022 09:47     LOS: 3 days   Antonieta Pert, MD [facility]  01/23/2022, 10:08 AM

## 2022-01-23 NOTE — Progress Notes (Signed)
Progress Note for Dale [name]  Subjective: Feeling better, but he is still SOB.  Breathing is still difficult for him.  Objective: Vital signs in last 24 hours: Temp:  [97.3 F (36.3 C)-98.3 F (36.8 C)] 98.3 F (36.8 C) (12/02 0848) Pulse Rate:  [92-105] 104 (12/02 0848) Resp:  [16-23] 20 (12/02 0848) BP: (103-132)/(69-86) 125/69 (12/02 0848) SpO2:  [94 %-95 %] 95 % (12/02 0848) Weight:  [89.7 kg] 89.7 kg (12/02 0532) Last BM Date : 01/22/22  Intake/Output from previous day: 12/01 0701 - 12/02 0700 In: 1062.1 [P.O.:360; I.V.:300; IV Piggyback:402.1] Out: 2000 [Urine:2000] Intake/Output this shift: Total I/O In: 3 [I.V.:3] Out: -   General appearance: alert and no distress Resp: decreased breath sounds in the right lung field, left lung field clear Cardio: regular rate and rhythm [name]: soft, + ascites  Lab Results: Recent Labs    01/21/22 1134 01/22/22 0606 01/23/22 0050  WBC 8.5 6.4 4.7  HGB 12.3* 11.7* 9.9*  HCT 34.7* 33.8* 27.9*  PLT 48* 43* 32*   BMET Recent Labs    01/21/22 1134 01/22/22 0606 01/23/22 0050  NA 132* 134* 135  K 3.7 3.7 3.4*  CL 100 101 102  CO2 20* 25 20*  GLUCOSE 188* 142* 132*  BUN 74* 75* 75*  CREATININE 2.48* 2.47* 2.37*  CALCIUM 8.5* 9.0 9.1   LFT Recent Labs    01/21/22 1134  PROT 6.5  ALBUMIN 2.2*  AST 98*  ALT 71*  ALKPHOS 98  BILITOT 1.3*  BILIDIR 0.5*  IBILI 0.8   PT/INR Recent Labs    01/22/22 0606 01/23/22 0050  LABPROT 20.7* 21.4*  INR 1.8* 1.9*   Hepatitis Panel No results for input(s): "HEPBSAG", "HCVAB", "HEPAIGM", "HEPBIGM" in the last 72 hours. C-Diff No results for input(s): "CDIFFTOX" in the last 72 hours. Fecal Lactopherrin No results for input(s): "FECLLACTOFRN" in the last 72 hours.  Studies/Results: DG Chest Port 1 View  Result Date: 01/23/2022 CLINICAL DATA:  Pleural effusion EXAM: PORTABLE CHEST 1 VIEW COMPARISON:  01/21/2022 FINDINGS: Large pleural effusion is stable finding on the  right. Left lung clear. Normal pulmonary vasculature. No pneumothorax. Cardiac silhouette is obscured. IMPRESSION: Large right-sided pleural effusion without interval change. Electronically Signed   By: Sammie Bench M.D.   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 3) Ascites. 4) Diastolic heart failure. 5) CKD. 6) DM.   The patient feels better, but he continues to struggle with breathing.  There is a mild improvement in his creatinine.  His admission MELD 3.0 was calculated to be 27.  Fluid analysis from the ascites was not reported.  The pleural effusion fluid is consistent with a transudative source.  Plan: 1) Continue with diuresis.  He is -900 ml. 2) Maintain a 2 gram or lower sodium diet. 3) Continue with albumin for now.  LOS: 3 days  Chandani Rogowski D 01/23/2022, 10:23 AM

## 2022-01-23 NOTE — Progress Notes (Signed)
Pt placed on cpap for the night and seems to be tolerating it well at this time.

## 2022-01-23 NOTE — Progress Notes (Signed)
Subjective:  Symptoms are slightly better with regard to dyspnea, still feels generally very weak.  Intake/Output from previous day:  I/O last 3 completed shifts: In: 1162.1 [P.O.:460; I.V.:300; IV Piggyback:402.1] Out: 2951 [Urine:2950; Stool:1] No intake/output data recorded. Net IO Since Admission: -1,946.23 mL [01/23/22 0845]  Blood pressure 121/74, pulse 92, temperature (!) 97.3 F (36.3 C), temperature source Oral, resp. rate (!) 23, height _0  (1.778 m), weight 89.7 kg, SpO2 95 %. Physical Exam Constitutional:      Appearance: He is well-developed.     Comments: Well built and moderately obese in no acute distress  Neck:     Vascular: JVD present. No carotid bruit.  Cardiovascular:     Rate and Rhythm: Normal rate. Rhythm irregularly irregular.     Pulses: Intact distal pulses.          Dorsalis pedis pulses are 1+ on the right side and 1+ on the left side.       Posterior tibial pulses are 1+ on the right side and 1+ on the left side.     Heart sounds: Heart sounds are distant. No murmur heard.    No gallop.  Pulmonary:     Effort: Pulmonary effort is normal. No accessory muscle usage.     Breath sounds: Examination of the right-upper field reveals decreased breath sounds. Examination of the right-middle field reveals decreased breath sounds. Examination of the right-lower field reveals decreased breath sounds. Decreased breath sounds present.  Abdominal:     General: Bowel sounds are normal. There is distension.     Palpations: Abdomen is soft. There is shifting dullness.     Comments: Obese abdomen  Musculoskeletal:     Right lower leg: Edema (1-2) present.     Left lower leg: Edema (1-2+ pitting below knee) present.    Lab Results: Lab Results  Component Value Date   NA 135 01/23/2022   K 3.4 (L) 01/23/2022   CO2 20 (L) 01/23/2022   GLUCOSE 132 (H) 01/23/2022   BUN 75 (H) 01/23/2022   CREATININE 2.37 (H) 01/23/2022   CALCIUM 9.1 01/23/2022   EGFR 29 (L)  10/31/2020   GFRNONAA 29 (L) 01/23/2022    BNP (last 3 results) Recent Labs    01/20/22 1512  BNP 130.1*       Latest Ref Rng & Units 01/23/2022   12:50 AM 01/22/2022    6:06 AM 01/21/2022   11:34 AM  BMP  Glucose 70 - 99 mg/dL 132  142  188   BUN 8 - 23 mg/dL 75  75  74   Creatinine 0.61 - 1.24 mg/dL 2.37  2.47  2.48   Sodium 135 - 145 mmol/L 135  134  132   Potassium 3.5 - 5.1 mmol/L 3.4  3.7  3.7   Chloride 98 - 111 mmol/L 102  101  100   CO2 22 - 32 mmol/L _1 Calcium 8.9 - 10.3 mg/dL 9.1  9.0  8.5       Latest Ref Rng & Units 01/21/2022   11:34 AM 01/20/2022    3:12 PM  Hepatic Function  Total Protein 6.5 - 8.1 g/dL 6.5  7.3   Albumin 3.5 - 5.0 g/dL 2.2  2.7   AST 15 - 41 U/L 98  110   ALT 0 - 44 U/L 71  79   Alk Phosphatase 38 - 126 U/L 98  117   Total Bilirubin 0.3 - 1.2 mg/dL  1.3  1.8   Bilirubin, Direct 0.0 - 0.2 mg/dL 0.5        Latest Ref Rng & Units 01/23/2022   12:50 AM 01/22/2022    6:06 AM 01/21/2022   11:34 AM  CBC  WBC 4.0 - 10.5 K/uL 4.7  6.4  8.5   Hemoglobin 13.0 - 17.0 g/dL 9.9  11.7  12.3   Hematocrit 39.0 - 52.0 % 27.9  33.8  34.7   Platelets 150 - 400 K/uL 32  43  48    Lipid Panel  No results found for: "CHOL", "TRIG", "HDL", "CHOLHDL", "VLDL", "LDLCALC", "LDLDIRECT" Cardiac Panel (last 3 results) No results for input(s): "CKTOTAL", "CKMB", "TROPONINI", "RELINDX" in the last 72 hours.  HEMOGLOBIN A1C Lab Results  Component Value Date   HGBA1C 6.4 (H) 01/20/2022   MPG 137 01/20/2022   TSH Recent Labs    01/20/22 1512  TSH 0.790   Imaging: Imaging results have been reviewed   Cardiac Studies:    PCV ECHOCARDIOGRAM COMPLETE 11/04/2020   Narrative Echocardiogram 11/04/2020: Left ventricle cavity is normal in size and wall thickness. Normal global wall motion. Normal LV systolic function with EF 59%. Normal diastolic filling pattern. Mild (Grade I) aortic regurgitation. Mild (Grade I) mitral regurgitation. Previous  study on 08/15/2020 reported EF 45-50%, mod MR, mild TR.     PCV MYOCARDIAL PERFUSION WITH LEXISCAN 11/03/2020 Lexiscan nuclear stress test performed using 1-day protocol. Small area of mildly decreased tracer uptake in apical inferior myocardium, likely due to gut attenuation. All segments of left ventricle demonstrated normal wall motion and thickening. Stress LVEF 81%. Low risk study.   Echocardiogram 01/03/2022: Normal LV size, mild LVH, normal wall motion.  EF 60 to 65%. Normal RV size and function. Mild mitral and calcification.  Tricuspid valve is normal.  No pulm hypertension.   EKG:   EKG 01/19/2022: Atrial fibrillation with controlled ventricular response at a rate of 93 bpm, left axis deviation, left anterior fascicular block. Poor R wave progression, cannot exclude anteroseptal infarct old. IVCD, incomplete left bundle branch block. Low voltage complexes, consider pulmonary disease pattern. Compared to 11/19/2021, atrial fibrillation has replaced sinus rhythm.   No results found for this or any previous visit (from the past 43800 hour(s)).  Scheduled Meds:  atorvastatin  80 mg Oral Daily   azithromycin  500 mg Oral Daily   cholecalciferol  1,000 Units Oral Daily   diltiazem  120 mg Oral Daily   fenofibrate  160 mg Oral Daily   furosemide  80 mg Intravenous BID   insulin aspart  0-9 Units Subcutaneous TID WC   phytonadione  5 mg Oral Daily   potassium chloride  40 mEq Oral Once   sodium chloride flush  3 mL Intravenous Q12H   Continuous Infusions:  albumin human 50 g (01/22/22 1522)   cefTRIAXone (ROCEPHIN)  IV 2 g (01/22/22 2135)   PRN Meds:.acetaminophen **OR** acetaminophen, ondansetron **OR** ondansetron (ZOFRAN) IV  Cardiac Studies:    PCV ECHOCARDIOGRAM COMPLETE 11/04/2020   Narrative Echocardiogram 11/04/2020: Left ventricle cavity is normal in size and wall thickness. Normal global wall motion. Normal LV systolic function with EF 59%. Normal diastolic  filling pattern. Mild (Grade I) aortic regurgitation. Mild (Grade I) mitral regurgitation. Previous study on 08/15/2020 reported EF 45-50%, mod MR, mild TR.     PCV MYOCARDIAL PERFUSION WITH LEXISCAN 11/03/2020 Lexiscan nuclear stress test performed using 1-day protocol. Small area of mildly decreased tracer uptake in apical inferior myocardium, likely due to  gut attenuation. All segments of left ventricle demonstrated normal wall motion and thickening. Stress LVEF 81%. Low risk study.   Echocardiogram 01/03/2022: Normal LV size, mild LVH, normal wall motion.  EF 60 to 65%. Normal RV size and function. Mild mitral and calcification.  Tricuspid valve is normal.  No pulm hypertension.   EKG:   EKG 01/19/2022: Atrial fibrillation with controlled ventricular response at a rate of 93 bpm, left axis deviation, left anterior fascicular block. Poor R wave progression, cannot exclude anteroseptal infarct old. IVCD, incomplete left bundle branch block. Low voltage complexes, consider pulmonary disease pattern. Compared to 11/19/2021, atrial fibrillation has replaced sinus rhythm.    Assessment    1.  Anasarca 2.  Acute on chronic diastolic heart failure 3.  Massive right pleural effusion, recurrent and ascites 4.  Cirrhosis of the liver 5.  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 Persistent atrial fib CHA2DS2-VASc Score is 5.  Yearly risk of stroke: 7% (A, HTN, DM, Vasc Dz, CHF).  Score of 1=0.6; 2=2.2; 3=3.2; 4=4.8; 5=7.2; 6=9.8; 7=>9.8) -(CHF; HTN; vasc disease DM,  Male = 1; Age <65 =0; 65-74 = 1,  >75 =2; stroke/embolism= 2).   7. Chronic thrombocytopenia   Recommendations:    Patient's presentation is not completely consistent with just purely congestive heart failure.  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  Patient has had excellent diuresis  yesterday of 2 L with 1 dose of Zaroxolyn.  He is now being transitioned to IV 80 mg Lasix twice daily, we will continue to see how he progresses with regard to his volume status.  His leg edema is essentially resolved, still has JVD, he may have a component of mild acute decompensated diastolic heart failure but do not think any further diuresis will be helped by diuretics alone as he has ascites and also massive right pleural effusion, complete recurrence of effusion.  I would strongly recommend that we place a chest tube otherwise not sure how his overall hemodynamics will improve with regard to congestive liver, improve radiation, controlling atrial fibrillation heart rate, quality of life issues.  Patient is not too keen on getting a chest tube but my only suspicion is that with continued complete atelectasis of his lung and marked dyspnea, we will not be able to control his tachycardia or his respiratory status without improving lung ventilation.  Presently in A-fib with RVR, blood pressure is soft and did not want to use any other agents to control his heart rate more than what it is now and want to have relatively well-preserved in pressure to decrease the risk of nephrotoxicity as he already has stage IV chronic kidney disease.  Thrombocytopenia has been persistent.  He has underlying baseline thrombocytopenia but this has gotten worse.  Hence contraindication for anticoagulation exists although he has got high chads vascular risk score.  With regard to heart failure, he appears to be at steady state volume status.  Third spacing of fluids is noncardiac reasons.  Hence I suspect any more aggressive use of diuretics related to worsening of chronic renal failure.  He is awaiting peritoneocentesis. Transition from Lasix to Torsemide tomorrow at 20 mg BID. No anticoagulation from cardiac standpoint. Please let me know if you need assistance further.     Adrian Prows, MD, [facility] 01/23/2022, 8:45  AM Office: [phone] Fax: [phone] Pager: [phone]

## 2022-01-23 NOTE — Procedures (Signed)
PROCEDURE SUMMARY:  Successful US guided paracentesis from right lateral abdomen.  Yielded 1.3 liters of clear yellow fluid.  No immediate complications.  Patient tolerated well.  EBL = trace  Specimen was sent for labs.  Yazmen Briones S Juanjesus Pepperman PA-C 01/23/2022 1:19 PM

## 2022-01-24 ENCOUNTER — Inpatient Hospital Stay (HOSPITAL_COMMUNITY): Payer: Medicare Other

## 2022-01-24 ENCOUNTER — Encounter (HOSPITAL_COMMUNITY): Payer: Self-pay | Admitting: Internal Medicine

## 2022-01-24 DIAGNOSIS — N1831 Chronic kidney disease, stage 3a: Secondary | ICD-10-CM

## 2022-01-24 DIAGNOSIS — Z7189 Other specified counseling: Secondary | ICD-10-CM | POA: Diagnosis not present

## 2022-01-24 DIAGNOSIS — J918 Pleural effusion in other conditions classified elsewhere: Secondary | ICD-10-CM

## 2022-01-24 DIAGNOSIS — K769 Liver disease, unspecified: Secondary | ICD-10-CM

## 2022-01-24 DIAGNOSIS — I5033 Acute on chronic diastolic (congestive) heart failure: Secondary | ICD-10-CM | POA: Diagnosis not present

## 2022-01-24 LAB — GLUCOSE, CAPILLARY
Glucose-Capillary: 133 mg/dL — ABNORMAL HIGH (ref 70–99)
Glucose-Capillary: 150 mg/dL — ABNORMAL HIGH (ref 70–99)
Glucose-Capillary: 155 mg/dL — ABNORMAL HIGH (ref 70–99)

## 2022-01-24 LAB — BASIC METABOLIC PANEL
Anion gap: 14 (ref 5–15)
BUN: 77 mg/dL — ABNORMAL HIGH (ref 8–23)
CO2: 24 mmol/L (ref 22–32)
Calcium: 9.4 mg/dL (ref 8.9–10.3)
Chloride: 100 mmol/L (ref 98–111)
Creatinine, Ser: 2.31 mg/dL — ABNORMAL HIGH (ref 0.61–1.24)
GFR, Estimated: 29 mL/min — ABNORMAL LOW (ref >60)
Glucose, Bld: 141 mg/dL — ABNORMAL HIGH (ref 70–99)
Potassium: 3 mmol/L — ABNORMAL LOW (ref 3.5–5.1)
Sodium: 138 mmol/L (ref 135–145)

## 2022-01-24 LAB — CBC
HCT: 31.2 % — ABNORMAL LOW (ref 39.0–52.0)
Hemoglobin: 11.2 g/dL — ABNORMAL LOW (ref 13.0–17.0)
MCH: 34 pg (ref 26.0–34.0)
MCHC: 35.9 g/dL (ref 30.0–36.0)
MCV: 94.8 fL (ref 80.0–100.0)
Platelets: 31 10*3/uL — ABNORMAL LOW (ref 150–400)
RBC: 3.29 MIL/uL — ABNORMAL LOW (ref 4.22–5.81)
RDW: 14 % (ref 11.5–15.5)
WBC: 5.3 10*3/uL (ref 4.0–10.5)
nRBC: 0 % (ref 0.0–0.2)

## 2022-01-24 LAB — MITOCHONDRIAL ANTIBODIES: Mitochondrial M2 Ab, IgG: <20 Units (ref 0.0–20.0)

## 2022-01-24 LAB — POTASSIUM: Potassium: 3.3 mmol/L — ABNORMAL LOW (ref 3.5–5.1)

## 2022-01-24 LAB — ANTI-SMOOTH MUSCLE ANTIBODY, IGG: F-Actin IgG: 16 Units (ref 0–19)

## 2022-01-24 LAB — PROTIME-INR
INR: 2 — ABNORMAL HIGH (ref 0.8–1.2)
Prothrombin Time: 22.6 seconds — ABNORMAL HIGH (ref 11.4–15.2)

## 2022-01-24 MED ORDER — TORSEMIDE 20 MG PO TABS
40.0000 mg | ORAL_TABLET | Freq: Every day | ORAL | Status: DC
  Administered 2022-01-25 – 2022-01-31 (×7): 40 mg via ORAL
  Filled 2022-01-24 (×7): qty 2

## 2022-01-24 MED ORDER — POTASSIUM CHLORIDE CRYS ER 20 MEQ PO TBCR
30.0000 meq | EXTENDED_RELEASE_TABLET | Freq: Once | ORAL | Status: AC
  Administered 2022-01-24: 30 meq via ORAL
  Filled 2022-01-24: qty 1

## 2022-01-24 MED ORDER — POLYSACCHARIDE IRON COMPLEX 150 MG PO CAPS
150.0000 mg | ORAL_CAPSULE | Freq: Every day | ORAL | Status: DC
  Administered 2022-01-24 – 2022-01-31 (×8): 150 mg via ORAL
  Filled 2022-01-24 (×8): qty 1

## 2022-01-24 MED ORDER — POTASSIUM CHLORIDE CRYS ER 20 MEQ PO TBCR
40.0000 meq | EXTENDED_RELEASE_TABLET | ORAL | Status: AC
  Administered 2022-01-24: 40 meq via ORAL
  Filled 2022-01-24: qty 2

## 2022-01-24 MED ORDER — POTASSIUM CHLORIDE CRYS ER 20 MEQ PO TBCR
40.0000 meq | EXTENDED_RELEASE_TABLET | Freq: Once | ORAL | Status: AC
  Administered 2022-01-24: 40 meq via ORAL
  Filled 2022-01-24: qty 2

## 2022-01-24 NOTE — Progress Notes (Signed)
Subjective: Feeling better.  He feels that he is breathing better.  Objective: Vital signs in last 24 hours: Temp:  [97.4 F (36.3 C)-98.4 F (36.9 C)] 97.4 F (36.3 C) (12/03 0841) Pulse Rate:  [93-106] 104 (12/03 0841) Resp:  [13-26] 18 (12/03 0841) BP: (112-128)/(70-83) 112/71 (12/03 0841) SpO2:  [93 %-96 %] 93 % (12/03 0841) Weight:  [90.8 kg] 90.8 kg (12/03 0525) Last BM Date : 01/22/22  Intake/Output from previous day: 12/02 0701 - 12/03 0700 In: 603 [P.O.:600; I.V.:3] Out: 4850 [Urine:4850] Intake/Output this shift: Total I/O In: 243 [P.O.:240; I.V.:3] Out: 850 [Urine:850]  PE: Gen:  NAD. Lungs: right pleural effusion, left CTA CV:  RRR ABM: + ascites, soft, nontender Ext: + edema  Lab Results: Recent Labs    01/22/22 0606 01/23/22 0050 01/24/22 0036  WBC 6.4 4.7 5.3  HGB 11.7* 9.9* 11.2*  HCT 33.8* 27.9* 31.2*  PLT 43* 32* 31*   BMET Recent Labs    01/22/22 0606 01/23/22 0050 01/24/22 0036  NA 134* 135 138  K 3.7 3.4* 3.0*  CL 101 102 100  CO2 25 20* 24  GLUCOSE 142* 132* 141*  BUN 75* 75* 77*  CREATININE 2.47* 2.37* 2.31*  CALCIUM 9.0 9.1 9.4   LFT No results for input(s): "PROT", "ALBUMIN", "AST", "ALT", "ALKPHOS", "BILITOT", "BILIDIR", "IBILI" in the last 72 hours. PT/INR Recent Labs    01/23/22 0050 01/24/22 0036  LABPROT 21.4* 22.6*  INR 1.9* 2.0*   Hepatitis Panel No results for input(s): "HEPBSAG", "HCVAB", "HEPAIGM", "HEPBIGM" in the last 72 hours. C-Diff No results for input(s): "CDIFFTOX" in the last 72 hours. Fecal Lactopherrin No results for input(s): "FECLLACTOFRN" in the last 72 hours.  Studies/Results: DG CHEST PORT 1 VIEW  Result Date: 01/24/2022 CLINICAL DATA:  Right-sided pleural effusion follow-up. EXAM: PORTABLE CHEST 1 VIEW COMPARISON:  January 23, 2022 FINDINGS: Cardiomediastinal silhouette is stable. No pneumothorax. The left lung is clear. A moderate right pleural effusion with underlying opacity remains.  IMPRESSION: A moderate right pleural effusion with underlying opacity remains. No pneumothorax. No other changes. Electronically Signed   By: Dorise Bullion III M.D.   On: 01/24/2022 13:29   US Paracentesis  Result Date: 01/23/2022 INDICATION: Congestive heart failure and steatotic liver disease with ascites. Request for diagnostic and therapeutic paracentesis. EXAM: ULTRASOUND GUIDED PARACENTESIS MEDICATIONS: 1% lidocaine 10 mL COMPLICATIONS: None immediate. PROCEDURE: Informed written consent was obtained from the patient after a discussion of the risks, benefits and alternatives to treatment. A timeout was performed prior to the initiation of the procedure. Initial ultrasound scanning demonstrates a moderate amount of ascites within the right upper quadrant. The right upper quadrant was prepped and draped in the usual sterile fashion. 1% lidocaine was used for local anesthesia. Following this, a 19 gauge, 7-cm, Yueh catheter was introduced. An ultrasound image was saved for documentation purposes. The paracentesis was performed. The catheter was removed and a dressing was applied. The patient tolerated the procedure well without immediate post procedural complication. FINDINGS: A total of approximately 1.3 L of clear yellow fluid was removed. Samples were sent to the laboratory as requested by the clinical team. IMPRESSION: Successful ultrasound-guided paracentesis yielding 1.3 liters of peritoneal fluid. Procedure performed by: Gareth Eagle, PA-C Electronically Signed   By: Albin Felling M.D.   On: 01/23/2022 13:35   DG Chest Port 1 View  Result Date: 01/23/2022 CLINICAL DATA:  Pleural effusion EXAM: PORTABLE CHEST 1 VIEW COMPARISON:  01/21/2022 FINDINGS: Large pleural effusion is stable finding  on the right. Left lung clear. Normal pulmonary vasculature. No pneumothorax. Cardiac silhouette is obscured. IMPRESSION: Large right-sided pleural effusion without interval change. Electronically Signed   By:  Sammie Bench M.D.   On: 01/23/2022 09:47    Medications: Scheduled:  atorvastatin  80 mg Oral Daily   azithromycin  500 mg Oral Daily   cholecalciferol  1,000 Units Oral Daily   diltiazem  120 mg Oral Daily   fenofibrate  160 mg Oral Daily   furosemide  80 mg Intravenous BID   insulin aspart  0-9 Units Subcutaneous TID WC   iron polysaccharides  150 mg Oral Daily   phytonadione  5 mg Oral Daily   sodium chloride flush  3 mL Intravenous Q12H   [START ON 01/25/2022] torsemide  40 mg Oral Daily   Continuous:  cefTRIAXone (ROCEPHIN)  IV 2 g (01/23/22 1730)    Assessment/Plan: 1) Presumed MASLD.  Unable to calculate SAAG as ascitic fluid albumin is only reported as <1.5. 2) Hepatic hydrothorax. 3) Ascites. 4) Diastolic CHF.   The patient is improving.  His creatinine is stable.  He is diuresing very well and he will be tried on oral treatment with torsemide.  Plan: 1) Continue with supportive care. 2) No new GI recommendations.  LOS: 4 days   Mercy Malena D 01/24/2022, 2:08 PM

## 2022-01-24 NOTE — Progress Notes (Signed)
PROGRESS NOTE Dale [name]  UEK:[id number] DOB: 07-31-1950 DOA: 01/20/2022 PCP: Christain Sacramento, MD   Brief Narrative/Hospital Course: 71 year old male with history of CHF with preserved EF, class I obesity, atrial fibrillation, CKD stage IIIb recent baseline creatinine 21.6 in April, chronic anemia hemoglobin 10 g at baseline, chronic thrombocytopenia platelets 70 K in April, Diabetes last HbA1c 6.4 in April  and OSA on CPAP, recently diagnosed hepatic cirrhosis with portal hypertension,  Recently admitted in [facility]  and discharged 12/29/2021 and after getting treatment for pneumonia and right pleural effusion apparently had a chest tube that put out total of 8-10 L , sent from Dr. Irven Shelling office as a direct admission for worsening shortness of breath and leg edema and fluid overload, he had recently increased torsemide to 20 twice daily 11/28. 11/29:Patient was admitted 11/30:unnderwent IR thoracentesis with 2 L fluid removal> SAAG more than 1.1 consistent with hepatic hydrothorax-Gram stain culture no growth Work-up ultrasound ascites, liver cirrhosis, AKI Nephrology GI and cardiology consulted  11/30: S/p US-guided thoracentesis  11/30 w/ 2l hazy yellow: Work-up no infection transudative, SAAG> 1.1, 12/2: Paracentesis 1.3 L fluid removed  Subjective: Seen and examined this morning, reports mild weight gain see below  But overall better since admission, leg still edematous, hoping to use the bedside chair today, currently on room air.   Wife is at the bedside.  Assessment and Plan:  Acute on chronic diastolic CHF Fluid overload due to liver cirrhosis hypoalbuminemia: Presented with shortness of breath multifactorial due to liver cirrhosis/acute on chronic diastolic CHF.  Recent admission at [facility] needing chest tube, outpatient Lasix and torsemide did not help.  Appreciate nephrology input for diuretic management, on Lasix 80 mg twice daily from 12/2, S/P metolazone x1> still  appears edematous short of breath with right-sided pleural effusion.  Continue diuretics as per nephrology, monitor intake output continue fluid restricted and low-salt diet.  Will be challenging to diurese given low albumin (has received IV albumin)Net IO Since Admission: -6,800.23 mL [01/24/22 1020]  Filed Weights   01/22/22 0831 01/23/22 0532 01/24/22 0525  Weight: 98.9 kg 89.7 kg 90.8 kg     Pneumonia: On admission presented with leukocytosis and elevated procalcitonin>Imaging with compressive atelectasis in the setting of pleural effusion complete ceftriaxone x5 days and azithromycin x3 days.  Afebrile, leukocytosis resolved from 12.7k   Recurrent right Rt pleural effusion Complete collapse of the RML,ubtotal collapse of  RUL w/ mediastinal shift to the left:  S/p US-guided thoracentesis  11/30 w/ 2l hazy yellow fluid removed, transudative fluid :  cytology pending, Gram stain culture no growth,LDH 30, SAAG> 1.1, indicating hepatic hydrothorax.  Managed with high-dose Lasix intermittent albumin/metolazone per nephrology along with low-salt diet fluid restriction. Cxr 12/2-unchanged large right pleural effusion: Pulmonary advised that chest tube will not help/controlled and gated-advised palliative care evaluation, ongoing diuresis per nephro  AKI on Stage 3a chronic kidney disease (CKD): Baseline creatinine was 1.6- 1.8 recently.  Creatinine on admission around 2.4 holding steady slightly improving today nephrology on board and managing with diuretics Elamin, metolazone PRN continue to monitor renal function closely, watch for postvoid residual,monitor strict I/O Recent Labs  Lab 01/20/22 1512 01/21/22 1134 01/22/22 0606 01/23/22 0050 01/24/22 0036  BUN 69* 74* 75* 75* 77*  CREATININE 2.50* 2.48* 2.47* 2.37* 2.31*   Hypokalemia :repleted aggressively 40MEQ PO X2 Recent Labs  Lab 01/20/22 1512 01/21/22 1134 01/22/22 0606 01/23/22 0050 01/24/22 0036  K 3.7 3.7 3.7 3.4* 3.0*  Decompensated liver cirrhosis Moderate volume ascitesC Coagulopathy Normocytic anemia Thrombocytopenia Portal hypertensive gastropathy: GI following work-up initiated for liver cirrhosis etiology but suspect likely from fatty liver as CT scan from 2012 showed severe diffuse hepatic steatosis.  Has decompensated liver cirrhosis with ascites coagulopathy thrombocytopenia anemia. 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  Biopsy pending.  Paracentesis 12/2 with 1.3 L of peritoneal fluid removed-total nucleated cell 106 with neutrophil 10, Gram stain no organism culture pending.  Continue bite K, monitor INR, monitor platelet count with significant thrombocytopenia anemia stable in 2011 g  Recent Labs  Lab 01/21/22 1134 01/22/22 0606 01/23/22 0050 01/24/22 0036  INR 1.8* 1.8* 1.9* 2.0*    Recent Labs  Lab 01/20/22 1512 01/21/22 1134 01/22/22 0606 01/23/22 0050 01/24/22 0036  PLT 89* 48* 43* 32* 31*    Demand ischemia with elevated but flat troponin :in the setting of patient's congestive heart failure.  Seen by Neurology.No chest pain. Chronic atrial fibrillation: Rate controlled on Cardizem not on anticoagulation PTA. Dr. Einar Gip has been following and discussed and holding anticoagulation due to severe thrombocytopenia   Hypokalemia replete p.o. Diabetes mellitus type 2, well controlled on sliding scale insulin.  Continue to hold home glipizide.  Monitor as below  Recent Labs  Lab 01/20/22 1503 01/20/22 1646 01/23/22 0548 01/23/22 1229 01/23/22 1651 01/23/22 2112 01/24/22 0614  GLUCAP  --    < > 129* 204* 149* 122* 133*  HGBA1C 6.4*  --   --   --   --   --   --    < > = values in this interval not displayed.   Essential hypertension: controlled on cardizem.  Hyperlipidemia: Continue Lipitor Obstructive sleep apnea: cont CPAP bedtime Class 1 obesity Body mass index is 31.91  kg/m.Will benefit with PCP follow-up, weight loss  healthy lifestyle and cont cpap  Goals of care: Currently full code palliative care has been consulted patient and wife understands the incurable nature of the disease with cirrhosis now with fluid overload difficult and challenging to manage in the setting of renal failure.  Pressure injury left buttock POA Pressure Injury 01/20/22 Buttocks Left Stage 2 -  Partial thickness loss of dermis presenting as a shallow open injury with a red, pink wound bed without slough. round 1 cm left buttock (Active)  01/20/22 1550  Location: Buttocks  Location Orientation: Left  Staging: Stage 2 -  Partial thickness loss of dermis presenting as a shallow open injury with a red, pink wound bed without slough.  Wound Description (Comments): round 1 cm left buttock  Present on Admission:   Dressing Type Foam - Lift dressing to assess site every shift 01/20/22 1927    DVT prophylaxis: SCDs Start: 01/20/22 1524 holding off on anticoagulation due to thrombocytopenia Code Status:   Code Status: Full Code Family Communication: plan of care discussed with patient at bedside.  Patient status is: Inpatient because of acute on chronic CHF pleural effusion cirrhosis ascites  Level of care: Telemetry Cardiac  Dispo: The patient is from: home            Anticipated disposition: TBD Objective: Vitals last 24 hrs: Vitals:   01/23/22 2304 01/24/22 0236 01/24/22 0525 01/24/22 0841  BP:   128/82 112/71  Pulse: (!) 106 98 (!) 103 (!) 104  Resp: (!) 26 13 17 18   Temp:   97.6 F (36.4 C) (!) 97.4 F (36.3 C)  TempSrc:  Oral Oral  SpO2:  96% 95% 93%  Weight:   90.8 kg   Height:       Weight change: -8.1 kg  Physical Examination: General exam: alert awake oriented, pleasant on room air, older than stated age HEENT:Oral mucosa moist, Ear/Nose WNL grossly Respiratory system: No breath sounds on the right lung, left lung clear,no use of accessory  muscle Cardiovascular system: S1 & S2 +, No JVD. Gastrointestinal system: Abdomen soft, distended, NT,ND, BS+ Nervous System: Alert, awake, moving extremities well, nonfocal no dysarthria  Extremities: LE edema ++,distal peripheral pulses palpable.  Skin: No rashes,no icterus. MSK: Normal muscle bulk,tone, power    Medications reviewed:  Scheduled Meds:  atorvastatin  80 mg Oral Daily   azithromycin  500 mg Oral Daily   cholecalciferol  1,000 Units Oral Daily   diltiazem  120 mg Oral Daily   fenofibrate  160 mg Oral Daily   furosemide  80 mg Intravenous BID   insulin aspart  0-9 Units Subcutaneous TID WC   phytonadione  5 mg Oral Daily   sodium chloride flush  3 mL Intravenous Q12H   Continuous Infusions:  cefTRIAXone (ROCEPHIN)  IV 2 g (01/23/22 1730)   Diet Order             Diet 2 gram sodium Room service appropriate? Yes; Fluid consistency: Thin; Fluid restriction: 1500 mL Fluid  Diet effective now                  Intake/Output Summary (Last 24 hours) at 01/24/2022 1020 Last data filed at 01/24/2022 1007 Gross per 24 hour  Intake 603 ml  Output 5700 ml  Net -5097 ml    Net IO Since Admission: -6,800.23 mL [01/24/22 1020]  Wt Readings from Last 3 Encounters:  01/24/22 90.8 kg  01/19/22 102.9 kg  11/19/21 101.2 kg     Unresulted Labs (From admission, onward)     Start     Ordered   01/23/22 1212  Pathologist smear review  Once,   R        01/23/22 1212   01/22/22 9562  Basic metabolic panel  Daily at 5am,   R     Question:  Specimen collection method  Answer:  IV Team=IV Team collect   01/21/22 0747   01/22/22 0500  CBC  Daily at 5am,   R     Question:  Specimen collection method  Answer:  IV Team=IV Team collect   01/21/22 0747   01/21/22 1733  Sodium, urine, random  Once,   R        01/21/22 1732   01/21/22 1056  Protime-INR  Daily at 5am,   R     Question:  Specimen collection method  Answer:  IV Team=IV Team collect   01/21/22 1055   01/20/22  1656  Legionella Pneumophila Serogp 1 Ur Ag  Once,   R        01/20/22 1656   01/20/22 1656  Strep pneumoniae urinary antigen  Once,   R        01/20/22 1656   01/20/22 1656  Expectorated Sputum Assessment w Gram Stain, Rflx to Resp Cult  Once,   R        01/20/22 1656          Data Reviewed: I have personally reviewed following labs and imaging studies CBC: Recent Labs  Lab 01/20/22 1512 01/21/22 1134 01/22/22 0606 01/23/22 0050 01/24/22 0036  WBC 12.7* 8.5 6.4  4.7 5.3  HGB 13.8 12.3* 11.7* 9.9* 11.2*  HCT 39.6 34.7* 33.8* 27.9* 31.2*  MCV 95.7 96.1 96.8 96.5 94.8  PLT 89* 48* 43* 32* 31*    Basic Metabolic Panel: Recent Labs  Lab 01/20/22 1512 01/21/22 1134 01/22/22 0606 01/23/22 0050 01/24/22 0036  NA 133* 132* 134* 135 138  K 3.7 3.7 3.7 3.4* 3.0*  CL 94* 100 101 102 100  CO2 19* 20* 25 20* 24  GLUCOSE 198* 188* 142* 132* 141*  BUN 69* 74* 75* 75* 77*  CREATININE 2.50* 2.48* 2.47* 2.37* 2.31*  CALCIUM 9.4 8.5* 9.0 9.1 9.4  MG 2.1  --   --   --   --     GFR: Estimated Creatinine Clearance: 33.2 mL/min (A) (by C-G formula based on SCr of 2.31 mg/dL (H)). Liver Function Tests: Recent Labs  Lab 01/20/22 1512 01/21/22 1134  AST 110* 98*  ALT 79* 71*  ALKPHOS 117 98  BILITOT 1.8* 1.3*  PROT 7.3 6.5  ALBUMIN 2.7* 2.2*   HbA1C: No results for input(s): "HGBA1C" in the last 72 hours.  Recent Results (from the past 240 hour(s))  Gram stain     Status: None   Collection Time: 01/21/22  8:36 AM   Specimen: Lung, Right; Pleural Fluid  Result Value Ref Range Status   Specimen Description PLEURAL  Final   Special Requests NONE  Final   Gram Stain   Final    FEW WBC SEEN NO ORGANISMS SEEN Performed at [facility], [address] [place], [place] [address]    Report Status 01/21/2022 FINAL  Final  Culture, body fluid w Gram Stain-bottle     Status: None (Preliminary result)   Collection Time: 01/21/22  8:36 AM   Specimen: Pleura  Result Value  Ref Range Status   Specimen Description PLEURAL  Final   Special Requests NONE  Final   Culture   Final    NO GROWTH 3 DAYS Performed at [facility] [address], [place], [place] [address]    Report Status PENDING  Incomplete  Culture, body fluid w Gram Stain-bottle     Status: None (Preliminary result)   Collection Time: 01/23/22 12:12 PM   Specimen: Fluid  Result Value Ref Range Status   Specimen Description FLUID  Final   Special Requests  PERITONEAL  Final   Culture   Final    NO GROWTH < 24 HOURS Performed at [facility], [place] [address] [place], [place] [address]    Report Status PENDING  Incomplete  Gram stain     Status: None   Collection Time: 01/23/22 12:12 PM   Specimen: Fluid  Result Value Ref Range Status   Specimen Description FLUID  Final   Special Requests  PERITONEAL  Final   Gram Stain   Final    CYTOSPIN SMEAR WBC PRESENT, PREDOMINANTLY PMN NO ORGANISMS SEEN Performed at [facility], [place] [address], [place], [place] [address]    Report Status 01/23/2022 FINAL  Final    Antimicrobials: Anti-infectives (From admission, onward)    Start     Dose/Rate Route Frequency Ordered Stop   01/20/22 1745  cefTRIAXone (ROCEPHIN) 2 g in sodium chloride 0.9 % 100 mL IVPB        2 g 200 mL/hr over 30 Minutes Intravenous Every 24 hours 01/20/22 1656 01/25/22 1744   01/20/22 1745  azithromycin (ZITHROMAX) tablet 500 mg  500 mg Oral Daily 01/20/22 1656 01/25/22 0959      Culture/Microbiology    Component Value Date/Time   SDES FLUID 01/23/2022 1212   SDES FLUID 01/23/2022 1212   SPECREQUEST  PERITONEAL 01/23/2022 1212   SPECREQUEST  PERITONEAL 01/23/2022 1212   CULT  01/23/2022 1212    NO GROWTH < 24 HOURS Performed at [facility], [place] [address], [place], [place] [address]    REPTSTATUS PENDING 01/23/2022 1212   REPTSTATUS 01/23/2022 FINAL 01/23/2022 1212    Other culture-see note  Radiology Studies: US  Paracentesis  Result Date: 01/23/2022 INDICATION: Congestive heart failure and steatotic liver disease with ascites. Request for diagnostic and therapeutic paracentesis. EXAM: ULTRASOUND GUIDED PARACENTESIS MEDICATIONS: 1% lidocaine 10 mL COMPLICATIONS: None immediate. PROCEDURE: Informed written consent was obtained from the patient after a discussion of the risks, benefits and alternatives to treatment. A timeout was performed prior to the initiation of the procedure. Initial ultrasound scanning demonstrates a moderate amount of ascites within the right upper quadrant. The right upper quadrant was prepped and draped in the usual sterile fashion. 1% lidocaine was used for local anesthesia. Following this, a 19 gauge, 7-cm, Yueh catheter was introduced. An ultrasound image was saved for documentation purposes. The paracentesis was performed. The catheter was removed and a dressing was applied. The patient tolerated the procedure well without immediate post procedural complication. FINDINGS: A total of approximately 1.3 L of clear yellow fluid was removed. Samples were sent to the laboratory as requested by the clinical team. IMPRESSION: Successful ultrasound-guided paracentesis yielding 1.3 liters of peritoneal fluid. Procedure performed by: Gareth Eagle, PA-C Electronically Signed   By: Albin Felling M.D.   On: 01/23/2022 13:35   DG Chest Port 1 View  Result Date: 01/23/2022 CLINICAL DATA:  Pleural effusion EXAM: PORTABLE CHEST 1 VIEW COMPARISON:  01/21/2022 FINDINGS: Large pleural effusion is stable finding on the right. Left lung clear. Normal pulmonary vasculature. No pneumothorax. Cardiac silhouette is obscured. IMPRESSION: Large right-sided pleural effusion without interval change. Electronically Signed   By: Sammie Bench M.D.   On: 01/23/2022 09:47     LOS: 4 days   Antonieta Pert, MD [facility]  01/24/2022, 10:20 AM

## 2022-01-24 NOTE — Progress Notes (Signed)
[facility] Progress Note  Name: Dale [name] MRN: [id number] DOB: Feb 03, 1951  Chief Complaint:  Shortness of breath  Subjective:  He had 4.9 liters UOP over 12/2.  Paracentesis on 12/2 with 1.3 liters off.  Spoke with his family at bedside including his wife and youngest daughter and her husband.   Review of systems:    Reports shortness of breath is much better Denies chest pain  Denies n/v --------------------  Background on consult: GUADALUPE NICKLESS is a 71 y.o. male with a history of chronic diastolic CHF, chronic kidney disease stage 3b, diabetes mellitus, atrial fibrillation, gout, hypertension, and sleep apnea who presented to the hospital with shortness of breath.  I called his wife on speakerphone at his bedside and she supplemented his history.  He has had swelling and shortness of breath recently [place] to be able to return home.  Here he was found to have a pleural effusion.  He had a thoracentesis with 2 liters removed.  Note that he has a new diagnosis of cirrhosis.  His team has also ordered a paracentesis.  Note that his Lasix was changed to torsemide recently.  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  GI, pulmonary, nephrology, and palliative care have been consulted.  He had 1.6 liters UOP over 11/30.  Note that he follows at [place] with me; I last saw him 09/21/21 for follow-up.  I have included his additional lab trends as below.  He doesn't have his hearing aids or phone with him.  We discussed the importance of a low salt diet and he states that he follows this.      Intake/Output Summary (Last 24 hours) at 01/24/2022 1102 Last data filed at 01/24/2022 1007 Gross per 24 hour  Intake 603 ml  Output 4600 ml  Net -3997 ml    Vitals:  Vitals:   01/23/22 2304 01/24/22 0236 01/24/22 0525 01/24/22  0841  BP:   128/82 112/71  Pulse: (!) 106 98 (!) 103 (!) 104  Resp: (!) 26 13 17 18   Temp:   97.6 F (36.4 C) (!) 97.4 F (36.3 C)  TempSrc:   Oral Oral  SpO2:  96% 95% 93%  Weight:   90.8 kg   Height:         Physical Exam:    General:  adult male in bed in NAD  HEENT: NCAT Eyes: sclera anicteric Neck: supple trachea midline  Heart: S1S2 no rub Lungs: right lung with pleural rub, reduced breath sounds; left lung clear to auscultation; unlabored   Abdomen: softly distended/nontender Extremities: 2+ edema lower extremities Neuro: reduced hearing; alert and oriented x3; provides hx and follows commands Psych normal mood and affect GU no foley   Medications reviewed   Labs:     Latest Ref Rng & Units 01/24/2022   12:36 AM 01/23/2022   12:50 AM 01/22/2022    6:06 AM  BMP  Glucose 70 - 99 mg/dL 141  132  142   BUN 8 - 23 mg/dL 77  75  75   Creatinine 0.61 - 1.24 mg/dL 2.31  2.37  2.47   Sodium 135 - 145 mmol/L 138  135  134   Potassium 3.5 - 5.1 mmol/L 3.0  3.4  3.7   Chloride 98 - 111 mmol/L 100  102  101  CO2 22 - 32 mmol/L 24  20  25    Calcium 8.9 - 10.3 mg/dL 9.4  9.1  9.0      Assessment/Plan:   # AKI  - pre-renal insults with large volume paracenteses and new diagnosis of cirrhosis.  Slightly over his baseline but very overloaded.  Anticipate will be difficult to diurese as patient with hypoalbuminemia    - optimize volume status as below - s/p albumin  - Strict ins/outs and daily weights    # Acute on Chronic diastolic CHF  - weight down significantly with thoracentesis, paracentesis, and diuretics - Lasix 80 mg IV BID today and will try torsemide 40 mg daily starting tomorrow - Note that as an outpatient he had been transitioned to torsemide but this was a day or two prior to admission - agree with the change.  May need metolazone weekly PRN as well  - Have requested that nursing wrap legs with ACE bandages for compression  # Cirrhosis - with ascites - new  diagnosis  - s/p paracentesis with IR on 12/2 - 1.3 liters off - Discussed salt and fluid restriction    # Hypokalemia - got 40 meq K today this am. ordered additional 40 meq once today  - repeat K and anticipate additional need for K this afternoon given on IV lasix  # Macrocytic anemia  - Iron deficiency - start oral iron daily  # CKD stage 3b - Follows at [facility] with Dr. Royce Macadamia  - Baseline Cr trends as above.  Cr 1.6 - 2.0    # HTN  - Acceptable control    Disposition - would continue inpatient monitoring    Claudia Desanctis, MD 01/24/2022 11:30 AM

## 2022-01-24 NOTE — Evaluation (Signed)
Occupational Therapy Evaluation Patient Details Name: Dale [name] MRN: [id number] DOB: 1951/01/21 Today's Date: 01/24/2022   History of Present Illness 71 y.o. male admitted with acute on chronic diastolic heart failure, asymmetric right pleural effusion, liver cirrhosis with abdominal distention. He had 4.9 liters UOP over 12/2.  Paracentesis on 12/2 with 1.3 liters off. Pt with past medical history of HFpEF, class I obesity, A-fib, CKD 3, chronic anemia, chronic thrombocytopenia with platelets 70 K, diabetes A1c 6.4 April, OSA on CPAP, recently diagnosed hepatic cirrhosis with portal hypertension   Clinical Impression   Dale [name] was evaluated s/p the above admission list, he is generally indep at baseline however has needed AD and increased assist recently due to general decline in health. Upon evaluation pt demonstrated functional limitations due to poor activity tolerance and generalized weakness. Overall he required min A for transfers and limited mobility with RW. Due to deficits listed below he also requires up to min A for ADLs. OT to continue to follow acutely. Recommend d/c to home with [facility] and assist from family.    Recommendations for follow up therapy are one component of a multi-disciplinary discharge planning process, led by the attending physician.  Recommendations may be updated based on patient status, additional functional criteria and insurance authorization.   Follow Up Recommendations  Home health OT     Assistance Recommended at Discharge Frequent or constant Supervision/Assistance  Patient can return home with the following A little help with walking and/or transfers;A little help with bathing/dressing/bathroom;Assistance with cooking/housework    Functional Status Assessment  Patient has had a recent decline in their functional status and demonstrates the ability to make significant improvements in function in a reasonable and predictable amount of time.  Equipment  Recommendations  Other (comment)    Recommendations for Other Services       Precautions / Restrictions Precautions Precautions: Fall Restrictions Weight Bearing Restrictions: No      Mobility Bed Mobility Overal bed mobility: Needs Assistance Bed Mobility: Supine to Sit     Supine to sit: Min assist          Transfers Overall transfer level: Needs assistance Equipment used: Rolling walker (2 wheels) Transfers: Sit to/from Stand Sit to Stand: Min assist                  Balance Overall balance assessment: Needs assistance Sitting-balance support: Feet supported Sitting balance-Leahy Scale: Fair     Standing balance support: Bilateral upper extremity supported, During functional activity Standing balance-Leahy Scale: Poor                             ADL either performed or assessed with clinical judgement   ADL Overall ADL's : Needs assistance/impaired Eating/Feeding: Independent;Sitting   Grooming: Set up;Sitting   Upper Body Bathing: Set up;Sitting   Lower Body Bathing: Minimal assistance;Sit to/from stand   Upper Body Dressing : Set up;Sitting   Lower Body Dressing: Minimal assistance;Sit to/from stand   Toilet Transfer: Minimal assistance;Rolling walker (2 wheels);Ambulation   Toileting- Clothing Manipulation and Hygiene: Min guard;Sitting/lateral lean       Functional mobility during ADLs: Minimal assistance;Rolling walker (2 wheels) General ADL Comments: self limiting, poor activity tolerance     Vision Baseline Vision/History: 1 Wears glasses Ability to See in Adequate Light: 0 Adequate Vision Assessment?: No apparent visual deficits     Perception Perception Perception Tested?: No   Praxis Praxis Praxis tested?: Not tested  Pertinent Vitals/Pain Pain Assessment Pain Assessment: Faces Faces Pain Scale: Hurts a little bit Pain Location: abdomen Pain Descriptors / Indicators: Discomfort Pain Intervention(s):  Limited activity within patient's tolerance, Monitored during session     Hand Dominance Right   Extremity/Trunk Assessment Upper Extremity Assessment Upper Extremity Assessment: Generalized weakness   Lower Extremity Assessment Lower Extremity Assessment: Generalized weakness   Cervical / Trunk Assessment Cervical / Trunk Assessment: Normal   Communication Communication Communication: No difficulties   Cognition Arousal/Alertness: Awake/alert Behavior During Therapy: WFL for tasks assessed/performed Overall Cognitive Status: Within Functional Limits for tasks assessed                                       General Comments  VSS on RA, HR to 130 with activity    Exercises     Shoulder Instructions      Home Living Family/patient expects to be discharged to:: Private residence Living Arrangements: Spouse/significant other;Children Available Help at Discharge: Family;Available 24 hours/day Type of Home: House Home Access: Stairs to enter     Home Layout: One level     Bathroom Shower/Tub: Hospital doctor Toilet: Handicapped height     Home Equipment: Conservation officer, nature (2 wheels);Shower seat;Grab bars - tub/shower          Prior Functioning/Environment Prior Level of Function : Independent/Modified Independent             Mobility Comments: using RW recently ADLs Comments: generally indep, recently needing wife assist for bathing and dressing        OT Problem List: Decreased strength;Decreased range of motion;Decreased activity tolerance;Impaired balance (sitting and/or standing);Decreased safety awareness;Decreased knowledge of use of DME or AE;Decreased knowledge of precautions      OT Treatment/Interventions: Self-care/ADL training;Therapeutic exercise;DME and/or AE instruction;Cognitive remediation/compensation;Patient/family education;Balance training    OT Goals(Current goals can be found in the care plan section) Acute  Rehab OT Goals Patient Stated Goal: home OT Goal Formulation: With patient Time For Goal Achievement: 02/07/22 Potential to Achieve Goals: Good ADL Goals Pt Will Perform Grooming: with modified independence;standing Pt Will Perform Lower Body Dressing: with supervision;sit to/from stand Pt Will Transfer to Toilet: with supervision;ambulating Additional ADL Goal #1: Pt will complete at least 10 minutes of OOB funcitonal activity to demosntrated increased activity tolerance  OT Frequency: Min 2X/week    Co-evaluation              AM-PAC OT "6 Clicks" Daily Activity     Outcome Measure Help from another person eating meals?: None Help from another person taking care of personal grooming?: A Little Help from another person toileting, which includes using toliet, bedpan, or urinal?: A Little Help from another person bathing (including washing, rinsing, drying)?: A Little Help from another person to put on and taking off regular upper body clothing?: A Little Help from another person to put on and taking off regular lower body clothing?: A Little 6 Click Score: 19   End of Session Equipment Utilized During Treatment: Rolling walker (2 wheels) Nurse Communication: Mobility status  Activity Tolerance: Patient tolerated treatment well Patient left: in bed;with call bell/phone within reach  OT Visit Diagnosis: Unsteadiness on feet (R26.81);Other abnormalities of gait and mobility (R26.89);Muscle weakness (generalized) (M62.81)                Time: 4854-6270 OT Time Calculation (min): 16 min Charges:  OT General  Charges $OT Visit: 1 Visit OT Evaluation $OT Eval Moderate Complexity: 1 Mod    Kaitlinn Iversen D Causey 01/24/2022, 5:01 PM

## 2022-01-24 NOTE — Consult Note (Signed)
Consultation Note Date: 01/24/2022   Patient Name: Dale [name]  DOB: 05/14/1950  MRN: [id number]  Age / Sex: 71 y.o., male  PCP: Dale Sacramento, MD Referring Physician: Antonieta Pert, MD  Reason for Consultation: Establishing goals of care  HPI/Patient Profile: 71 y.o. male  with past medical history of HFpEF, class I obesity, A-fib, CKD 3, chronic anemia, chronic thrombocytopenia with platelets 70 K, diabetes A1c 6.4 Dale [name], OSA on CPAP, recently diagnosed hepatic cirrhosis with portal hypertension admitted on 01/20/2022 with acute on chronic diastolic heart failure, asymmetric right pleural effusion, liver cirrhosis with abdominal distention.   Clinical Assessment and Goals of Care: I have reviewed medical records including EPIC notes, labs and imaging, received report from RN, assessed the patient.  [name], is sitting up in the [place] chair in his room.  He greets me, making and mostly keeping eye contact.  He appears acutely/chronically ill.  He is alert and oriented x3, able to make his needs known.  His wife of 48 years, Dale [name], is present at bedside along with daughter Dale [name] and her husband Dale [name] and 2 grandchildren.  Youngest grandson and Dale [name] leave the room for discussion.  We meet at the bedside to discuss diagnosis prognosis, GOC, EOL wishes, disposition and options.  I introduced Palliative Medicine as specialized medical care for people living with serious illness. It focuses on providing relief from the symptoms and stress of a serious illness. The goal is to improve quality of life for both the patient and the family.  We discussed a brief life review of the patient.  Mr. and Mrs. Dale [name] have been married for 49 years.  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  He shared that he worked another 8 years part-time at [employer].  We then focused  on their current illness.  We talk in detail about his acute and chronic health concerns including, but not limited to, kidney dysfunction (he has been seeing Dr. Royce [name] for a year), liver dysfunction, heart failure.  We talk about paracentesis/thoracentesis in detail.  They are aware that [name] is expected to continue to accumulate fluid.  We talked about the treatment plan and finding a balance as the heart needs to stay dry and the kidneys need to stay wet.  We did talk about nutrition, hydration, limiting fluids.  Again, we discussed how this will be a difficult balance.  I shared that just like I will get sick again he will to, this is normal and expected with everything that is going on with his body.  The natural disease trajectory and expectations at EOL were discussed.  I attempted to elicit values and goals of care important to the patient.    Advanced directives, concepts specific to code status, were considered and discussed.  We talked about the concept of "treat the treatable, but allowing natural passing".  At this point [name] shares that he would want attempted resuscitation.  We talk about length of  time on life support.  He shares that he would not want tracheotomy.  We talk about the benefits of outpatient palliative services for continued goals of care discussion.  Patient and family are considering choices.  Discussed the importance of continued conversation with family and the medical providers regarding overall plan of care and treatment options, ensuring decisions are within the context of the patient's values and GOCs.  Questions and concerns were addressed.  The patient and family was encouraged to call with questions or concerns.  PMT will continue to support holistically.  Conference with attending, bedside nursing staff, transition of care team related to patient condition, needs, goals of care, disposition.   HCPOA NEXT OF KIN -wife, Dale [name]    SUMMARY OF  RECOMMENDATIONS   At this point continue full scope/full code Open to any and all treatments as offered. Time for outcomes   Code Status/Advance Care Planning: Full code -we talk about the concept of "treat the treatable, but allowing natural passing".  I shared that what we want 71 years old is different than what we wanted 45, then what we want if we lived to 51.  I share in his condition, things may change month-to-month, week to week.  Symptom Management:  Per hospitalist, no additional needs at this time.  Palliative Prophylaxis:  Frequent Pain Assessment, Oral Care, and Turn Reposition  Additional Recommendations (Limitations, Scope, Preferences): Full Scope Treatment  Psycho-social/Spiritual:  Desire for further Chaplaincy support:no Additional Recommendations: Caregiving  Support/Resources  Prognosis:  Unable to determine, based on outcomes.   Discharge Planning: To be determined, based on outcomes.      Primary Diagnoses: Present on Admission:  Atrial fibrillation ([place])  Essential hypertension  Hyperlipidemia  Obstructive sleep apnea (adult) (pediatric)  Macrocytic anemia  Stage 3a chronic kidney disease (CKD) ([place])   I have reviewed the medical record, interviewed the patient and family, and examined the patient. The following aspects are pertinent.  Past Medical History:  Diagnosis Date   Arthritis    CHF (congestive heart failure) (HCC)    Chronic kidney disease    Diabetes mellitus without complication ([place])    Dysrhythmia    A. Fib   Epigastric hernia    Gout    Hyperlipidemia    Hypertension    Sleep apnea    Tubular adenoma of colon 12/2010   Social History   Socioeconomic History   Marital status: Married    Spouse name: Not on file   Number of children: 2   Years of education: Not on file   Highest education level: Not on file  Occupational History   Not on file  Tobacco Use   Smoking status: Former    Packs/day: 1.50    Years:  10.00    Total pack years: 15.00    Types: Cigars, Cigarettes    Quit date: 2000    Years since quitting: 23.9   Smokeless tobacco: Former    Types: Chew    Quit date: 2000   Tobacco comments:    quite 1979 cigarettes  Vaping Use   Vaping Use: Never used  Substance and Sexual Activity   Alcohol use: No   Drug use: No   Sexual activity: Not on file  Other Topics Concern   Not on file  Social History Narrative   Not on file   Social Determinants of Health   Financial Resource Strain: Not on file  Food Insecurity: No Food Insecurity (01/20/2022)   Hunger Vital  Sign    Worried About Charity fundraiser in the Last Year: Never true    [place] in the Last Year: Never true  Transportation Needs: No Transportation Needs (01/20/2022)   PRAPARE - Hydrologist (Medical): No    Lack of Transportation (Non-Medical): No  Physical Activity: Not on file  Stress: Not on file  Social Connections: Not on file   Family History  Problem Relation Age of Onset   Diabetes Mother    Lung cancer Father    Scheduled Meds:  atorvastatin  80 mg Oral Daily   azithromycin  500 mg Oral Daily   cholecalciferol  1,000 Units Oral Daily   diltiazem  120 mg Oral Daily   fenofibrate  160 mg Oral Daily   furosemide  80 mg Intravenous BID   insulin aspart  0-9 Units Subcutaneous TID WC   iron polysaccharides  150 mg Oral Daily   phytonadione  5 mg Oral Daily   sodium chloride flush  3 mL Intravenous Q12H   [START ON 01/25/2022] torsemide  40 mg Oral Daily   Continuous Infusions:  cefTRIAXone (ROCEPHIN)  IV 2 g (01/23/22 1730)   PRN Meds:.acetaminophen **OR** acetaminophen, ondansetron **OR** ondansetron (ZOFRAN) IV Medications Prior to Admission:  Prior to Admission medications   Medication Sig Start Date End Date Taking? Authorizing Provider  atorvastatin (LIPITOR) 80 MG tablet Take 80 mg by mouth daily. 10/12/18  Yes [provider]  cholecalciferol  (VITAMIN D3) 25 MCG (1000 UNIT) tablet Take 1,000 Units by mouth daily.   Yes [provider]  diltiazem (CARDIZEM CD) 120 MG 24 hr capsule Take 120 mg by mouth daily. 01/08/22 02/07/22 Yes [provider]  fenofibrate (TRICOR) 145 MG tablet Take 145 mg by mouth daily.   Yes [provider]  glipiZIDE (GLUCOTROL XL) 5 MG 24 hr tablet Take 5 mg by mouth daily with breakfast.   Yes [provider]  torsemide (DEMADEX) 20 MG tablet Take 1 tablet (20 mg total) by mouth 2 (two) times daily. Patient taking differently: Take 20 mg by mouth in the morning and at bedtime. 01/19/22 02/18/22 Yes Adrian Prows, MD  ACCU-CHEK GUIDE test strip  05/16/19   [provider]  Lancets Glory Rosebush ULTRASOFT) lancets Test blood sugar twice a day and as needed 10/01/16   [provider]   Allergies  Allergen Reactions   Nsaids Other (See Comments)    NOT to take these due to kidney function   Allopurinol Other (See Comments)    Foot pain, not a rash., 19 Mar 2010   Review of Systems  Unable to perform ROS: Other    Physical Exam Vitals and nursing note reviewed.  Constitutional:      General: He is not in acute distress.    Appearance: He is ill-appearing.  Pulmonary:     Effort: Pulmonary effort is normal. No respiratory distress.  Abdominal:     General: There is distension.     Palpations: Abdomen is soft.  Skin:    General: Skin is warm and dry.  Neurological:     Mental Status: He is alert and oriented to person, place, and time.  Psychiatric:        Mood and Affect: Mood normal.        Behavior: Behavior normal.     Vital Signs: BP 112/71 (BP Location: Right Arm)   Pulse (!) 104   Temp (!) 97.4 F (  36.3 C) (Oral)   Resp 18   Ht 5' 10"  (1.778 m)   Wt 90.8 kg   SpO2 93%   BMI 28.72 kg/m  Pain Scale: 0-10   Pain Score: 0-No pain   SpO2: SpO2: 93 % O2 Device:SpO2: 93 % O2 Flow Rate: .   IO: Intake/output summary:  Intake/Output  Summary (Last 24 hours) at 01/24/2022 1456 Last data filed at 01/24/2022 1007 Gross per 24 hour  Intake 363 ml  Output 4600 ml  Net -4237 ml    LBM: Last BM Date : 01/22/22 Baseline Weight: Weight: 100.9 kg Most recent weight: Weight: 90.8 kg     Palliative Assessment/Data:   Flowsheet Rows    Flowsheet Row Most Recent Value  Intake Tab   Referral Department Hospitalist  Unit at Time of Referral Cardiac/Telemetry Unit  Palliative Care Primary Diagnosis Cardiac  Date Notified 01/21/22  Palliative Care Type New Palliative care  Reason for referral Clarify Goals of Care  Date of Admission 01/20/22  Date first seen by Palliative Care 01/24/22  # of days Palliative referral response time 3 Day(s)  # of days IP prior to Palliative referral 1  Clinical Assessment   Palliative Performance Scale Score 40%  Pain Max last 24 hours Not able to report  Pain Min Last 24 hours Not able to report  Dyspnea Max Last 24 Hours Not able to report  Dyspnea Min Last 24 hours Not able to report  Psychosocial & Spiritual Assessment   Palliative Care Outcomes        Time In: 0900 Time Out: 1015 Time Total: 75 minutes  Greater than 50%  of this time was spent counseling and coordinating care related to the above assessment and plan.  Signed by: Drue Novel, NP   Please contact Palliative Medicine Team phone at [phone] for questions and concerns.  For individual provider: See Shea Evans

## 2022-01-25 ENCOUNTER — Encounter (HOSPITAL_COMMUNITY): Payer: Self-pay | Admitting: Gastroenterology

## 2022-01-25 DIAGNOSIS — Z515 Encounter for palliative care: Secondary | ICD-10-CM | POA: Diagnosis not present

## 2022-01-25 DIAGNOSIS — K769 Liver disease, unspecified: Secondary | ICD-10-CM | POA: Diagnosis not present

## 2022-01-25 DIAGNOSIS — I5033 Acute on chronic diastolic (congestive) heart failure: Secondary | ICD-10-CM | POA: Diagnosis not present

## 2022-01-25 DIAGNOSIS — Z7189 Other specified counseling: Secondary | ICD-10-CM | POA: Diagnosis not present

## 2022-01-25 LAB — BASIC METABOLIC PANEL
Anion gap: 14 (ref 5–15)
BUN: 79 mg/dL — ABNORMAL HIGH (ref 8–23)
CO2: 25 mmol/L (ref 22–32)
Calcium: 9.3 mg/dL (ref 8.9–10.3)
Chloride: 98 mmol/L (ref 98–111)
Creatinine, Ser: 2.31 mg/dL — ABNORMAL HIGH (ref 0.61–1.24)
GFR, Estimated: 29 mL/min — ABNORMAL LOW (ref >60)
Glucose, Bld: 148 mg/dL — ABNORMAL HIGH (ref 70–99)
Potassium: 3.3 mmol/L — ABNORMAL LOW (ref 3.5–5.1)
Sodium: 137 mmol/L (ref 135–145)

## 2022-01-25 LAB — CBC
HCT: 33 % — ABNORMAL LOW (ref 39.0–52.0)
Hemoglobin: 11.4 g/dL — ABNORMAL LOW (ref 13.0–17.0)
MCH: 33.4 pg (ref 26.0–34.0)
MCHC: 34.5 g/dL (ref 30.0–36.0)
MCV: 96.8 fL (ref 80.0–100.0)
Platelets: 34 10*3/uL — ABNORMAL LOW (ref 150–400)
RBC: 3.41 MIL/uL — ABNORMAL LOW (ref 4.22–5.81)
RDW: 14.3 % (ref 11.5–15.5)
WBC: 6.2 10*3/uL (ref 4.0–10.5)
nRBC: 0 % (ref 0.0–0.2)

## 2022-01-25 LAB — PROTIME-INR
INR: 1.9 — ABNORMAL HIGH (ref 0.8–1.2)
Prothrombin Time: 21.7 seconds — ABNORMAL HIGH (ref 11.4–15.2)

## 2022-01-25 LAB — GLUCOSE, CAPILLARY
Glucose-Capillary: 147 mg/dL — ABNORMAL HIGH (ref 70–99)
Glucose-Capillary: 209 mg/dL — ABNORMAL HIGH (ref 70–99)
Glucose-Capillary: 175 mg/dL — ABNORMAL HIGH (ref 70–99)
Glucose-Capillary: 176 mg/dL — ABNORMAL HIGH (ref 70–99)

## 2022-01-25 LAB — SURGICAL PATHOLOGY

## 2022-01-25 LAB — CYTOLOGY - NON PAP

## 2022-01-25 MED ORDER — POTASSIUM CHLORIDE CRYS ER 20 MEQ PO TBCR
40.0000 meq | EXTENDED_RELEASE_TABLET | Freq: Once | ORAL | Status: AC
  Administered 2022-01-25: 40 meq via ORAL
  Filled 2022-01-25: qty 2

## 2022-01-25 MED ORDER — POTASSIUM CHLORIDE CRYS ER 20 MEQ PO TBCR
20.0000 meq | EXTENDED_RELEASE_TABLET | Freq: Every day | ORAL | Status: DC
  Administered 2022-01-25 – 2022-01-26 (×2): 20 meq via ORAL
  Filled 2022-01-25 (×2): qty 1

## 2022-01-25 MED ORDER — ALUM & MAG HYDROXIDE-SIMETH 200-200-20 MG/5ML PO SUSP
30.0000 mL | ORAL | Status: DC | PRN
  Administered 2022-01-25: 30 mL via ORAL
  Filled 2022-01-25: qty 30

## 2022-01-25 NOTE — Progress Notes (Signed)
PT Cancellation Note  Patient Details Name: Dale [name] MRN: [id number] DOB: 1950/12/03   Cancelled Treatment:    Reason Eval/Treat Not Completed: Fatigue/lethargy limiting ability to participate.  Pt is declining PT due to MT having walked on the hall.  Follow up as time and pt allow.   Ramond Dial 01/25/2022, 11:10 AM Mee Hives, PT PhD Acute Rehab Dept. Number: [place] and [place]

## 2022-01-25 NOTE — Progress Notes (Deleted)
Patient having large liquid green stool x 3 tonight.  Rectal pouch placed and foam applied to surrounding red skin of buttock and barrier cream and antifungal powder to scrotum.  Patient also continues to have tan drainage from right ear in copious amounts.

## 2022-01-25 NOTE — Progress Notes (Signed)
01/25/22 1210  Mobility  Activity Transferred to/from BSC  Level of Assistance Minimal assist, patient does 75% or more  Assistive Device  (HHA)  Distance Ambulated (ft) 2 ft  Activity Response Tolerated well  Mobility Referral Yes  $Mobility charge 1 Mobility   Mobility Specialist Progress Note  Pt requesting to use BSC. Was able to have a BM. Returned to chair w/ all needs met and call bell in reach.   Kyla Jones Mobility Specialist  Please contact via SecureChat or Rehab office at [phone]  

## 2022-01-25 NOTE — Progress Notes (Signed)
PROGRESS NOTE Dale [name]  YTK:[id number] DOB: 23-Jul-1950 DOA: 01/20/2022 PCP: Christain Sacramento, MD   Brief Narrative/Hospital Course: 71 year old male with history of CHF with preserved EF, class I obesity, atrial fibrillation, CKD stage IIIb recent baseline creatinine 21.6 in April, chronic anemia hemoglobin 10 g at baseline, chronic thrombocytopenia platelets 70 K in April, Diabetes last HbA1c 6.4 in April  and OSA on CPAP, recently diagnosed hepatic cirrhosis with portal hypertension,  Recently admitted in [facility]  and discharged 12/29/2021 and after getting treatment for pneumonia and right pleural effusion apparently had a chest tube that put out total of 8-10 L , sent from Dr. Irven Shelling office as a direct admission for worsening shortness of breath and leg edema and fluid overload, he had recently increased torsemide to 20 twice daily 11/28. 11/29:Patient was admitted 11/30:underwent IR thoracentesis with 2 L fluid removal, ascitic albumin < 1.5-unable to calculate SAAG pe GI.Gram stain culture no growth Work-up ultrasound ascites, liver cirrhosis, AKI Nephrology GI and cardiology consulted  11/30: S/p US-guided thoracentesis  11/30 w/ 2l hazy yellow: Work-up no infection transudative, SAAG> 1.1, 12/2: Paracentesis 1.3 L fluid removed  Subjective: Seen and examined this morning Walked in the hallway in weeks-had some shortness of breath and tachycardia - improved with rest  Assessment and Plan:  Acute on chronic diastolic CHF Fluid overload due to liver cirrhosis hypoalbuminemia: Presented with shortness of breath multifactorial due to liver cirrhosis/acute on chronic diastolic CHF.  Recent admission at [facility] needing chest tube, outpatient Lasix and torsemide did not help.  Appreciate nephrology input for diuretic management, on IV Lasix 80 mg  bid from12/2> switched to torsemide 40 mgpo daily 12/4 , S/P metolazone x1> weight has significantly improved about 20 pound down  since admission, leg edema improving, has right-sided pleural effusion-pulmonary following and nephrology and cardiology on board.  Cont to monitor intake output continue fluid restricted and low-salt diet.  Will be challenging to diurese given low albumin (has received IV albumin)Net IO Since Admission: -9,550.23 mL [01/25/22 1152]  Filed Weights   01/23/22 0532 01/24/22 0525 01/25/22 0450  Weight: 89.7 kg 90.8 kg 89.8 kg     Pneumonia: On admission presented with leukocytosis and elevated procalcitonin>Imaging with compressive atelectasis in the setting of pleural effusion complete ceftriaxone x5 days and azithromycin x3 days.  Afebrile, leukocytosis resolved from 12.7k   Recurrent right Rt pleural effusion-hepatic hydrothorax: W/ complete collapse of the RML,ubtotal collapse of  RUL w/ mediastinal shift to the left from pleural effusion.S/p US-guided thoracentesis  11/30 w/ 2l hazy yellow fluid removed, transudative fluid :  cytology negative for malignancy,Gram stain culture no growth,LDH 30, ascitic albumin < 1.5- so unable to calculate SAAG. Managed with high-dose Lasix intermittent albumin/metolazone per nephrology along with low-salt diet fluid restriction. Cxr 12/2-unchanged large right pleural effusion: Pulmonary advised that chest tube will not help/contraindicated-continue medical management, palliative care follow-up and repeat thoracentesis for symptomatic relief.  AKI on Stage 3a chronic kidney disease (CKD): Baseline creatinine was 1.6- 1.8 recently.  Creatinine on admission around 2.4 holding overall same tolerating IV Lasix > changed to torsemide 12/4-nephrology managing, metolazone PRN. Cont urine output renal function as below Recent Labs  Lab 01/21/22 1134 01/22/22 0606 01/23/22 0050 01/24/22 0036 01/25/22 0110  BUN 74* 75* 75* 77* 79*  CREATININE 2.48* 2.47* 2.37* 2.31* 2.31*   Intake/Output Summary (Last 24 hours) at 01/25/2022 1152 Last data filed at 01/25/2022 0835 Gross  per 24 hour  Intake 700 ml  Output  3450 ml  Net -2750 ml   Hypokalemia:Due to diuresis, continue to replete again , and recheck. 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 Has decompensated liver cirrhosis with ascites coagulopathy thrombocytopenia anemia. 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  Cytology pending.  Paracentesis 12/2 with 1.3 L of peritoneal fluid removed-total nucleated cell 106 with neutrophil 10, Gram stain no organism . Continue bVitamin K, monitor INR and platelet count as below-with significant thrombocytopenia anemia.  Recent Labs  Lab 01/21/22 1134 01/22/22 0606 01/23/22 0050 01/24/22 0036 01/25/22 0110  INR 1.8* 1.8* 1.9* 2.0* 1.9*   Recent Labs  Lab 01/21/22 1134 01/22/22 0606 01/23/22 0050 01/24/22 0036 01/25/22 0110  PLT 48* 43* 32* 31* 34*  Demand ischemia with elevated but flat troponin :in the setting of patient's congestive heart failure.  Seen by Neurology.No chest pain.  Chronic atrial fibrillation: Rate controlled on Cardizem 120 mg, not on anticoagulation PTA. Dr. Einar Gip has been following and discussed and holding anticoagulation due to severe thrombocytopenia.  Some tachycardia expected on ambulation but monitor.  Diabetes mellitus type 2,:controlled on SSI. Continue to hold home glipizide.  Monitor as below  Recent Labs  Lab 01/20/22 1503 01/20/22 1646 01/24/22 0614 01/24/22 1643 01/24/22 2129 01/25/22 0557 01/25/22 1145  GLUCAP  --    < > 133* 150* 155* 147* 209*  HGBA1C  6.4*  --   --   --   --   --   --    < > = values in this interval not displayed.  Essential hypertension: Blood pressure stable, continue po cardizem.  Hyperlipidemia: Continue fenofibrates Obstructive sleep apnea: cont CPAP bedtime Class 1 obesity Body mass index is 31.91 kg/m.Will benefit with PCP follow-up, weight loss  healthy lifestyle and cont cpap  Goals of care: Palliative care has been involved, overall approach does not appear bright in the setting of hepatic hydrothorax with recurrent pleural effusion decompensated liver cirrhosis with CKD  and CHF. Pressure injury left buttock POA Pressure Injury 01/20/22 Buttocks Left Stage 2 -  Partial thickness loss of dermis presenting as a shallow open injury with a red, pink wound bed without slough. round 1 cm left buttock (Active)  01/20/22 1550  Location: Buttocks  Location Orientation: Left  Staging: Stage 2 -  Partial thickness loss of dermis presenting as a shallow open injury with a red, pink wound bed without slough.  Wound Description (Comments): round 1 cm left buttock  Present on Admission:   Dressing Type Foam - Lift dressing to assess site every shift 01/20/22 1927  DVT prophylaxis: SCDs Start: 01/20/22 1524 holding off on anticoagulation due to thrombocytopenia Code Status:   Code Status: Full Code Family Communication: plan of care discussed with patient at bedside.  Patient status DX:AJOINOMVE because of acute on chronic CHF pleural effusion cirrhosis ascites Level of care: Telemetry Cardiac  Dispo: The patient is from: home            Anticipated disposition: TBD  Objective: Vitals last 24 hrs: Vitals:   01/24/22 1930 01/25/22 0001 01/25/22 0440 01/25/22 0450  BP: 121/67  122/82   Pulse:  (!) 108  84   Resp: 19 15 18    Temp: 97.6 F (36.4 C)  97.6 F (36.4 C)   TempSrc: Oral  Oral   SpO2:   (!) 81%   Weight:    89.8 kg  Height:       Weight change: -0.988 kg  Physical Examination: General exam: AAox3,  weak,older appearing HEENT:Oral mucosa moist, Ear/Nose WNL grossly, dentition normal. Respiratory system: Clear breath sounds on left lung, right side absent breath sounds Cardiovascular system: S1 & S2 +, regular rate. Gastrointestinal system: Abdomen soft, mildly distended NT,BS+ Nervous System:Alert, awake, moving extremities and grossly nonfocal Extremities: LE ankle edema mild, lower extremities warm Skin: No rashes,no icterus. MSK: Normal muscle bulk,tone, power     Medications reviewed:  Scheduled Meds:  atorvastatin  80 mg Oral Daily   cholecalciferol  1,000 Units Oral Daily   diltiazem  120 mg Oral Daily   fenofibrate  160 mg Oral Daily   insulin aspart  0-9 Units Subcutaneous TID WC   iron polysaccharides  150 mg Oral Daily   phytonadione  5 mg Oral Daily   potassium chloride  20 mEq Oral Daily   sodium chloride flush  3 mL Intravenous Q12H   torsemide  40 mg Oral Daily   Continuous Infusions:   Diet Order             Diet 2 gram sodium Room service appropriate? Yes; Fluid consistency: Thin; Fluid restriction: 1500 mL Fluid  Diet effective now                  Intake/Output Summary (Last 24 hours) at 01/25/2022 1152 Last data filed at 01/25/2022 0835 Gross per 24 hour  Intake 700 ml  Output 3450 ml  Net -2750 ml   Net IO Since Admission: -9,550.23 mL [01/25/22 1152]  Wt Readings from Last 3 Encounters:  01/25/22 89.8 kg  01/19/22 102.9 kg  11/19/21 101.2 kg     Unresulted Labs (From admission, onward)     Start     Ordered   01/23/22 1212  Pathologist smear review  Once,   R        01/23/22 1212   01/22/22 6286  Basic metabolic panel  Daily at 5am,   R     Question:  Specimen collection method  Answer:  IV Team=IV Team collect   01/21/22 0747   01/22/22 0500  CBC  Daily at 5am,   R     Question:  Specimen collection method  Answer:  IV Team=IV Team collect   01/21/22 0747   01/21/22 1733  Sodium, urine, random  Once,   R        01/21/22 1732    01/21/22 1056  Protime-INR  Daily at 5am,   R     Question:  Specimen collection method  Answer:  IV Team=IV Team collect   01/21/22 1055   01/20/22 1656  Legionella Pneumophila Serogp 1 Ur Ag  Once,   R        01/20/22 1656   01/20/22 1656  Strep pneumoniae urinary antigen  Once,   R        01/20/22 1656   01/20/22 1656  Expectorated Sputum Assessment w Gram Stain, Rflx to Resp Cult  Once,   R        01/20/22 1656          Data Reviewed: I have personally reviewed following labs and imaging studies CBC: Recent Labs  Lab 01/21/22  1134 01/22/22 0606 01/23/22 0050 01/24/22 0036 01/25/22 0110  WBC 8.5 6.4 4.7 5.3 6.2  HGB 12.3* 11.7* 9.9* 11.2* 11.4*  HCT 34.7* 33.8* 27.9* 31.2* 33.0*  MCV 96.1 96.8 96.5 94.8 96.8  PLT 48* 43* 32* 31* 34*   Basic Metabolic Panel: Recent Labs  Lab 01/20/22 1512 01/21/22 1134 01/22/22 0606 01/23/22 0050 01/24/22 0036 01/24/22 1447 01/25/22 0110  NA 133* 132* 134* 135 138  --  137  K 3.7 3.7 3.7 3.4* 3.0* 3.3* 3.3*  CL 94* 100 101 102 100  --  98  CO2 19* 20* 25 20* 24  --  25  GLUCOSE 198* 188* 142* 132* 141*  --  148*  BUN 69* 74* 75* 75* 77*  --  79*  CREATININE 2.50* 2.48* 2.47* 2.37* 2.31*  --  2.31*  CALCIUM 9.4 8.5* 9.0 9.1 9.4  --  9.3  MG 2.1  --   --   --   --   --   --    GFR: Estimated Creatinine Clearance: 33.1 mL/min (A) (by C-G formula based on SCr of 2.31 mg/dL (H)). Liver Function Tests: Recent Labs  Lab 01/20/22 1512 01/21/22 1134  AST 110* 98*  ALT 79* 71*  ALKPHOS 117 98  BILITOT 1.8* 1.3*  PROT 7.3 6.5  ALBUMIN 2.7* 2.2*  HbA1C: No results for input(s): "HGBA1C" in the last 72 hours.  Recent Results (from the past 240 hour(s))  Gram stain     Status: None   Collection Time: 01/21/22  8:36 AM   Specimen: Lung, Right; Pleural Fluid  Result Value Ref Range Status   Specimen Description PLEURAL  Final   Special Requests NONE  Final   Gram Stain   Final    FEW WBC SEEN NO ORGANISMS SEEN Performed at  [facility], [address] [place], [place] [address]    Report Status 01/21/2022 FINAL  Final  Culture, body fluid w Gram Stain-bottle     Status: None (Preliminary result)   Collection Time: 01/21/22  8:36 AM   Specimen: Pleura  Result Value Ref Range Status   Specimen Description PLEURAL  Final   Special Requests NONE  Final   Culture   Final    NO GROWTH 3 DAYS Performed at [place] [facility] [address], [place], [place] [address]    Report Status PENDING  Incomplete  Culture, body fluid w Gram Stain-bottle     Status: None (Preliminary result)   Collection Time: 01/23/22 12:12 PM   Specimen: Fluid  Result Value Ref Range Status   Specimen Description FLUID  Final   Special Requests  PERITONEAL  Final   Culture   Final    NO GROWTH < 24 HOURS Performed at [facility], [place] [address], [place], [place] [address]    Report Status PENDING  Incomplete  Gram stain     Status: None   Collection Time: 01/23/22 12:12 PM   Specimen: Fluid  Result Value Ref Range Status   Specimen Description FLUID  Final   Special Requests  PERITONEAL  Final   Gram Stain   Final    CYTOSPIN SMEAR WBC PRESENT, PREDOMINANTLY PMN NO ORGANISMS SEEN Performed at [facility], [place] [address], [place], [place] [address]    Report Status 01/23/2022 FINAL  Final    Antimicrobials: Anti-infectives (From admission, onward)    Start     Dose/Rate Route Frequency Ordered Stop   01/20/22  1745  cefTRIAXone (ROCEPHIN) 2 g in sodium chloride 0.9 % 100 mL IVPB        2 g 200 mL/hr over 30 Minutes Intravenous Every 24 hours 01/20/22 1656 01/24/22 1756   01/20/22 1745  azithromycin (ZITHROMAX) tablet 500 mg        500 mg Oral Daily 01/20/22 1656 01/25/22 0959      Culture/Microbiology    Component Value Date/Time   SDES FLUID 01/23/2022 1212   SDES FLUID 01/23/2022 1212   SPECREQUEST  PERITONEAL 01/23/2022 1212   SPECREQUEST  PERITONEAL 01/23/2022 1212   CULT  01/23/2022  1212    NO GROWTH < 24 HOURS Performed at [facility], [place] [address], [place], [place] [address]    REPTSTATUS PENDING 01/23/2022 1212   REPTSTATUS 01/23/2022 FINAL 01/23/2022 1212    Other culture-see note  Radiology Studies: DG CHEST PORT 1 VIEW  Result Date: 01/24/2022 CLINICAL DATA:  Right-sided pleural effusion follow-up. EXAM: PORTABLE CHEST 1 VIEW COMPARISON:  January 23, 2022 FINDINGS: Cardiomediastinal silhouette is stable. No pneumothorax. The left lung is clear. A moderate right pleural effusion with underlying opacity remains. IMPRESSION: A moderate right pleural effusion with underlying opacity remains. No pneumothorax. No other changes. Electronically Signed   By: Dorise Bullion III M.D.   On: 01/24/2022 13:29   US Paracentesis  Result Date: 01/23/2022 INDICATION: Congestive heart failure and steatotic liver disease with ascites. Request for diagnostic and therapeutic paracentesis. EXAM: ULTRASOUND GUIDED PARACENTESIS MEDICATIONS: 1% lidocaine 10 mL COMPLICATIONS: None immediate. PROCEDURE: Informed written consent was obtained from the patient after a discussion of the risks, benefits and alternatives to treatment. A timeout was performed prior to the initiation of the procedure. Initial ultrasound scanning demonstrates a moderate amount of ascites within the right upper quadrant. The right upper quadrant was prepped and draped in the usual sterile fashion. 1% lidocaine was used for local anesthesia. Following this, a 19 gauge, 7-cm, Yueh catheter was introduced. An ultrasound image was saved for documentation purposes. The paracentesis was performed. The catheter was removed and a dressing was applied. The patient tolerated the procedure well without immediate post procedural complication. FINDINGS: A total of approximately 1.3 L of clear yellow fluid was removed. Samples were sent to the laboratory as requested by the clinical team. IMPRESSION: Successful ultrasound-guided  paracentesis yielding 1.3 liters of peritoneal fluid. Procedure performed by: Gareth Eagle, PA-C Electronically Signed   By: Albin Felling M.D.   On: 01/23/2022 13:35     LOS: 5 days   Antonieta Pert, MD [facility]  01/25/2022, 11:52 AM

## 2022-01-25 NOTE — Progress Notes (Signed)
Physical Therapy Evaluation Patient Details Name: Dale [name] MRN: [id number] DOB: [birth year] Today's Date: 01/25/2022  History of Present Illness  70 y.o. male admitted with acute on chronic diastolic heart failure, asymmetric right pleural effusion, liver cirrhosis with abdominal distention. He had 4.9 liters UOP over 12/2.  Paracentesis on 12/2 with 1.3 liters off. Pt with past medical history of HFpEF, class I obesity, A-fib, CKD 3, chronic anemia, chronic thrombocytopenia with platelets 70 K, diabetes A1c 6.4 April, OSA on CPAP, recently diagnosed hepatic cirrhosis with portal hypertension  Clinical Impression  Pt was seen for evaluation with gait distance limited by his elevation of HR and care for fatigue.  Pt was apparently very taxed by a hallway walk earlier, and so discussed the limiting of this distance for eval to conserve energy.  Pt is a bit weaker on RLE but apparently has been related to his ORIF of hip in history.  Will recommend HHPT for follow along to work on current goals and needs and build on his progress.  Pt and family are motivated and attentive to information, and will follow goals of acute PT POC.       Recommendations for follow up therapy are one component of a multi-disciplinary discharge planning process, led by the attending physician.  Recommendations may be updated based on patient status, additional functional criteria and insurance authorization.  Follow Up Recommendations Home health PT      Assistance Recommended at Discharge Intermittent Supervision/Assistance  Patient can return home with the following  A little help with walking and/or transfers;A little help with bathing/dressing/bathroom;Assistance with cooking/housework;Assist for transportation;Help with stairs or ramp for entrance    Equipment Recommendations None recommended by PT  Recommendations for Other Services       Functional Status Assessment Patient has had a recent decline in  their functional status and demonstrates the ability to make significant improvements in function in a reasonable and predictable amount of time.     Precautions / Restrictions Precautions Precautions: Fall Precaution Comments: monitor HR Restrictions Weight Bearing Restrictions: No Other Position/Activity Restrictions: watch lines      Mobility  Bed Mobility               General bed mobility comments: up in chair when PT arrived    Transfers Overall transfer level: Needs assistance Equipment used: Rolling walker (2 wheels) Transfers: Sit to/from Stand Sit to Stand: Min guard           General transfer comment: min guard for safety    Ambulation/Gait Ambulation/Gait assistance: Min guard Gait Distance (Feet): 35 Feet Assistive device: Rolling walker (2 wheels), 1 person hand held assist   Gait velocity: reduced Gait velocity interpretation: <1.31 ft/sec, indicative of household ambulator Pre-gait activities: standing balance ck General Gait Details: fairly normal structure to gait but is slow and taking standing rests to watch elevation of HR  Stairs            Wheelchair Mobility    Modified Rankin (Stroke Patients Only)       Balance Overall balance assessment: Needs assistance Sitting-balance support: Feet supported Sitting balance-Leahy Scale: Good       Standing balance-Leahy Scale: Fair Standing balance comment: less than fair dynamically                             Pertinent Vitals/Pain Pain Assessment Pain Assessment: No/denies pain Faces Pain Scale: Hurts a little bit Pain  Location: abdomen Pain Descriptors / Indicators: Discomfort Pain Intervention(s): Limited activity within patient's tolerance, Monitored during session, Repositioned    Home Living Family/patient expects to be discharged to:: Private residence Living Arrangements: Spouse/significant other;Children Available Help at Discharge: Family;Available  24 hours/day Type of Home: House Home Access: Stairs to enter Entrance Stairs-Rails: None Entrance Stairs-Number of Steps:  (2)   Home Layout: One level Home Equipment: Rolling Walker (2 wheels);Grab bars - tub/shower;Rollator (4 wheels);Shower seat - built in;Toilet riser;Grab bars - toilet Additional Comments: his stairs are usable with a RW    Prior Function Prior Level of Function : Independent/Modified Independent             Mobility Comments: RW newly added but was independent prev       Hand Dominance   Dominant Hand: Right    Extremity/Trunk Assessment   Upper Extremity Assessment Upper Extremity Assessment: Defer to OT evaluation    Lower Extremity Assessment Lower Extremity Assessment: Generalized weakness (RLE more than L due to hip ORIF hx)    Cervical / Trunk Assessment Cervical / Trunk Assessment: Normal  Communication   Communication: HOH  Cognition Arousal/Alertness: Awake/alert Behavior During Therapy: WFL for tasks assessed/performed Overall Cognitive Status: Within Functional Limits for tasks assessed                                 General Comments: agreeable and interacting with family        General Comments General comments (skin integrity, edema, etc.): pt is up to walk on RW in room and limited excursion due to HR being up to 137 quickly with gait.    Exercises     Assessment/Plan    PT Assessment Patient needs continued PT services  PT Problem List Decreased strength;Decreased activity tolerance;Decreased balance;Decreased mobility;Cardiopulmonary status limiting activity       PT Treatment Interventions DME instruction;Gait training;Stair training;Functional mobility training;Therapeutic activities;Therapeutic exercise;Balance training;Neuromuscular re-education;Patient/family education    PT Goals (Current goals can be found in the Care Plan section)  Acute Rehab PT Goals Patient Stated Goal: to get home  soon PT Goal Formulation: With patient/family Time For Goal Achievement: 02/08/22 Potential to Achieve Goals: Good    Frequency Min 3X/week     Co-evaluation               AM-PAC PT "6 Clicks" Mobility  Outcome Measure Help needed turning from your back to your side while in a flat bed without using bedrails?: A Little Help needed moving from lying on your back to sitting on the side of a flat bed without using bedrails?: A Little Help needed moving to and from a bed to a chair (including a wheelchair)?: A Little Help needed standing up from a chair using your arms (e.g., wheelchair or bedside chair)?: A Little Help needed to walk in hospital room?: A Little Help needed climbing 3-5 steps with a railing? : A Lot 6 Click Score: 17    End of Session Equipment Utilized During Treatment: Gait belt Activity Tolerance: Patient limited by fatigue Patient left: with call bell/phone within reach;in chair;with family/visitor present Nurse Communication: Mobility status PT Visit Diagnosis: Unsteadiness on feet (R26.81);Muscle weakness (generalized) (M62.81);Difficulty in walking, not elsewhere classified (R26.2)    Time: 7408-1448 PT Time Calculation (min) (ACUTE ONLY): 21 min   Charges:   PT Evaluation $PT Eval Moderate Complexity: 1 Mod  Ramond Dial 01/25/2022, 3:26 PM  Mee Hives, PT PhD Acute Rehab Dept. Number: [place]

## 2022-01-25 NOTE — Progress Notes (Signed)
Palliative: [name], is sitting up in the [place] chair in his room.  He greets me, making and mostly keeping eye contact.  He is alert and oriented x3, able to make his needs known.  His wife, [name], is present at bedside along with his sister and her husband.  Lanny Hurst shares that he walked in the hallway this morning.  [name] shares that his medications have been changed from IV to by mouth.  Overall, he seems to be progressing.  We talk about doing his own physical therapy while at the hospital.  We also talk about short-term rehab versus home with home health.  At this point, it seems that they would prefer to return home with home health.  They had been receiving RN and PT at home.  We also talk about outpatient palliative services.  At this point they will consider choices.  We discussed how to put palliative services in place in the future if needed.  Conference with attending, bedside nursing staff, transition of care team related to patient condition, needs, goals of care, disposition.  Plan: Continue full scope/full code.  Follow-up outpatient as needed with specialist.  Anticipate home with home health services already in place.  Considering outpatient palliative services.  45 minutes  Quinn Axe, NP Palliative medicine team  Team phone  [facility]

## 2022-01-25 NOTE — Progress Notes (Signed)
[facility] Progress Note   Subjective:  He had 4.3 liters UOP.   Sar up in chair yesterday for a good bit-  weight down over 20 pounds-  is actually lighter than his baseline -  does not seem uremic   71 y.o. male with  chronic diastolic CHF, chronic kidney disease stage 3b, diabetes mellitus, atrial fibrillation, gout, hypertension, and sleep apnea who presented to the hospital with shortness of breath.   He has had swelling and shortness of breath recently [place] return home.  He had a thoracentesis with 2 liters removed.  Note that he has a new diagnosis of cirrhosis.   Note that his Lasix was changed to torsemide recently.  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  GI, pulmonary, nephrology, and palliative care have been consulted.  he follows at [facility] with Dr. Royce Macadamia; last saw him 09/21/21.     Intake/Output Summary (Last 24 hours) at 01/25/2022 0932 Last data filed at 01/25/2022 1740 Gross per 24 hour  Intake 703 ml  Output 3450 ml  Net -2747 ml    Vitals:  Vitals:   01/24/22 1930 01/25/22 0001 01/25/22 0440 01/25/22 0450  BP: 121/67  122/82   Pulse:  (!) 108 84   Resp: 19 15 18    Temp: 97.6 F (36.4 C)  97.6 F (36.4 C)   TempSrc: Oral  Oral   SpO2:   (!) 81%   Weight:    89.8 kg  Height:         Physical Exam:    General:  adult male in bed in NAD  HEENT: NCAT Eyes: sclera anicteric Neck: supple trachea midline  Heart: S1S2 no rub Lungs: right lung with pleural rub, reduced breath sounds; left lung clear to auscultation; unlabored   Abdomen: softly distended/nontender Extremities: 2+ edema lower extremities Neuro: reduced hearing; alert and oriented x3; provides hx and follows commands Psych normal mood and affect GU no foley   Medications reviewed   Labs:     Latest Ref Rng &  Units 01/25/2022    1:10 AM 01/24/2022    2:47 PM 01/24/2022   12:36 AM  BMP  Glucose 70 - 99 mg/dL 148   141   BUN 8 - 23 mg/dL 79   77   Creatinine 0.61 - 1.24 mg/dL 2.31   2.31   Sodium 135 - 145 mmol/L 137   138   Potassium 3.5 - 5.1 mmol/L 3.3  3.3  3.0   Chloride 98 - 111 mmol/L 98   100   CO2 22 - 32 mmol/L 25   24   Calcium 8.9 - 10.3 mg/dL 9.3   9.4      Assessment/Plan:   # AKI  - pre-renal insults with large volume paracenteses and new diagnosis of cirrhosis.  Slightly over his baseline but very overloaded.  Anticipate will be difficult to diurese as patient with hypoalbuminemia    - optimize volume status -  down over 20 pounds-  kidney function staying pretty stable - s/p albumin  - Strict ins/outs and daily weights    # Acute on Chronic diastolic CHF  - weight down significantly with thoracentesis, paracentesis, and diuretics - Lasix 80 mg IV BID today and will try torsemide 40 mg daily starting today - Note that as an  outpatient he had been transitioned to torsemide but this was a day or two prior to admission - agree with the change.  May need higher dose and/or metolazone PRN as well    # Cirrhosis - with ascites - new diagnosis  - s/p paracentesis with IR on 12/2 - 1.3 liters off - Discussed salt and fluid restriction    # Hypokalemia - got 40 meq K today this am. Follow daily   # Macrocytic anemia  - Iron deficiency - start oral iron daily-  hgb over 11  # CKD stage 3b - Follows at [facility] with Dr. Royce Macadamia  - Baseline Cr trends as above.  Cr 1.6 - 2.0    # HTN  - Acceptable control      Louis Meckel, MD 01/25/2022 9:32 AM

## 2022-01-25 NOTE — Progress Notes (Signed)
   01/25/22 1110  Mobility  Activity Ambulated with assistance in hallway  Level of Assistance Contact guard assist, steadying assist  Assistive Device Four wheel walker  Distance Ambulated (ft) 85 ft  Activity Response Tolerated fair  Mobility Referral Yes  $Mobility charge 1 Mobility   Mobility Specialist Progress Note  Pre-Mobility: 98 HR,  During Mobility: 103-160 Post-Mobility: 101HR  Pt EOB and agreeable. X2 standing break and X1 seated break d/t High HR. Pt had some sob but was mainly asymptomatic. Had to bed rolled back to room in the rollator d/t High HR. Left in chair w/ all needs met and call bell in reach. RN notified.   Lucious Groves Mobility Specialist  Please contact via SecureChat or Rehab office at [phone]

## 2022-01-25 NOTE — Progress Notes (Signed)
Cpap placed on by RN. Pt. Tolerating well.

## 2022-01-25 NOTE — Progress Notes (Signed)
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  He returns to [facility] and after 4 days his shortness of breath increased along with orthopnea lower extremity edema he was admitted for further evaluation and treatment noted to have a new right pleural effusion.  Thoracentesis performed 01/21/2022 with 2 L of fluid obtained with a total of 12 L over the last month of pleural fluid drainage.  He is notable for liver dysfunction along with ascites on films and distinct fluid wave on evaluation.  Pulmonary critical care asked to evaluate. Echocardiogram 01/03/2022: Normal LV size, mild LVH, normal wall motion.  EF 60 to 65%. Normal RV size and function. Mild mitral and calcification.  Tricuspid valve is normal.  No pulm hypertension. Pertinent  Medical History   Past Medical History:  Diagnosis Date   Arthritis    CHF (congestive heart failure) ([place])    Chronic kidney disease    Diabetes mellitus without complication ([place])    Dysrhythmia    A. Fib   Epigastric hernia    Gout    Hyperlipidemia    Hypertension    Sleep apnea    Tubular adenoma of colon 12/2010     Significant Hospital Events: Including procedures, antibiotic start and stop dates in addition to other pertinent events   01/21/2022 thoracentesis right for 2 L with IR.  PCCM consult to assess pleural fluid and consider chest tube  12/1 EGD. PCCM signed off. Nephro consult  12/2 PCCM reconsulted for chest tube 12/4 ongoing diuresis. 20lb  weight loss for the stay. .    Interim History / Subjective:   Has to stop and rest when walking. Comfortable at rest. C/o orthopnea. On room air.   Objective   Blood pressure 122/82, pulse 84, temperature 97.6 F (36.4 C), temperature source Oral, resp. rate 18, height 5' 10"  (1.778 m), weight 89.8 kg, SpO2 (!) 81 %.        Intake/Output Summary (Last 24 hours) at 01/25/2022 1123 Last data filed at 01/25/2022 0835 Gross per 24 hour  Intake 700 ml  Output 3450 ml  Net -2750 ml    Filed Weights   01/23/22 0532 01/24/22 0525 01/25/22 0450  Weight: 89.7 kg 90.8 kg 89.8 kg    Examination: General: Adult male in no acute distress HEENT: /AT, PERRL Neuro: Alert, oriented, non-focal CV: IRIR no MRG PULM: Clear bilateral breath sounds. Diminished R base DJ:MEQA, NT, ND Extremities:  no significant edema to lower extremities.  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 This admission he had a thora with 2L off P - Poor role for chest drain in the setting of hepatic hydrothorax - Could consider repeat thoracentesis for worsening dyspnea, comfortable currently.  - effective diuresis will be important. He is diuresing  very well. Appreciate nephrology.  -Ongoing palliative care involvement.  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 IR has been involved for thora/ para   -Palliative consult is pending   PCCM will sign off, please re-consult if we could be of further assistance.    Best Practice (right click and "Reselect all SmartList Selections" daily)    Diet/type: Regular consistency (see orders) DVT prophylaxis: not indicated GI prophylaxis: PPI Lines: N/A Foley:  N/A Code Status:  full code Last date of multidisciplinary goals of care discussion [tbd]  Labs   CBC: Recent Labs  Lab 01/21/22 1134 01/22/22 0606 01/23/22 0050 01/24/22 0036 01/25/22 0110  WBC 8.5 6.4 4.7 5.3 6.2  HGB 12.3* 11.7* 9.9* 11.2* 11.4*  HCT 34.7* 33.8* 27.9* 31.2* 33.0*  MCV 96.1 96.8 96.5 94.8 96.8  PLT 48* 43* 32* 31* 34*     Basic Metabolic Panel: Recent Labs  Lab 01/20/22 1512 01/21/22 1134 01/22/22 0606 01/23/22 0050 01/24/22 0036 01/24/22 1447 01/25/22 0110  NA 133* 132* 134* 135 138  --  137  K 3.7 3.7 3.7 3.4* 3.0* 3.3* 3.3*  CL 94* 100 101 102 100  --  98  CO2 19* 20* 25 20* 24  --  25  GLUCOSE 198* 188* 142* 132* 141*  --  148*  BUN 69* 74* 75* 75* 77*  --  79*  CREATININE 2.50* 2.48* 2.47* 2.37* 2.31*  --  2.31*  CALCIUM 9.4 8.5* 9.0 9.1 9.4  --  9.3  MG 2.1  --   --   --   --   --   --     GFR: Estimated Creatinine Clearance: 33.1 mL/min (A) (by C-G formula based on SCr of 2.31 mg/dL (H)). Recent Labs  Lab 01/20/22 1512 01/21/22 0115 01/21/22 1134 01/22/22 0606 01/23/22 0050 01/24/22 0036 01/25/22 0110  PROCALCITON 0.54 0.51  --  0.41  --   --   --   WBC 12.7*  --    < > 6.4 4.7 5.3 6.2   < > = values in this interval not displayed.     Liver Function Tests: Recent Labs  Lab 01/20/22 1512 01/21/22 1134  AST 110* 98*  ALT 79* 71*  ALKPHOS 117 98  BILITOT 1.8* 1.3*  PROT 7.3 6.5  ALBUMIN 2.7* 2.2*    No results for input(s): "LIPASE", "AMYLASE" in the last 168 hours. No results for input(s): "AMMONIA" in the last 168 hours.  ABG    Component Value Date/Time   TCO2 24 07/13/2010 2236     Coagulation Profile: Recent Labs  Lab 01/21/22 1134 01/22/22 0606 01/23/22 0050 01/24/22 0036 01/25/22 0110  INR 1.8* 1.8* 1.9* 2.0* 1.9*     Cardiac Enzymes: No results for input(s): "CKTOTAL",  "CKMB", "CKMBINDEX", "TROPONINI" in the last 168 hours.  HbA1C: Hgb A1c MFr Bld  Date/Time Value Ref Range Status  01/20/2022 03:03 PM 6.4 (H) 4.8 - 5.6 % Final    Comment:    (NOTE)         Prediabetes: 5.7 - 6.4         Diabetes: >6.4         Glycemic control for adults with diabetes: <7.0   06/15/2021 08:23 AM 6.4 (H) 4.8 - 5.6 % Final    Comment:    (NOTE) Pre diabetes:  5.7%-6.4%  Diabetes:              >6.4%  Glycemic control for   <7.0% adults with diabetes     CBG: Recent Labs  Lab 01/23/22 2112 01/24/22 0614 01/24/22 1643 01/24/22 2129 01/25/22 0557  GLUCAP 122* 133* 150* 155* 147*       Critical care time: na     Georgann Housekeeper, AGACNP-BC [facility]  See Amion for personal pager PCCM on call pager [phone] until 7pm. Please call Elink 7p-7a. [phone]  01/25/2022 11:57 AM

## 2022-01-25 NOTE — Progress Notes (Addendum)
Daily Rounding Note  01/25/2022, 1:26 PM  LOS: 5 days   SUBJECTIVE:   Chief complaint:    new dx cirrhosis w ascites, hepatic hydrothorax.    Ambulating in the hallway but getting tachycardic into the 130s with walking and even just getting up walking to the sink. Feels like his breathing is overall better.  No sense of recurrent abdominal distention.  OBJECTIVE:         Vital signs in last 24 hours:    Temp:  [97.6 F (36.4 C)] 97.6 F (36.4 C) (12/04 0440) Pulse Rate:  [84-108] 84 (12/04 0440) Resp:  [15-19] 18 (12/04 0440) BP: (114-122)/(67-82) 122/82 (12/04 0440) SpO2:  [81 %-95 %] 81 % (12/04 0440) Weight:  [89.8 kg] 89.8 kg (12/04 0450) Last BM Date : 01/23/22 Filed Weights   01/23/22 0532 01/24/22 0525 01/25/22 0450  Weight: 89.7 kg 90.8 kg 89.8 kg   General: looks ill.  Pale.  Weak.     Heart: RRR Chest: Left lung is clear with good breath sounds.  No audible right lung sounds.  No dyspnea at rest.  Some cough after deep inspiration. Abdomen: Soft.  Tenderness in the right lower quadrant.  There is fullness in this area but no visible bruising.  The paracentesis puncture site is covered with a bandage which is not bloody, located in the right upper quadrant Extremities: bil LE edema Neuro/Psych:  pleasant.  Fluid speech.  No confusion.  Pleasant affect.    Intake/Output from previous day: 12/03 0701 - 12/04 0700 In: 823 [P.O.:720; I.V.:3; IV Piggyback:100] Out: 4300 [Urine:4300]  Intake/Output this shift: Total I/O In: 120 [P.O.:120] Out: -   Lab Results: Recent Labs    01/23/22 0050 01/24/22 0036 01/25/22 0110  WBC 4.7 5.3 6.2  HGB 9.9* 11.2* 11.4*  HCT 27.9* 31.2* 33.0*  PLT 32* 31* 34*   BMET Recent Labs    01/23/22 0050 01/24/22 0036 01/24/22 1447 01/25/22 0110  NA 135 138  --  137  K 3.4* 3.0* 3.3* 3.3*  CL 102 100  --  98  CO2 20* 24  --  25  GLUCOSE 132* 141*  --  148*  BUN  75* 77*  --  79*  CREATININE 2.37* 2.31*  --  2.31*  CALCIUM 9.1 9.4  --  9.3   LFT No results for input(s): "PROT", "ALBUMIN", "AST", "ALT", "ALKPHOS", "BILITOT", "BILIDIR", "IBILI" in the last 72 hours. PT/INR Recent Labs    01/24/22 0036 01/25/22 0110  LABPROT 22.6* 21.7*  INR 2.0* 1.9*   Hepatitis Panel No results for input(s): "HEPBSAG", "HCVAB", "HEPAIGM", "HEPBIGM" in the last 72 hours.  Studies/Results: DG CHEST PORT 1 VIEW  Result Date: 01/24/2022 CLINICAL DATA:  Right-sided pleural effusion follow-up. EXAM: PORTABLE CHEST 1 VIEW COMPARISON:  January 23, 2022 FINDINGS: Cardiomediastinal silhouette is stable. No pneumothorax. The left lung is clear. A moderate right pleural effusion with underlying opacity remains. IMPRESSION: A moderate right pleural effusion with underlying opacity remains. No pneumothorax. No other changes. Electronically Signed   By: Dorise Bullion III M.D.   On: 01/24/2022 13:29    MELD 3.0: 24 at 01/23/2022 12:50 AM MELD-Na: 23 at 01/23/2022 12:50 AM  Scheduled Meds:  atorvastatin  80 mg Oral Daily   cholecalciferol  1,000 Units Oral Daily   diltiazem  120 mg Oral Daily   fenofibrate  160 mg Oral Daily   insulin aspart  0-9 Units Subcutaneous TID WC  iron polysaccharides  150 mg Oral Daily   phytonadione  5 mg Oral Daily   potassium chloride  20 mEq Oral Daily   sodium chloride flush  3 mL Intravenous Q12H   torsemide  40 mg Oral Daily   Continuous Infusions: PRN Meds:.acetaminophen **OR** acetaminophen, alum & mag hydroxide-simeth, ondansetron **OR** ondansetron (ZOFRAN) IV    ASSESMENT:     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   12/30/21 Ultrasound at outside hospital showed cirrhosis, small ascites.  Cholelithiasis.  GB wall thickening likely due to ascites. Splenomegaly.  Normal PV dopplers.   ANA negative.  Mitochondrial Ab negative.  IgG elevated to 1654.  IgG normal.  AFP normal.       Hepatic hydrothorax.  2 L thoracentesis on 11/30.  Fluid studies cw transudative source.  Yesterday's x-ray shows moderate right pleural effusion with persistent underlying opacity.    Volume overload.  Acute on chronic Diastolic CHF.  Diuresis >20# thus far.  Demadex, Iv Lasix in place.      Ascites.   12/2 1.3 L paracentesis.  No SBP.        AKI. CKD stage 3b baseline.      01/22/22 EGD: small, single varix at distal esophagus, no indication for banding.  Hypertensive GEJ wo stricture.  PHG, biopsied.  Antral erythem, biopsied, duodenitis, biopsied.  Pathology:  DUODENAL BULB, BIOPSY:  -    Mild active duodenitis with non-specific reactive changes.  -    Negative for pathologic intraepithelial lymphocytosis.  -    Negative for adenomatous change and malignancy.  STOMACH, ANTRUM, BIOPSY:  -    Reactive gastropathy.  -    Negative for an inflammatory pattern predictive of Helicobacter  pylori infection.  -    No intestinal metaplasia or malignancy.  STOMACH, BODY, BIOPSY:  -    Polypoid dilatation of oxyntic gland (fundic gland polyp).  -    Negative for an inflammatory pattern predictive of Helicobacter  pylori infection.  -    No intestinal metaplasia or malignancy.   Hypokalemia.       Thrombocytopenia, splenomegaly.  Dates back to at least April 2023.  Platelets downtrending.      Coagulopathy.  INR 2.0, now 1.9.  daily oral Vit K in place.      Mild [place] anemia.  Low iron, tibc, sats.  On niferex.    PLAN    Continue diuresis.  ? Duration for po Vit K?.      Azucena Freed  01/25/2022, 1:26 PM Phone [phone]   Attending physician's note   I have taken a history, reviewed the chart and examined the patient. I performed a substantive portion of this encounter, including complete performance of at least one of the key components, in conjunction with the APP. I agree with the APP's note, impression and recommendations.    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 The patient agreed with the plan and demonstrated an understanding of the instructions.   Damaris Hippo , MD [phone]

## 2022-01-26 DIAGNOSIS — I5033 Acute on chronic diastolic (congestive) heart failure: Secondary | ICD-10-CM | POA: Diagnosis not present

## 2022-01-26 DIAGNOSIS — I4891 Unspecified atrial fibrillation: Secondary | ICD-10-CM

## 2022-01-26 DIAGNOSIS — Z515 Encounter for palliative care: Secondary | ICD-10-CM | POA: Diagnosis not present

## 2022-01-26 DIAGNOSIS — K769 Liver disease, unspecified: Secondary | ICD-10-CM | POA: Diagnosis not present

## 2022-01-26 LAB — BASIC METABOLIC PANEL
Anion gap: 14 (ref 5–15)
BUN: 80 mg/dL — ABNORMAL HIGH (ref 8–23)
CO2: 29 mmol/L (ref 22–32)
Calcium: 9.7 mg/dL (ref 8.9–10.3)
Chloride: 95 mmol/L — ABNORMAL LOW (ref 98–111)
Creatinine, Ser: 2.31 mg/dL — ABNORMAL HIGH (ref 0.61–1.24)
GFR, Estimated: 29 mL/min — ABNORMAL LOW (ref >60)
Glucose, Bld: 162 mg/dL — ABNORMAL HIGH (ref 70–99)
Potassium: 3.1 mmol/L — ABNORMAL LOW (ref 3.5–5.1)
Sodium: 138 mmol/L (ref 135–145)

## 2022-01-26 LAB — CBC
HCT: 33 % — ABNORMAL LOW (ref 39.0–52.0)
Hemoglobin: 11.8 g/dL — ABNORMAL LOW (ref 13.0–17.0)
MCH: 34 pg (ref 26.0–34.0)
MCHC: 35.8 g/dL (ref 30.0–36.0)
MCV: 95.1 fL (ref 80.0–100.0)
Platelets: 37 10*3/uL — ABNORMAL LOW (ref 150–400)
RBC: 3.47 MIL/uL — ABNORMAL LOW (ref 4.22–5.81)
RDW: 14.4 % (ref 11.5–15.5)
WBC: 6.4 10*3/uL (ref 4.0–10.5)
nRBC: 0 % (ref 0.0–0.2)

## 2022-01-26 LAB — GLUCOSE, CAPILLARY
Glucose-Capillary: 146 mg/dL — ABNORMAL HIGH (ref 70–99)
Glucose-Capillary: 234 mg/dL — ABNORMAL HIGH (ref 70–99)
Glucose-Capillary: 229 mg/dL — ABNORMAL HIGH (ref 70–99)
Glucose-Capillary: 229 mg/dL — ABNORMAL HIGH (ref 70–99)

## 2022-01-26 LAB — CULTURE, BODY FLUID W GRAM STAIN -BOTTLE: Culture: NO GROWTH

## 2022-01-26 LAB — PROTIME-INR
INR: 1.8 — ABNORMAL HIGH (ref 0.8–1.2)
Prothrombin Time: 20.3 seconds — ABNORMAL HIGH (ref 11.4–15.2)

## 2022-01-26 LAB — POTASSIUM: Potassium: 3.6 mmol/L (ref 3.5–5.1)

## 2022-01-26 LAB — PATHOLOGIST SMEAR REVIEW

## 2022-01-26 MED ORDER — POTASSIUM CHLORIDE CRYS ER 20 MEQ PO TBCR
40.0000 meq | EXTENDED_RELEASE_TABLET | ORAL | Status: AC
  Administered 2022-01-26 (×2): 40 meq via ORAL
  Filled 2022-01-26: qty 2

## 2022-01-26 MED ORDER — POTASSIUM CHLORIDE 20 MEQ PO PACK
40.0000 meq | PACK | Freq: Two times a day (BID) | ORAL | Status: DC
  Filled 2022-01-26: qty 2

## 2022-01-26 MED ORDER — POTASSIUM CHLORIDE 20 MEQ PO PACK: 40.0000 meq | PACK | Freq: Two times a day (BID) | ORAL | Status: DC

## 2022-01-26 MED ORDER — POTASSIUM CHLORIDE CRYS ER 20 MEQ PO TBCR: 40.0000 meq | EXTENDED_RELEASE_TABLET | Freq: Two times a day (BID) | ORAL | Status: DC

## 2022-01-26 NOTE — Progress Notes (Signed)
PROGRESS NOTE SCOUT GUMBS  RSW:[id number] DOB: 02/17/1951 DOA: 01/20/2022 PCP: Christain Sacramento, MD   Brief Narrative/Hospital Course: 71 year old male with history of CHF with preserved EF, class I obesity, atrial fibrillation, CKD stage IIIb recent baseline creatinine 21.6 in April, chronic anemia hemoglobin 10 g at baseline, chronic thrombocytopenia platelets 70 K in April, Diabetes last HbA1c 6.4 in April  and OSA on CPAP, recently diagnosed hepatic cirrhosis with portal hypertension,  Recently admitted in [facility]  and discharged 12/29/2021 and after getting treatment for pneumonia and right pleural effusion apparently had a chest tube that put out total of 8-10 L , sent from Dr. Irven Shelling office as a direct admission for worsening shortness of breath and leg edema and fluid overload, he had recently increased torsemide to 20 twice daily 11/28. 11/29:Patient was admitted 11/30:underwent IR thoracentesis with 2 L fluid removal, ascitic albumin < 1.5-unable to calculate SAAG pe GI.Gram stain culture no growth 12/2: Paracentesis 1.3 L fluid removed  12/4: Mobilizing in the hallway with PT OT but is weak and easily gets short of breath  Subjective:  Seen and examined this morning off oxygen, still comes of shortness of breath activity feels very weak   Assessment and Plan: Acute on chronic diastolic CHF Fluid overload due to liver cirrhosis hypoalbuminemia: Presented with shortness of breath multifactorial due to liver cirrhosis/acute on chronic diastolic CHF.  Recent admission at [facility] needing chest tube, outpatient Lasix and torsemide did not help.  Appreciate nephrology input for diuretic management, on IV Lasix 80 mg  bid from12/2> switched to torsemide 40 mg po daily 12/4 , S/P metolazone x1> admission weight 222.4 pound improved to 196.2 on 12/5.Cont to monitor intake output continue fluid restricted and low-salt diet Net IO Since Admission: -11,930.23 mL [01/26/22 1300]  Filed  Weights   01/24/22 0525 01/25/22 0450 01/26/22 0525  Weight: 90.8 kg 89.8 kg 89 kg     Pneumonia: On admission presented with leukocytosis and elevated procalcitonin>Imaging with compressive atelectasis in the setting of pleural effusion.  Completed cefttraixonex5 days and azithromycin x3 days.  Afebrile, leukocytosis resolved from 12.7k   Recurrent right Rt pleural effusion-hepatic hydrothorax: W/ complete collapse of the RML,ubtotal collapse of  RUL w/ mediastinal shift to the left from pleural effusion.S/p US-guided thoracentesis  11/30 w/ 2l hazy yellow fluid removed, transudative fluid :  cytology negative for malignancy,Gram stain culture no growth,LDH 30, ascitic albumin < 1.5- so unable to calculate SAAG. Managed with high-dose Lasix intermittent albumin/metolazone per nephrology along with low-salt diet fluid restriction. Cxr 12/2-unchanged large right pleural effusion: Pulmonary advised that chest tube will not help/contraindicated-continue medical management with diuretics, palliative care follow-up and repeat thoracentesis for symptomatic relief.  AKI on Stage 3a chronic kidney disease (CKD): Likely multifactorial-baseline creatinine was 1.6- 1.8 recently.  Creatinine on admission around 2.4 holding overall stable tolerating diuretics.  Continue per nephrology.   Recent Labs  Lab 01/22/22 0606 01/23/22 0050 01/24/22 0036 01/25/22 0110 01/26/22 0133  BUN 75* 75* 77* 79* 80*  CREATININE 2.47* 2.37* 2.31* 2.31* 2.31*   Intake/Output Summary (Last 24 hours) at 01/26/2022 1300 Last data filed at 01/26/2022 1223 Gross per 24 hour  Intake 120 ml  Output 2500 ml  Net -2380 ml   Hypokalemia: With ongoing low potassium being repleted aggressively with 40 M EQ x2 and 20 mEq daily  Recent Labs  Lab 01/23/22 0050 01/24/22 0036 01/24/22 1447 01/25/22 0110 01/26/22 0133  K 3.4* 3.0* 3.3* 3.3* 3.1*  Decompensated liver cirrhosis-presumed MASLD Moderate volume  ascitesC Coagulopathy Normocytic anemia Thrombocytopenia Portal hypertensive gastropathy: GI following work-up  Palliative care was also involved. 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 Patient to continue high-protein diet, avoid artificial sweeteners soda fluids juice or candy avoid alcohol. MELD NA 23 on 01/23/22.Has decompensated liver cirrhosis with ascites coagulopathy thrombocytopenia anemia. 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  Cytology negative on  Paracentesis 12/2 with 1.3 L of peritoneal fluid removed-total nucleated cell 106 with neutrophil 10, Gram stain no organism . Continue bVitamin K, monitor INR and platelet count as below-with significant thrombocytopenia anemia.  Recent Labs  Lab 01/22/22 0606 01/23/22 0050 01/24/22 0036 01/25/22 0110 01/26/22 0133  INR 1.8* 1.9* 2.0* 1.9* 1.8*   Recent Labs  Lab 01/22/22 0606 01/23/22 0050 01/24/22 0036 01/25/22 0110 01/26/22 0133  PLT 43* 32* 31* 34* 37*  Demand ischemia with elevated but flat troponin :in the setting of patient's congestive heart failure.  Seen by Neurology.No chest pain.  Chronic atrial fibrillation: Rate controlled on Cardizem 120 mg, not on anticoagulation PTA. Dr. Einar Gip has been following and discussed and holding anticoagulation due to severe thrombocytopenia.  Some tachycardia expected on ambulation but monitor> May need to increase Cardizem if persistent  Diabetes mellitus type 2,:controlled on SSI. Continue to hold home glipizide.  Trend as below Recent Labs  Lab 01/20/22 1503 01/20/22 1646 01/25/22 1145 01/25/22 1635 01/25/22 2138 01/26/22 0614 01/26/22 1117  GLUCAP  --    < > 209* 175* 176* 146* 234*  HGBA1C 6.4*  --   --   --   --   --   --    < > = values  in this interval not displayed.  Essential hypertension: Stable, continue po cardizem.  Hyperlipidemia: on fenofibrates Obstructive sleep apnea: cont CPAP bedtime Class 1 obesity Body mass index is 31.91 kg/m.Will benefit with PCP follow-up, weight loss  healthy lifestyle and cont cpap Deconditioning/debility continue PT OT still complains of being weak. Goals of care: Palliative care has been involved, overall approach does not appear bright in the setting of hepatic hydrothorax with recurrent pleural effusion decompensated liver cirrhosis with CKD  and CHF. Pressure injury left buttock POA Pressure Injury 01/20/22 Buttocks Left Stage 2 -  Partial thickness loss of dermis presenting as a shallow open injury with a red, pink wound bed without slough. round 1 cm left buttock (Active)  01/20/22 1550  Location: Buttocks  Location Orientation: Left  Staging: Stage 2 -  Partial thickness loss of dermis presenting as a shallow open injury with a red, pink wound bed without slough.  Wound Description (Comments): round 1 cm left buttock  Present on Admission:   Dressing Type Foam - Lift dressing to assess site every shift 01/20/22 1927  DVT prophylaxis: SCDs Start: 01/20/22 1524 holding off on anticoagulation due to thrombocytopenia Code Status:   Code Status: Full Code Family Communication: plan of care discussed with patient at bedside.  Patient status XB:[id number] because of acute on chronic CHF pleural effusion cirrhosis ascites Level of care: Telemetry Cardiac  Dispo: The patient is from: home            Anticipated disposition: Likely home in 2 to 3 days if signed off by pulmonary/nephrology.  If continues to remain weak question CIR versus SNF otherwise home health  Objective: Vitals last 24 hrs: Vitals:   01/25/22 1955 01/26/22 0525 01/26/22 0528 01/26/22 0919  BP: (!) 129/90 121/70  (!) 127/90  Pulse: (!) 50 90    Resp: 18 18 18 18   Temp: (!) 97.5 F (36.4 C) 97.8 F (36.6 C)   98.3 F (36.8 C)  TempSrc: Oral Oral  Oral  SpO2: 93% 93%  95%  Weight:  89 kg    Height:       Weight change: -0.816 kg  Physical Examination: General exam: AAox3, weak,older appearing HEENT:Oral mucosa moist, Ear/Nose WNL grossly, dentition normal. Respiratory system: Absent breath sounds on the right lung, left lung clear  Cardiovascular system: S1 & S2 +, regular rate. Gastrointestinal system: Abdomen soft, NT,mildly distended,BS+ Nervous System:Alert, awake, moving extremities and grossly nonfocal Extremities: LE ankle edema +, lower extremities warm Skin: No rashes,no icterus. MSK: Normal muscle bulk,tone, power     Medications reviewed:  Scheduled Meds:  atorvastatin  80 mg Oral Daily   cholecalciferol  1,000 Units Oral Daily   diltiazem  120 mg Oral Daily   fenofibrate  160 mg Oral Daily   insulin aspart  0-9 Units Subcutaneous TID WC   iron polysaccharides  150 mg Oral Daily   phytonadione  5 mg Oral Daily   potassium chloride  40 mEq Oral BID   sodium chloride flush  3 mL Intravenous Q12H   torsemide  40 mg Oral Daily   Continuous Infusions:   Diet Order             Diet 2 gram sodium Room service appropriate? Yes; Fluid consistency: Thin; Fluid restriction: 1500 mL Fluid  Diet effective now                  Intake/Output Summary (Last 24 hours) at 01/26/2022 1300 Last data filed at 01/26/2022 1223 Gross per 24 hour  Intake 120 ml  Output 2500 ml  Net -2380 ml   Net IO Since Admission: -11,930.23 mL [01/26/22 1300]  Wt Readings from Last 3 Encounters:  01/26/22 89 kg  01/19/22 102.9 kg  11/19/21 101.2 kg     Unresulted Labs (From admission, onward)     Start     Ordered   01/21/22 1733  Sodium, urine, random  Once,   R        01/21/22 1732   01/21/22 1056  Protime-INR  Daily,   R     Question:  Specimen collection method  Answer:  IV Team=IV Team collect   01/21/22 1055   01/20/22 1656  Legionella Pneumophila Serogp 1 Ur Ag  Once,   R         01/20/22 1656   01/20/22 1656  Strep pneumoniae urinary antigen  Once,   R        01/20/22 1656   01/20/22 1656  Expectorated Sputum Assessment w Gram Stain, Rflx to Resp Cult  Once,   R        01/20/22 1656          Data Reviewed: I have personally reviewed following labs and imaging studies CBC: Recent Labs  Lab 01/22/22 0606 01/23/22 0050 01/24/22 0036 01/25/22 0110 01/26/22 0133  WBC 6.4 4.7 5.3 6.2 6.4  HGB 11.7* 9.9* 11.2* 11.4* 11.8*  HCT 33.8* 27.9* 31.2* 33.0* 33.0*  MCV 96.8 96.5 94.8 96.8 95.1  PLT 43* 32* 31* 34* 37*   Basic Metabolic Panel: Recent Labs  Lab 01/20/22 1512 01/21/22 1134 01/22/22 0606 01/23/22 0050  01/24/22 0036 01/24/22 1447 01/25/22 0110 01/26/22 0133  NA 133*   < > 134* 135 138  --  137 138  K 3.7   < > 3.7 3.4* 3.0* 3.3* 3.3* 3.1*  CL 94*   < > 101 102 100  --  98 95*  CO2 19*   < > 25 20* 24  --  25 29  GLUCOSE 198*   < > 142* 132* 141*  --  148* 162*  BUN 69*   < > 75* 75* 77*  --  79* 80*  CREATININE 2.50*   < > 2.47* 2.37* 2.31*  --  2.31* 2.31*  CALCIUM 9.4   < > 9.0 9.1 9.4  --  9.3 9.7  MG 2.1  --   --   --   --   --   --   --    < > = values in this interval not displayed.   GFR: Estimated Creatinine Clearance: 32.9 mL/min (A) (by C-G formula based on SCr of 2.31 mg/dL (H)). Liver Function Tests: Recent Labs  Lab 01/20/22 1512 01/21/22 1134  AST 110* 98*  ALT 79* 71*  ALKPHOS 117 98  BILITOT 1.8* 1.3*  PROT 7.3 6.5  ALBUMIN 2.7* 2.2*  HbA1C: No results for input(s): "HGBA1C" in the last 72 hours.  Recent Results (from the past 240 hour(s))  Gram stain     Status: None   Collection Time: 01/21/22  8:36 AM   Specimen: Lung, Right; Pleural Fluid  Result Value Ref Range Status   Specimen Description PLEURAL  Final   Special Requests NONE  Final   Gram Stain   Final    FEW WBC SEEN NO ORGANISMS SEEN Performed at [facility], [address], [place], [place] [address]    Report Status 01/21/2022  FINAL  Final  Culture, body fluid w Gram Stain-bottle     Status: None   Collection Time: 01/21/22  8:36 AM   Specimen: Pleura  Result Value Ref Range Status   Specimen Description PLEURAL  Final   Special Requests NONE  Final   Culture   Final    NO GROWTH 5 DAYS Performed at [facility] [address], [place], [place] [address]    Report Status 01/26/2022 FINAL  Final  Culture, body fluid w Gram Stain-bottle     Status: None (Preliminary result)   Collection Time: 01/23/22 12:12 PM   Specimen: Fluid  Result Value Ref Range Status   Specimen Description FLUID  Final   Special Requests  PERITONEAL  Final   Culture   Final    NO GROWTH 3 DAYS Performed at [facility], [place] [address] [place], [place] [address]    Report Status PENDING  Incomplete  Gram stain     Status: None   Collection Time: 01/23/22 12:12 PM   Specimen: Fluid  Result Value Ref Range Status   Specimen Description FLUID  Final   Special Requests  PERITONEAL  Final   Gram Stain   Final    CYTOSPIN SMEAR WBC PRESENT, PREDOMINANTLY PMN NO ORGANISMS SEEN Performed at [facility], [place] [address], [place],  [address]    Report Status 01/23/2022 FINAL  Final    Antimicrobials: Anti-infectives (From admission, onward)    Start     Dose/Rate Route Frequency Ordered Stop   01/20/22 1745  cefTRIAXone (ROCEPHIN) 2 g in sodium chloride 0.9 % 100 mL IVPB  2 g 200 mL/hr over 30 Minutes Intravenous Every 24 hours 01/20/22 1656 01/24/22 1756   01/20/22 1745  azithromycin (ZITHROMAX) tablet 500 mg        500 mg Oral Daily 01/20/22 1656 01/25/22 0959      Culture/Microbiology    Component Value Date/Time   SDES FLUID 01/23/2022 1212   SDES FLUID 01/23/2022 1212   SPECREQUEST  PERITONEAL 01/23/2022 1212   SPECREQUEST  PERITONEAL 01/23/2022 1212   CULT  01/23/2022 1212    NO GROWTH 3 DAYS Performed at [facility], [place] [address], [place], [place] [address]     REPTSTATUS PENDING 01/23/2022 1212   REPTSTATUS 01/23/2022 FINAL 01/23/2022 1212    Other culture-see note  Radiology Studies: No results found.   LOS: 6 days   Antonieta Pert, MD [facility]  01/26/2022, 1:00 PM

## 2022-01-26 NOTE — Progress Notes (Signed)
Palliative Care Progress Note, Assessment & Plan   Patient Name: Dale [name]       Date: 01/26/2022 DOB: 09/12/1950  Age: 71 y.o. MRN#: [id number] Attending Physician: Antonieta Pert, MD Primary Care Physician: Christain Sacramento, MD Admit Date: 01/20/2022  Reason for Consultation/Follow-up: Establishing goals of care  Subjective: Patient is sitting in chair/OOB with legs elevated. He acknowledges my presence and is able to make his wishes known. His daughter and wife are at bedside. Patient has no acute complaints at this time.  HPI: 71 y.o. male  with past medical history of HFpEF, class I obesity, A-fib, CKD 3, chronic anemia, chronic thrombocytopenia with platelets 70 K, diabetes A1c 6.4 April, OSA on CPAP, recently diagnosed hepatic cirrhosis with portal hypertension admitted on 01/20/2022 with acute on chronic diastolic heart failure, asymmetric right pleural effusion, liver cirrhosis with abdominal distention.   Summary of counseling/coordination of care: After reviewing the patient's chart and assessing the patient at bedside, I spoke with patient and family present in regards to diagnosis, prognosis, and GOC.  Symptom management: Patient has no acute complaints. He still feels weak and unable to "get up and go" like he once did. Discussions of functional ability and getting to/from toiler fort urination, which has increased during hospitalization.   Patient and family are concerned that he is going to be discharged today and he is not ready. Patient states he wants to get stronger and back to his baseline "or better". WE discussed that attending and TOC are following closely to ensure patient has a safe discharge plan.   Therapeutic silence and active listening provided for patient to share his  thoughts and emotions regarding current medical situation.  Emotional support provided.  After my visit, I counseled with Dr. Lupita Leash who confirmed that patient is not discharging today. We also discussed that patient is not medically ready (hypokalemia and potential for additional thoracentesis). I conveyed this information to patient's wife [name], who was appreciative of the update.   Full code and full scope remain. PMT will continue to follow/support patient and family.   Physical Exam Vitals reviewed.  Constitutional:      Appearance: Normal appearance.  HENT:     Head: Normocephalic.     Mouth/Throat:     Mouth: Mucous membranes are moist.  Eyes:     Pupils: Pupils are equal, round, and reactive to light.  Cardiovascular:     Rate and Rhythm: Tachycardia present.     Pulses: Normal pulses.  Pulmonary:     Effort: Pulmonary effort is normal.  Abdominal:     Palpations: Abdomen is soft.  Musculoskeletal:     Comments: MAETC  Neurological:     Mental Status: He is alert and oriented to person, place, and time.  Psychiatric:        Mood and Affect: Mood normal.        Behavior: Behavior normal.        Thought Content: Thought content normal.        Judgment: Judgment normal.             Palliative Assessment/Data: 60%    Total Time 35 minutes  Greater than 50%  of  this time was spent counseling and coordinating care related to the above assessment and plan.  Thank you for allowing the Palliative Medicine Team to assist in the care of this patient.  Ulen Ilsa Iha, FNP-BC Palliative Medicine Team Team Phone # [phone]

## 2022-01-26 NOTE — Progress Notes (Signed)
Patient placed on nasal CPAP and tolerating well at this time.

## 2022-01-26 NOTE — Plan of Care (Signed)

## 2022-01-26 NOTE — Progress Notes (Signed)
01/26/22 1000  Mobility  Activity Ambulated with assistance in hallway  Level of Assistance Contact guard assist, steadying assist  Assistive Device Four wheel walker  Distance Ambulated (ft) 40 ft  Activity Response Tolerated well  Mobility Referral Yes  $Mobility charge 1 Mobility   Mobility Specialist Progress Note  Pre-Mobility: 95 HR During Mobility: 110-136 HR, 92% SpO2 Post-Mobility:  106  HR, 94% SpO2  Pt was in bed and agreeable. X1 seated break d/t fatigue. X1 standing break d/t High HR. Returned to chair w/all needs met and call bell in reach.   Kyla Jones Mobility Specialist  Please contact via SecureChat or Rehab office at [phone]  

## 2022-01-26 NOTE — Progress Notes (Signed)
[facility] Progress Note   Subjective:  He had at least 1.9  liters UOP-  weight down a little more-  BUN and crt stable.     improving globally -  trying to walk with PT-  " I am not strong enough to go  home"    71 y.o. male with  chronic diastolic CHF, chronic kidney disease stage 3b, diabetes mellitus, atrial fibrillation, gout, hypertension, and OSA who presented with swelling and shortness of breath.    [place] be able to return home.  He had a thoracentesis with 2 liters removed.  Note that he has a new diagnosis of cirrhosis.   Note that his Lasix was changed to torsemide recently.  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  GI, pulmonary, nephrology, and palliative care have been consulted.  he follows at [facility] with Dr. Royce Macadamia; last saw him 09/21/21.     Intake/Output Summary (Last 24 hours) at 01/26/2022 1011 Last data filed at 01/26/2022 0500 Gross per 24 hour  Intake 120 ml  Output 1900 ml  Net -1780 ml    Vitals:  Vitals:   01/25/22 1955 01/26/22 0525 01/26/22 0528 01/26/22 0919  BP: (!) 129/90 121/70  (!) 127/90  Pulse: (!) 50 90    Resp: 18 18 18 18   Temp: (!) 97.5 F (36.4 C) 97.8 F (36.6 C)  98.3 F (36.8 C)  TempSrc: Oral Oral  Oral  SpO2: 93% 93%  95%  Weight:  89 kg    Height:         Physical Exam:    General:  adult male in bed in NAD  HEENT: NCAT Eyes: sclera anicteric Neck: supple trachea midline  Heart: S1S2 no rub Lungs: right lung with pleural rub, reduced breath sounds; left lung clear to auscultation; unlabored   Abdomen: softly distended/nontender Extremities: 2+ edema lower extremities Neuro: reduced hearing; alert and oriented x3; provides hx and follows commands Psych normal mood and affect GU no foley   Medications reviewed   Labs:     Latest Ref Rng & Units 01/26/2022     1:33 AM 01/25/2022    1:10 AM 01/24/2022    2:47 PM  BMP  Glucose 70 - 99 mg/dL 162  148    BUN 8 - 23 mg/dL 80  79    Creatinine 0.61 - 1.24 mg/dL 2.31  2.31    Sodium 135 - 145 mmol/L 138  137    Potassium 3.5 - 5.1 mmol/L 3.1  3.3  3.3   Chloride 98 - 111 mmol/L 95  98    CO2 22 - 32 mmol/L 29  25    Calcium 8.9 - 10.3 mg/dL 9.7  9.3       Assessment/Plan:   # AKI  - pre-renal insults with large volume paracenteses and new diagnosis of cirrhosis.  Slightly over his kidney function baseline but very overloaded.   will be difficult to diurese as patient with hypoalbuminemia    - optimize volume status -  down over 20 pounds-  kidney function staying pretty stable - s/p albumin  - Strict ins/outs and daily weights    # Acute on Chronic diastolic CHF  - weight down significantly with thoracentesis, paracentesis, and diuretics -  try torsemide 40 mg daily starting 12/4-  seems to be adequate -  Note that as an outpatient he had been transitioned to torsemide but this was a day or two prior to admission - agree with the change.  May need higher dose and/or metolazone PRN as well -  will need to fine tune as OP    # Cirrhosis - with ascites - new diagnosis  - s/p paracentesis with IR on 12/2 - 1.3 liters off - Discussed salt and fluid restriction    # Hypokalemia - got 40 meq K on 12/4. Follow daily-  needs at least that-  will give 40 bid for next 2 days-  also can be fine tuned as OP    # Macrocytic anemia  - Iron deficiency - start oral iron daily-  hgb over 11  # CKD stage 3b - Follows at [facility] with Dr. Royce Macadamia  - Baseline Cr trends as above.  Cr 1.6 - 2.0    # HTN  - Acceptable control   Possibly approaching d/c -  just weak-  not sure if will need temp placement -  I will cont to fine tune diuretic and potassium while he is here      Louis Meckel, MD 01/26/2022 10:11 AM

## 2022-01-26 NOTE — Progress Notes (Signed)
Dale [name],  The biopsies from your duodenum showed mild inflammatory changes (duodenitis), most likely related to irritation from stomach acid.  Given the absence of symptoms (abdominal pain, nausea), I don't recommend any medical therapy for this finding. The biopsies taken from your stomach were notable for mild reactive gastropathy which is a common finding and often related to use of certain medications (usually NSAIDs), but there was no evidence of Helicobacter pylori infection. This common finding is not felt to necessarily be a cause of any particular symptom and there is no specific treatment or further evaluation recommended.  Please follow up with me in the office to further discuss management of your cirrhosis.

## 2022-01-27 DIAGNOSIS — K769 Liver disease, unspecified: Secondary | ICD-10-CM | POA: Diagnosis not present

## 2022-01-27 DIAGNOSIS — I5033 Acute on chronic diastolic (congestive) heart failure: Secondary | ICD-10-CM | POA: Diagnosis not present

## 2022-01-27 DIAGNOSIS — I8510 Secondary esophageal varices without bleeding: Secondary | ICD-10-CM

## 2022-01-27 DIAGNOSIS — K7460 Unspecified cirrhosis of liver: Secondary | ICD-10-CM | POA: Diagnosis not present

## 2022-01-27 DIAGNOSIS — R188 Other ascites: Secondary | ICD-10-CM | POA: Diagnosis not present

## 2022-01-27 DIAGNOSIS — Z789 Other specified health status: Secondary | ICD-10-CM

## 2022-01-27 LAB — CBC
HCT: 34.1 % — ABNORMAL LOW (ref 39.0–52.0)
Hemoglobin: 12.2 g/dL — ABNORMAL LOW (ref 13.0–17.0)
MCH: 33.8 pg (ref 26.0–34.0)
MCHC: 35.8 g/dL (ref 30.0–36.0)
MCV: 94.5 fL (ref 80.0–100.0)
Platelets: 36 10*3/uL — ABNORMAL LOW (ref 150–400)
RBC: 3.61 MIL/uL — ABNORMAL LOW (ref 4.22–5.81)
RDW: 14.1 % (ref 11.5–15.5)
WBC: 7.9 10*3/uL (ref 4.0–10.5)
nRBC: 0 % (ref 0.0–0.2)

## 2022-01-27 LAB — GLUCOSE, CAPILLARY
Glucose-Capillary: 166 mg/dL — ABNORMAL HIGH (ref 70–99)
Glucose-Capillary: 276 mg/dL — ABNORMAL HIGH (ref 70–99)
Glucose-Capillary: 184 mg/dL — ABNORMAL HIGH (ref 70–99)
Glucose-Capillary: 181 mg/dL — ABNORMAL HIGH (ref 70–99)

## 2022-01-27 LAB — BASIC METABOLIC PANEL
Anion gap: 15 (ref 5–15)
BUN: 81 mg/dL — ABNORMAL HIGH (ref 8–23)
CO2: 26 mmol/L (ref 22–32)
Calcium: 9.8 mg/dL (ref 8.9–10.3)
Chloride: 96 mmol/L — ABNORMAL LOW (ref 98–111)
Creatinine, Ser: 2.22 mg/dL — ABNORMAL HIGH (ref 0.61–1.24)
GFR, Estimated: 31 mL/min — ABNORMAL LOW (ref >60)
Glucose, Bld: 180 mg/dL — ABNORMAL HIGH (ref 70–99)
Potassium: 3.9 mmol/L (ref 3.5–5.1)
Sodium: 137 mmol/L (ref 135–145)

## 2022-01-27 LAB — PROTIME-INR
INR: 1.5 — ABNORMAL HIGH (ref 0.8–1.2)
Prothrombin Time: 18.1 seconds — ABNORMAL HIGH (ref 11.4–15.2)

## 2022-01-27 MED ORDER — POTASSIUM CHLORIDE 20 MEQ PO PACK
20.0000 meq | PACK | Freq: Every day | ORAL | Status: DC
  Administered 2022-01-27 – 2022-01-28 (×2): 20 meq via ORAL
  Filled 2022-01-27 (×2): qty 1

## 2022-01-27 NOTE — Care Management Important Message (Signed)
Important Message  Patient Details  Name: Dale [name] MRN: [id number] Date of Birth: [birth year]   Medicare Important Message Given:  Yes     Shelda Altes 01/27/2022, 11:10 AM

## 2022-01-27 NOTE — Plan of Care (Signed)

## 2022-01-27 NOTE — Progress Notes (Signed)
Pt placed on CPAP for night rest tolerating well.

## 2022-01-27 NOTE — Progress Notes (Signed)
[facility] Progress Note   Subjective:  He had 2 liters UOP-  weight down a little more-  BUN and crt stable.     improving globally -  trying to walk with PT-  aside from intermittent inc in HR is doing well    71 y.o. male with  chronic diastolic CHF, chronic kidney disease stage 3b, diabetes mellitus, atrial fibrillation, gout, hypertension, and OSA who presented with swelling and shortness of breath.    [place] to be able to return home.  He had a thoracentesis with 2 liters removed.  new diagnosis of cirrhosis.   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  GI, pulmonary, nephrology, and palliative care have been consulted.  he follows at [facility] with Dr. Royce Macadamia; last saw him 09/21/21.     Intake/Output Summary (Last 24 hours) at 01/27/2022 0919 Last data filed at 01/27/2022 0500 Gross per 24 hour  Intake 240 ml  Output 2000 ml  Net -1760 ml    Vitals:  Vitals:   01/27/22 0024 01/27/22 0401 01/27/22 0500 01/27/22 0635  BP:    113/77  Pulse:    90  Resp:    19  Temp:    (!) 97.5 F (36.4 C)  TempSrc:    Oral  SpO2: 98% 99%  92%  Weight:   88.9 kg 88.1 kg  Height:         Physical Exam:    General:  adult male in bed in NAD  HEENT: NCAT Eyes: sclera anicteric Neck: supple trachea midline  Heart: S1S2 no rub Lungs: right lung with pleural rub, reduced breath sounds; left lung clear to auscultation; unlabored   Abdomen: softly distended/nontender Extremities: 1+ edema lower extremities-  is better  Neuro: reduced hearing; alert and oriented x3; provides hx and follows commands Psych normal mood and affect GU no foley   Medications reviewed   Labs:     Latest Ref Rng & Units 01/27/2022    1:07 AM 01/26/2022    2:38 PM 01/26/2022    1:33 AM  BMP  Glucose 70 - 99 mg/dL 180   162   BUN 8 - 23 mg/dL 81   80   Creatinine 0.61 - 1.24  mg/dL 2.22   2.31   Sodium 135 - 145 mmol/L 137   138   Potassium 3.5 - 5.1 mmol/L 3.9  3.6  3.1   Chloride 98 - 111 mmol/L 96   95   CO2 22 - 32 mmol/L 26   29   Calcium 8.9 - 10.3 mg/dL 9.8   9.7      Assessment/Plan:   # AKI  - pre-renal insults with large volume paracenteses and new diagnosis of cirrhosis.  Slightly worse than his kidney function baseline but not much - very overloaded.    - optimize volume status -  down over 20 pounds-  kidney function staying pretty stable - s/p albumin  - Strict ins/outs and daily weights    # Acute on Chronic diastolic CHF  - weight down significantly with thoracentesis, paracentesis, and diuretics -  try torsemide 40 mg daily starting 12/4-  seems to be adequate so far  -  will need to fine tune as OP    # Cirrhosis - with ascites - new diagnosis  - s/p paracentesis with IR on 12/2 - 1.3 liters off -  Discussed salt and fluid restriction    # Hypokalemia - been getting a lot-  now on 20 per day maintenance, seems OK for now-  also can be fine tuned as OP    # Macrocytic anemia  - Iron deficiency - start oral iron daily-  hgb over 11  # CKD stage 3b - Follows at [facility] with Dr. Royce Macadamia  - Baseline Cr trends as above.  Cr 1.6 - 2.0    # HTN  - Acceptable control -   on cardizem for rate control -  would really not want to increase or add another agent as thet might hinder his diuresis  Possibly approaching d/c -  just weak-  not sure if will need temp placement - I am going to sign off I think meds as is are appropriate-  I will make sure he has appropriate follow up with Dr. Royce Macadamia.  Call with questions    Louis Meckel, MD 01/27/2022 9:19 AM

## 2022-01-27 NOTE — Progress Notes (Signed)
Progress Note   Patient: Dale [name] DOB: [birth year] DOA: 01/20/2022     7 DOS: the patient was seen and examined on 01/27/2022   Brief hospital course: 71 year old man complicated PMH, recently diagnosed with cirrhosis with portal hypertension, recent admission in [facility] treated with pneumonia and chest tube who was admitted for volume overload, underwent thoracentesis with 2 L fluid removal as well as paracentesis.  Condition improving, plan for home health in the next 24-48 hours. 11/29:Patient was admitted 11/30:underwent IR thoracentesis with 2 L fluid removal, ascitic albumin < 1.5-unable to calculate SAAG pe GI.Gram stain culture no growth 12/2: Paracentesis 1.3 L fluid removed 12/4: Mobilizing in the hallway with PT OT but is weak and easily gets short of breath   Assessment and Plan: Acute on chronic diastolic CHF, fluid overload due to liver cirrhosis hypoalbuminemia: Overall improved.  Now on oral diuretic.  Nephrology has signed off.    Decompensated liver cirrhosis-presumed MASLD Moderate volume ascitesC Coagulopathy Normocytic anemia Thrombocytopenia Portal hypertensive gastropathy: Appears to have stabilized at this point.  Plan for outpatient follow-up with GI.    Pneumonia  Appears resolved.  Completed course of antibiotics.   Recurrent right Rt pleural effusion-hepatic hydrothorax: W/ complete collapse of the RML,ubtotal collapse of  RUL w/ mediastinal shift to the left from pleural effusion.S/p US-guided thoracentesis. Pulmonary advised that chest tube will not help/contraindicated Continue medical management with diuretic, palliative care follow-up and repeat thoracentesis for symptomatic relief.   AKI on Stage 3a chronic kidney disease (CKD): Likely multifactorial-baseline creatinine was 1.6- 1.8 recently. Per nephrology thought to be prerenal insults with large-volume paracentesis and new diagnosis of cirrhosis. Follow-up with Dr. Royce Macadamia  as an outpatient  Hypokalemia Resolved  Demand ischemia with elevated but flat troponin  In the setting of CHF.  No further evaluation suggested.   Chronic atrial fibrillation  Rate controlled on Cardizem 120 mg, not on anticoagulation PTA. Dr. Einar Gip discussed holding anticoagulation due to severe thrombocytopenia.     Diabetes mellitus type 2 controlled on SSI. Continue to hold home glipizide.    Essential hypertension: Stable, continue po cardizem.   Obstructive sleep apnea: cont CPAP bedtime  Class 1 obesity Body mass index is 31.91 kg/m.Will benefit with PCP follow-up, weight loss  healthy lifestyle and cont cpap Deconditioning/debility continue PT OT still complains of being weak.     Subjective:  Feels better but still winded when moving and HR goes up Doesn't feel strong enough yet  Physical Exam: Vitals:   01/27/22 0024 01/27/22 0401 01/27/22 0500 01/27/22 0635  BP:    113/77  Pulse:    90  Resp:    19  Temp:    (!) 97.5 F (36.4 C)  TempSrc:    Oral  SpO2: 98% 99%  92%  Weight:   88.9 kg 88.1 kg  Height:       Physical Exam Vitals reviewed.  Constitutional:      General: He is not in acute distress.    Appearance: He is not ill-appearing or toxic-appearing.  Cardiovascular:     Rate and Rhythm: Normal rate and regular rhythm.     Heart sounds: No murmur heard. Pulmonary:     Effort: Pulmonary effort is normal. No respiratory distress.     Breath sounds: No wheezing, rhonchi or rales.  Musculoskeletal:     Right lower leg: No edema.     Left lower leg: No edema.  Neurological:     Mental Status: He  is alert.  Psychiatric:        Mood and Affect: Mood normal.        Behavior: Behavior normal.     Data Reviewed: Creatinine stable 2.22 Hgb stable 12.2  Family Communication: none  Disposition: Status is: Inpatient Remains inpatient appropriate because: cirrhosis, likely home 0.4-48 hours.  Planned Discharge Destination: Home with Home  Health    Time spent: 25 minutes  Author: Murray Hodgkins, MD 01/27/2022 7:47 PM  For on call review [email or web address].

## 2022-01-27 NOTE — Progress Notes (Signed)
Occupational Therapy Treatment Patient Details Name: Dale [name] MRN: [id number] DOB: 04-Jan-1951 Today's Date: 01/27/2022   History of present illness 71 y.o. male admitted with acute on chronic diastolic heart failure, asymmetric right pleural effusion, liver cirrhosis with abdominal distention. He had 4.9 liters UOP over 12/2.  Paracentesis on 12/2 with 1.3 liters off. Pt with past medical history of HFpEF, class I obesity, A-fib, CKD 3, chronic anemia, chronic thrombocytopenia with platelets 70 K, diabetes A1c 6.4 April, OSA on CPAP, recently diagnosed hepatic cirrhosis with portal hypertension   OT comments  Anton is making great progress. He completed toileting, hygiene, grooming, and bathing at the sink with RW and min G - superivsion A. Energy conservation strategies educated and used throughout. Pt voiced concern of his HR at the end of the session, HR to 120s with activity and 90s at rest. OT to continue to follow acutely. POC remains appropriate.    Recommendations for follow up therapy are one component of a multi-disciplinary discharge planning process, led by the attending physician.  Recommendations may be updated based on patient status, additional functional criteria and insurance authorization.    Follow Up Recommendations  Home health OT     Assistance Recommended at Discharge Frequent or constant Supervision/Assistance  Patient can return home with the following  A little help with walking and/or transfers;A little help with bathing/dressing/bathroom;Assistance with cooking/housework   Equipment Recommendations  Other (comment)       Precautions / Restrictions Precautions Precautions: Fall Precaution Comments: monitor HR Restrictions Weight Bearing Restrictions: No       Mobility Bed Mobility Overal bed mobility: Needs Assistance             General bed mobility comments: OOB upon arrival    Transfers Overall transfer level: Needs  assistance Equipment used: Rolling walker (2 wheels) Transfers: Sit to/from Stand Sit to Stand: Min guard           General transfer comment: from various surfaces     Balance Overall balance assessment: Needs assistance Sitting-balance support: Feet supported Sitting balance-Leahy Scale: Good       Standing balance-Leahy Scale: Fair Standing balance comment: ADLs at the sink                           ADL either performed or assessed with clinical judgement   ADL Overall ADL's : Needs assistance/impaired     Grooming: Set up;Sitting   Upper Body Bathing: Supervision/ safety;Sitting   Lower Body Bathing: Min guard;Sit to/from stand Lower Body Bathing Details (indicate cue type and reason): at the sink Upper Body Dressing : Set up;Sitting       Toilet Transfer: Min guard;Ambulation;BSC/3in1;Rolling walker (2 wheels)   Toileting- Clothing Manipulation and Hygiene: Min guard;Sit to/from stand Toileting - Clothing Manipulation Details (indicate cue type and reason): standing     Functional mobility during ADLs: Min guard;Rolling walker (2 wheels) General ADL Comments: HR to 120s with activity    Extremity/Trunk Assessment Upper Extremity Assessment Upper Extremity Assessment: Generalized weakness   Lower Extremity Assessment Lower Extremity Assessment: Generalized weakness        Vision   Vision Assessment?: No apparent visual deficits   Perception Perception Perception: Not tested   Praxis Praxis Praxis: Intact    Cognition Arousal/Alertness: Awake/alert Behavior During Therapy: WFL for tasks assessed/performed Overall Cognitive Status: Within Functional Limits for tasks assessed  General Comments: pt reports he had family visitors today that lifted his spirits              General Comments Pt appreciative ofADLs    Pertinent Vitals/ Pain       Pain Assessment Pain Assessment:  Faces Faces Pain Scale: Hurts a little bit Pain Location: generalized Pain Descriptors / Indicators: Discomfort Pain Intervention(s): Limited activity within patient's tolerance, Monitored during session   Frequency  Min 2X/week        Progress Toward Goals  OT Goals(current goals can now be found in the care plan section)  Progress towards OT goals: Progressing toward goals  Acute Rehab OT Goals Patient Stated Goal: home OT Goal Formulation: With patient Time For Goal Achievement: 02/07/22 Potential to Achieve Goals: Good ADL Goals Pt Will Perform Grooming: with modified independence;standing Pt Will Perform Lower Body Dressing: with supervision;sit to/from stand Pt Will Transfer to Toilet: with supervision;ambulating Additional ADL Goal #1: Pt will complete at least 10 minutes of OOB funcitonal activity to demosntrated increased activity tolerance  Plan Discharge plan remains appropriate       AM-PAC OT "6 Clicks" Daily Activity     Outcome Measure   Help from another person eating meals?: None Help from another person taking care of personal grooming?: A Little Help from another person toileting, which includes using toliet, bedpan, or urinal?: A Little Help from another person bathing (including washing, rinsing, drying)?: A Little Help from another person to put on and taking off regular upper body clothing?: A Little Help from another person to put on and taking off regular lower body clothing?: A Little 6 Click Score: 19    End of Session Equipment Utilized During Treatment: Rolling walker (2 wheels)  OT Visit Diagnosis: Unsteadiness on feet (R26.81);Other abnormalities of gait and mobility (R26.89);Muscle weakness (generalized) (M62.81)   Activity Tolerance Patient tolerated treatment well   Patient Left with call bell/phone within reach;in chair;with chair alarm set   Nurse Communication Mobility status        Time: 2595-6387 OT Time Calculation  (min): 34 min  Charges: OT General Charges $OT Visit: 1 Visit OT Treatments $Self Care/Home Management : 23-37 mins   Elliot Cousin 01/27/2022, 4:42 PM

## 2022-01-27 NOTE — Progress Notes (Signed)
Physical Therapy Treatment Patient Details Name: Dale [name] MRN: [id number] DOB: [birth year] Today's Date: 01/27/2022   History of Present Illness 71 y.o. male admitted with acute on chronic diastolic heart failure, asymmetric right pleural effusion, liver cirrhosis with abdominal distention. He had 4.9 liters UOP over 12/2.  Paracentesis on 12/2 with 1.3 liters off. Pt with past medical history of HFpEF, class I obesity, A-fib, CKD 3, chronic anemia, chronic thrombocytopenia with platelets 70 K, diabetes A1c 6.4 April, OSA on CPAP, recently diagnosed hepatic cirrhosis with portal hypertension    PT Comments    Pt supine in bed HoB elevated, however slid down, reporting it feels like morning meds are "stuck". Provided pt minA for coming to EoB to improved swallow. Pt reports feels like they have gone. Pt comes to standing and HR elevates to high 140s, static stand for ~2 min until HR decreases to 110s. Ambulated around foot of bed to recliner HR elevated to 130s and pt reports SoB. With sitting HR returns to 90s abke ti perform seated exercise before elevating LE. Initiated conversation about energy conservation given increased fatigue with mobility. D/c plans remain appropriate. PT will continue to follow acutely.     Recommendations for follow up therapy are one component of a multi-disciplinary discharge planning process, led by the attending physician.  Recommendations may be updated based on patient status, additional functional criteria and insurance authorization.  Follow Up Recommendations  Home health PT     Assistance Recommended at Discharge Intermittent Supervision/Assistance  Patient can return home with the following A little help with walking and/or transfers;A little help with bathing/dressing/bathroom;Assistance with cooking/housework;Assist for transportation;Help with stairs or ramp for entrance   Equipment Recommendations  None recommended by PT       Precautions /  Restrictions Precautions Precautions: Fall Precaution Comments: monitor HR Restrictions Weight Bearing Restrictions: No Other Position/Activity Restrictions: watch lines     Mobility  Bed Mobility Overal bed mobility: Needs Assistance Bed Mobility: Supine to Sit     Supine to sit: Min assist     General bed mobility comments: pt able to manage LE to EoB, requires min A for bringin trunk to upright, pt with use of bed rail to pull to EoB    Transfers Overall transfer level: Needs assistance Equipment used: Rollator (4 wheels) Transfers: Sit to/from Stand Sit to Stand: Min assist           General transfer comment: good power up, light min A for steadying in standing at Rollator, with coming to standing HR increased to high 140s and requires ~2 min in standing to come back to 110s,    Ambulation/Gait Ambulation/Gait assistance: Min guard Gait Distance (Feet): 18 Feet Assistive device: Rolling walker (2 wheels) Gait Pattern/deviations: Step-through pattern, Decreased step length - right, Decreased step length - left, Shuffle Gait velocity: reduced Gait velocity interpretation: <1.31 ft/sec, indicative of household ambulator   General Gait Details: min guard and increased cuing for navigation around obstacles in room to ambulate to recliner on other side of bed, HR elevated again to 130s with short distance ambulation         Balance Overall balance assessment: Needs assistance Sitting-balance support: Feet supported Sitting balance-Leahy Scale: Good       Standing balance-Leahy Scale: Fair Standing balance comment: less than fair dynamically                            Cognition Arousal/Alertness:  Awake/alert Behavior During Therapy: WFL for tasks assessed/performed Overall Cognitive Status: Within Functional Limits for tasks assessed                                          Exercises General Exercises - Lower Extremity Hip  Flexion/Marching: AROM, 10 reps, Seated    General Comments General comments (skin integrity, edema, etc.): Pt limited by increased HR to 130-140s and associated SoB with activity, SpO2 >90%O2, HR returns to 90s with sitting up in recliner      Pertinent Vitals/Pain Pain Assessment Pain Assessment: No/denies pain     PT Goals (current goals can now be found in the care plan section) Acute Rehab PT Goals Patient Stated Goal: to get home soon PT Goal Formulation: With patient/family Time For Goal Achievement: 02/08/22 Potential to Achieve Goals: Good Progress towards PT goals: Not progressing toward goals - comment (limited by elevation in HR)    Frequency    Min 3X/week      PT Plan Current plan remains appropriate       AM-PAC PT "6 Clicks" Mobility   Outcome Measure  Help needed turning from your back to your side while in a flat bed without using bedrails?: A Little Help needed moving from lying on your back to sitting on the side of a flat bed without using bedrails?: A Little Help needed moving to and from a bed to a chair (including a wheelchair)?: A Little Help needed standing up from a chair using your arms (e.g., wheelchair or bedside chair)?: A Little Help needed to walk in hospital room?: A Little Help needed climbing 3-5 steps with a railing? : A Lot 6 Click Score: 17    End of Session Equipment Utilized During Treatment: Gait belt Activity Tolerance: Patient limited by fatigue;Other (comment) (increased HR) Patient left: with call bell/phone within reach;in chair;with family/visitor present Nurse Communication: Mobility status PT Visit Diagnosis: Unsteadiness on feet (R26.81);Muscle weakness (generalized) (M62.81);Difficulty in walking, not elsewhere classified (R26.2)     Time: 3267-1245 PT Time Calculation (min) (ACUTE ONLY): 21 min  Charges:  $Therapeutic Exercise: 8-22 mins                     Nekeya Briski B. Migdalia Dk PT, DPT Acute  Rehabilitation Services Please use secure chat or  Call Office [phone]    [place] 01/27/2022, 9:48 AM

## 2022-01-27 NOTE — Progress Notes (Signed)
Palliative Care Progress Note, Assessment & Plan   Patient Name: Dale [name]       Date: 01/27/2022 DOB: 1950-07-05  Age: 71 y.o. MRN#: [id number] Attending Physician: Samuella Cota, MD Primary Care Physician: Christain Sacramento, MD Admit Date: 01/20/2022  Reason for Consultation/Follow-up: Establishing goals of care  Subjective: Patient is sitting in chair with legs elevated. He acknowledges my presence and is able to make his wishes known. He has no acute complaints. His wife is at bedside.   HPI: 71 y.o. male  with past medical history of HFpEF, class I obesity, A-fib, CKD 3, chronic anemia, chronic thrombocytopenia with platelets 70 K, diabetes A1c 6.4 April, OSA on CPAP, and recently diagnosed hepatic cirrhosis with portal hypertension admitted on 01/20/2022 with acute on chronic diastolic heart failure, asymmetric right pleural effusion, and liver cirrhosis with abdominal distention.    Summary of counseling/coordination of care: After reviewing the patient's chart and assessing the patient at bedside, we discussed plan and goals of care.   Patient shares he is relieved that he is not going home today. He endorses neuropathy in his feet has made it challenging to walk. We discussed PT's recommendations to go home with [facility] He is in agreement, saying he will do whatever his doctors and wife tell him to do.   Advanced directives and code status discussed. Patient was clear and wife in agreement that he is accepting of all offered, appropriate, and available medical interventions to save his life. He would be accepting of artificial life support but would not want to live on it long term. He says that if he is unable to live off of machines after two weeks then he would want them removed and allow a  natural death.   Therapeutic silence and active listening provided for patient to share his thoughts and emotions regarding current medical situation.  Emotional support provided.  PMT will continue to follow the patient throughout his hospitalization. Goals are clear. Full code and full scope remain.  Physical Exam Constitutional:      Appearance: Normal appearance.  HENT:     Head: Normocephalic.     Mouth/Throat:     Mouth: Mucous membranes are moist.  Eyes:     Pupils: Pupils are equal, round, and reactive to light.  Cardiovascular:     Rate and Rhythm: Normal rate.     Pulses: Normal pulses.  Pulmonary:     Effort: Pulmonary effort is normal.  Abdominal:     Palpations: Abdomen is soft.  Musculoskeletal:        General: Normal range of motion.     Comments: MAETC, generalized weakness  Neurological:     Mental Status: He is alert and oriented to person, place, and time.  Psychiatric:        Mood and Affect: Mood normal.        Behavior: Behavior normal.        Thought Content: Thought content normal.        Judgment: Judgment normal.             Palliative Assessment/Data: 60%    Total Time 35 minutes  Greater than 50%  of this  time was spent counseling and coordinating care related to the above assessment and plan.  Thank you for allowing the Palliative Medicine Team to assist in the care of this patient.  Courtdale Ilsa Iha, FNP-BC Palliative Medicine Team Team Phone # [phone]

## 2022-01-28 DIAGNOSIS — N184 Chronic kidney disease, stage 4 (severe): Secondary | ICD-10-CM | POA: Diagnosis not present

## 2022-01-28 DIAGNOSIS — I8510 Secondary esophageal varices without bleeding: Secondary | ICD-10-CM | POA: Diagnosis not present

## 2022-01-28 DIAGNOSIS — I5033 Acute on chronic diastolic (congestive) heart failure: Secondary | ICD-10-CM | POA: Diagnosis not present

## 2022-01-28 DIAGNOSIS — E1122 Type 2 diabetes mellitus with diabetic chronic kidney disease: Secondary | ICD-10-CM | POA: Diagnosis not present

## 2022-01-28 DIAGNOSIS — K7460 Unspecified cirrhosis of liver: Secondary | ICD-10-CM | POA: Diagnosis not present

## 2022-01-28 DIAGNOSIS — K769 Liver disease, unspecified: Secondary | ICD-10-CM | POA: Diagnosis not present

## 2022-01-28 DIAGNOSIS — I4891 Unspecified atrial fibrillation: Secondary | ICD-10-CM | POA: Diagnosis not present

## 2022-01-28 LAB — BASIC METABOLIC PANEL
Anion gap: 14 (ref 5–15)
BUN: 80 mg/dL — ABNORMAL HIGH (ref 8–23)
CO2: 29 mmol/L (ref 22–32)
Calcium: 9.3 mg/dL (ref 8.9–10.3)
Chloride: 91 mmol/L — ABNORMAL LOW (ref 98–111)
Creatinine, Ser: 2.31 mg/dL — ABNORMAL HIGH (ref 0.61–1.24)
GFR, Estimated: 29 mL/min — ABNORMAL LOW (ref >60)
Glucose, Bld: 226 mg/dL — ABNORMAL HIGH (ref 70–99)
Potassium: 3.1 mmol/L — ABNORMAL LOW (ref 3.5–5.1)
Sodium: 134 mmol/L — ABNORMAL LOW (ref 135–145)

## 2022-01-28 LAB — CULTURE, BODY FLUID W GRAM STAIN -BOTTLE: Culture: NO GROWTH

## 2022-01-28 LAB — GLUCOSE, CAPILLARY
Glucose-Capillary: 159 mg/dL — ABNORMAL HIGH (ref 70–99)
Glucose-Capillary: 282 mg/dL — ABNORMAL HIGH (ref 70–99)
Glucose-Capillary: 288 mg/dL — ABNORMAL HIGH (ref 70–99)
Glucose-Capillary: 211 mg/dL — ABNORMAL HIGH (ref 70–99)

## 2022-01-28 LAB — PROTIME-INR
INR: 1.7 — ABNORMAL HIGH (ref 0.8–1.2)
Prothrombin Time: 20.2 seconds — ABNORMAL HIGH (ref 11.4–15.2)

## 2022-01-28 NOTE — Progress Notes (Signed)
   01/28/22 1200  Mobility  Activity Transferred to/from [facility];Transferred from bed to chair  Level of Assistance Standby assist, set-up cues, supervision of patient - no hands on  Assistive Device Front wheel walker  Distance Ambulated (ft) 2 ft  Activity Response Tolerated well  Mobility Referral Yes  $Mobility charge 1 Mobility   Mobility Specialist Progress Note  Pre-Mobility: 95 HR During Mobility: 127 HR Post-Mobility: 98 HR  Pt was in bed and agreeable. Had no c/o pain. Returned to chair w/ all needs met and call bell in reach.   Lucious Groves Mobility Specialist  Please contact via SecureChat or Rehab office at [phone]

## 2022-01-28 NOTE — Progress Notes (Signed)
Palliative Care Progress Note, Assessment & Plan   Patient Name: Dale [name]       Date: 01/28/2022 DOB: 1950/03/11  Age: 71 y.o. MRN#: [id number] Attending Physician: Samuella Cota, MD Primary Care Physician: Christain [place], MD Admit Date: 01/20/2022  Reason for Consultation/Follow-up: Establishing goals of care  Subjective: Patient is sitting up in bed in no apparent distress.  He acknowledges my presence and is able to make his wishes known.  He has no acute complaints at this time.  He notes that his belly is slightly larger than yesterday and that he is up 2 pounds in weight.  His wife and girlfriend are at bedside.  HPI: 71 y.o. male  with past medical history of HFpEF, class I obesity, A-fib, CKD 3, chronic anemia, chronic thrombocytopenia with platelets 70 K, diabetes A1c 6.4 April, OSA on CPAP, and recently diagnosed hepatic cirrhosis with portal hypertension admitted on 01/20/2022 with acute on chronic diastolic heart failure, asymmetric right pleural effusion, and liver cirrhosis with abdominal distention.    Summary of counseling/coordination of care: After reviewing the patient's chart and assessing the patient at bedside, we discussed plan of care and prognosis.  I attempted to elicit wishes and values important to the patient.  We discussed goals of care.  Patient shares that he will do what ever his doctors tell him.  However, he remains concerned that he is so weak and is concerned about returning home. He shares his heart rate "jumps up" whenever he is up and exercising. We discussed his mobility and stamina will be a gradual improvement.  His wife inquires about patient being transferred to a rehab facility-specifically the [facility] inpatient patient rehab.  Patient shares that he is  open to attending rehab prior to returning home.  I discussed that palliative medicine will make these inquiries known to medical team and that [facility] is following closely. I reviewed that recommendations from PT/OT as well as insurance and medical appropriateness will guide disposition.   We discussed chronic, and irreversible nature of patient's liver cirrhosis and likely need for continued thoracentesis for symptom relief. Patient states he would really like to never have to come back to the hospital if possible. His goal is to be at home and to get thoracentesis outpatient as needed.    I am not sure who has discussed or suggested a pleurx drain with patient previously but he also inquired if he can have a drain placed, allowing him to be home and cut down on outpatient doctor's visits.  We discussed that patient is not currently a candidate for a Pleurx drain as he is not needing daily removal of fluid. Risks and benefits of pleurx briefly addressed and that it maye be an option for him in the future.  Patient and wife remain in agreement for full code and full scope.  TOC made aware of patient and wife's inquiry for SNF rehab.   Family made aware that PMT will shadow the patient's chart and monitor him peripherally. Please re-engage with PMTif goals change, at patient/family's request, or if patient's health deteriorates during hospitalization.   Physical Exam Vitals reviewed.  Constitutional:      Appearance: Normal  appearance.  HENT:     Head: Normocephalic.     Mouth/Throat:     Mouth: Mucous membranes are moist.  Eyes:     Pupils: Pupils are equal, round, and reactive to light.  Pulmonary:     Effort: Pulmonary effort is normal.  Abdominal:     General: There is distension.     Palpations: There is no mass.     Tenderness: There is no abdominal tenderness. There is no guarding.  Musculoskeletal:     Comments: MAETC, generalized weakness  Neurological:     Mental Status: He is  alert and oriented to person, place, and time.  Psychiatric:        Mood and Affect: Mood normal.        Behavior: Behavior normal.        Thought Content: Thought content normal.        Judgment: Judgment normal.             Palliative Assessment/Data: 60%    Total Time 50 minutes  Greater than 50%  of this time was spent counseling and coordinating care related to the above assessment and plan.  Thank you for allowing the Palliative Medicine Team to assist in the care of this patient.  Gully Ilsa Iha, FNP-BC Palliative Medicine Team Team Phone # [phone]

## 2022-01-28 NOTE — Progress Notes (Signed)
Progress Note   Patient: Dale [name] WYO:[id number] DOB: [birth year] DOA: 01/20/2022     8 DOS: the patient was seen and examined on 01/28/2022   Brief hospital course: 71 year old man complicated PMH, recently diagnosed with cirrhosis with portal hypertension, recent admission in [facility] treated with pneumonia and chest tube who was admitted for volume overload, underwent thoracentesis with 2 L fluid removal as well as paracentesis.  Condition improving, plan for home health in the next 24-48 hours. 11/29:Patient was admitted 11/30:underwent IR thoracentesis with 2 L fluid removal, ascitic albumin < 1.5-unable to calculate SAAG pe GI.Gram stain culture no growth 12/2: Paracentesis 1.3 L fluid removed 12/4: Mobilizing in the hallway with PT OT but is weak and easily gets short of breath   Consultants IR Pulmonology Gastroenterology  Cardiology  Nephrology   Procedures Successful image-guided right thoracentesis. Yielded 2 L of hazy yellow fluid. 12/1 EGD 12/2 paracentesis 1.3L  Assessment and Plan: Acute on chronic diastolic CHF, fluid overload due to liver cirrhosis hypoalbuminemia: Overall improved.  Continue oral diuretic.  Nephrology has signed off.    Persistent atrial fibrillation  Rate controlled on Cardizem 120 mg, not on anticoagulation PTA. Dr. Einar Gip discussed holding anticoagulation due to severe thrombocytopenia.   Given difficulty controlling heart rate limiting mobility will reconsult cardiology for assistance with rate control during ambulation.   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  Plan for outpatient follow-up with GI.   Avoid artificial sweeteners, soda, fruit juice or candy. Avoid alcohol   Pneumonia  Appears resolved.  Completed course of antibiotics.   Recurrent  right Rt pleural effusion-hepatic hydrothorax: W/ complete collapse of the RML, subtotal collapse of  RUL w/ mediastinal shift to the left from pleural effusion. S/p US-guided thoracentesis. Pulmonary advised that chest tube will not help/contraindicated Continue medical management with diuretic, palliative care follow-up and repeat thoracentesis for symptomatic relief.   AKI on Stage 3a chronic kidney disease (CKD): Likely multifactorial-baseline creatinine was 1.6- 1.8 recently. Stable.  Per nephrology thought to be prerenal insults with large-volume paracentesis and new diagnosis of cirrhosis. Follow-up with Dr. Royce Macadamia as an outpatient   Hypokalemia Replete.   Demand ischemia with elevated but flat troponin  In the setting of CHF.  No further evaluation suggested.   Diabetes mellitus type 2 controlled on SSI. Continue to hold home glipizide.    Essential hypertension: Stable, continue po cardizem.    Obstructive sleep apnea: cont CPAP bedtime   Class 1 obesity Body mass index is 31.91 kg/m.Will benefit with PCP follow-up, weight loss  healthy lifestyle and cont cpap Deconditioning/debility continue PT OT still complains of being weak.       Subjective:  Feels ok sitting, but HR shoots up with movement. Breathing ok. Doesn't feel able to go home Reports staff limits his activity secondary to movement-induced tachycardia.  Physical Exam: Vitals:   01/27/22 2049 01/27/22 2318 01/28/22 0646 01/28/22 0742  BP: 100/87 100/87    Pulse: (!) 30 76    Resp: 18 (!) 22  18  Temp: 97.6 F (36.4 C)     TempSrc: Oral     SpO2: (!) 83% 96%  97%  Weight:   89 kg   Height:       Physical Exam Vitals and nursing note reviewed.  Constitutional:      General: He is not in acute distress.    Appearance:  He is not ill-appearing or toxic-appearing.  Cardiovascular:     Rate and Rhythm: Normal rate. Rhythm irregular.     Heart sounds: No murmur heard. Pulmonary:     Effort: No  respiratory distress.     Breath sounds: No wheezing, rhonchi or rales.  Neurological:     Mental Status: He is alert.  Psychiatric:        Mood and Affect: Mood normal.        Behavior: Behavior normal.     Data Reviewed: CBG stable K+ 3.1 Creatinine stable 2.31  Family Communication: wife, multiple family members at bedside  Disposition: Status is: Inpatient Remains inpatient appropriate because: afib RVR w/ movement  Planned Discharge Destination: Home with Home Health    Time spent: 35 minutes  Author: Murray Hodgkins, MD 01/28/2022 6:02 PM  For on call review [email or web address].

## 2022-01-28 NOTE — TOC Progression Note (Signed)
Transition of Care [facility]) - Progression Note    Patient Details  Name: Dale [name] MRN: [id number] Date of Birth: 1950/11/21  Transition of Care [facility]) CM/SW [place], [place] Phone Number: 01/28/2022, 3:55 PM  Clinical Narrative:     CSW is informed by PMT that pt and wife were expressing interest in a rehab facility and specifically [facility]. Current PT recs are for [facility].   CSW met with pt, pt spouse, and pt's daughter bedside to discuss disposition. CSW explained current PT recs are for [facility] but they are interested in [facility]. If [facility] cannot accept pt, pt and family do not want SNF and would prefer pt to return home with [facility]. Pt lives at home with his spouse in [place]. They have family nearby in [place] and Daughter lives in [place].   CSW explained follow up recs would come from PT/OT. CSW contacted PT from pt's last therapy session and is informed pt is unlikely to be able to tolerate 3hours of therapy/day. Pt is scheduled with PT tomorrow. TOC will follow to assist with disposition.   Expected Discharge Plan: [place] Barriers to Discharge: Continued Medical Work up  Expected Discharge Plan and Services Expected Discharge Plan: [place] arrangements for the past 2 months: Single Family Home                                       Social Determinants of Health (SDOH) Interventions    Readmission Risk Interventions     No data to display

## 2022-01-28 NOTE — Progress Notes (Signed)
Pt placed on CPAP for night rest. 

## 2022-01-29 DIAGNOSIS — J90 Pleural effusion, not elsewhere classified: Secondary | ICD-10-CM

## 2022-01-29 DIAGNOSIS — K7210 Chronic hepatic failure without coma: Secondary | ICD-10-CM

## 2022-01-29 DIAGNOSIS — K7581 Nonalcoholic steatohepatitis (NASH): Secondary | ICD-10-CM

## 2022-01-29 LAB — GLUCOSE, CAPILLARY
Glucose-Capillary: 159 mg/dL — ABNORMAL HIGH (ref 70–99)
Glucose-Capillary: 198 mg/dL — ABNORMAL HIGH (ref 70–99)
Glucose-Capillary: 177 mg/dL — ABNORMAL HIGH (ref 70–99)
Glucose-Capillary: 153 mg/dL — ABNORMAL HIGH (ref 70–99)

## 2022-01-29 LAB — BASIC METABOLIC PANEL
Anion gap: 13 (ref 5–15)
BUN: 83 mg/dL — ABNORMAL HIGH (ref 8–23)
CO2: 28 mmol/L (ref 22–32)
Calcium: 9.2 mg/dL (ref 8.9–10.3)
Chloride: 93 mmol/L — ABNORMAL LOW (ref 98–111)
Creatinine, Ser: 2.22 mg/dL — ABNORMAL HIGH (ref 0.61–1.24)
GFR, Estimated: 31 mL/min — ABNORMAL LOW (ref >60)
Glucose, Bld: 159 mg/dL — ABNORMAL HIGH (ref 70–99)
Potassium: 3.1 mmol/L — ABNORMAL LOW (ref 3.5–5.1)
Sodium: 134 mmol/L — ABNORMAL LOW (ref 135–145)

## 2022-01-29 LAB — PROTIME-INR
INR: 1.7 — ABNORMAL HIGH (ref 0.8–1.2)
Prothrombin Time: 20.1 seconds — ABNORMAL HIGH (ref 11.4–15.2)

## 2022-01-29 MED ORDER — DIGOXIN 125 MCG PO TABS
0.0625 mg | ORAL_TABLET | ORAL | Status: DC
  Administered 2022-01-30: 0.0625 mg via ORAL
  Filled 2022-01-29: qty 1

## 2022-01-29 MED ORDER — POTASSIUM CHLORIDE 20 MEQ PO PACK
40.0000 meq | PACK | Freq: Every day | ORAL | Status: DC
  Administered 2022-01-30 – 2022-01-31 (×2): 40 meq via ORAL
  Filled 2022-01-29 (×2): qty 2

## 2022-01-29 MED ORDER — METOPROLOL TARTRATE 25 MG PO TABS
25.0000 mg | ORAL_TABLET | Freq: Two times a day (BID) | ORAL | Status: DC
  Administered 2022-01-29 – 2022-01-31 (×5): 25 mg via ORAL
  Filled 2022-01-29 (×5): qty 1

## 2022-01-29 MED ORDER — DIGOXIN 125 MCG PO TABS
0.2500 mg | ORAL_TABLET | ORAL | Status: AC
  Administered 2022-01-29 (×2): 0.25 mg via ORAL
  Filled 2022-01-29 (×2): qty 2

## 2022-01-29 MED ORDER — POTASSIUM CHLORIDE 20 MEQ PO PACK
40.0000 meq | PACK | ORAL | Status: AC
  Administered 2022-01-29 (×2): 40 meq via ORAL
  Filled 2022-01-29 (×2): qty 2

## 2022-01-29 NOTE — Progress Notes (Signed)
Subjective:  Reconsulted due to episodes of atrial fibrillation with rapid ventricular response even with minimal activity.  Patient continues to have shortness of breath even at rest and worse with minimal exertion activity.  Intake/Output from previous day:  I/O last 3 completed shifts: In: -  Out: 1100 [Urine:1100] No intake/output data recorded. Net IO Since Admission: -14,190.23 mL [01/29/22 1000]  Blood pressure 120/83, pulse 88, temperature (!) 97.4 F (36.3 C), temperature source Oral, resp. rate 20, height _0  (1.778 m), weight 89.9 kg, SpO2 91 %.  Physical Exam Constitutional:      General: He is in acute distress (mild).     Appearance: He is well-developed. He is ill-appearing.  Neck:     Vascular: JVD present. No carotid bruit.  Cardiovascular:     Rate and Rhythm: Normal rate. Rhythm irregular.     Pulses: Intact distal pulses.          Dorsalis pedis pulses are 1+ on the right side and 1+ on the left side.       Posterior tibial pulses are 1+ on the right side and 1+ on the left side.     Heart sounds: Heart sounds are distant. No murmur heard.    No gallop.  Pulmonary:     Effort: Tachypnea (mild) and accessory muscle usage present.     Breath sounds: Examination of the right-upper field reveals decreased breath sounds. Examination of the right-middle field reveals decreased breath sounds. Examination of the right-lower field reveals decreased breath sounds. Decreased breath sounds present.  Abdominal:     General: Bowel sounds are normal. There is distension.     Palpations: Abdomen is soft.     Comments: Obese abdomen  Musculoskeletal:     Right lower leg: Edema (2+ ankle) present.     Left lower leg: Edema (2+ ankle) present.    Lab Results: Lab Results  Component Value Date   NA 134 (L) 01/29/2022   K 3.1 (L) 01/29/2022   CO2 28 01/29/2022   GLUCOSE 159 (H) 01/29/2022   BUN 83 (H) 01/29/2022   CREATININE 2.22 (H) 01/29/2022   CALCIUM 9.2  01/29/2022   EGFR 29 (L) 10/31/2020   GFRNONAA 31 (L) 01/29/2022    BNP (last 3 results) Recent Labs    01/20/22 1512  BNP 130.1*       Latest Ref Rng & Units 01/29/2022    1:03 AM 01/28/2022    8:26 AM 01/27/2022    1:07 AM  BMP  Glucose 70 - 99 mg/dL 159  226  180   BUN 8 - 23 mg/dL 83  80  81   Creatinine 0.61 - 1.24 mg/dL 2.22  2.31  2.22   Sodium 135 - 145 mmol/L 134  134  137   Potassium 3.5 - 5.1 mmol/L 3.1  3.1  3.9   Chloride 98 - 111 mmol/L 93  91  96   CO2 22 - 32 mmol/L _1 Calcium 8.9 - 10.3 mg/dL 9.2  9.3  9.8       Latest Ref Rng & Units 01/21/2022   11:34 AM 01/20/2022    3:12 PM  Hepatic Function  Total Protein 6.5 - 8.1 g/dL 6.5  7.3   Albumin 3.5 - 5.0 g/dL 2.2  2.7   AST 15 - 41 U/L 98  110   ALT 0 - 44 U/L 71  79   Alk Phosphatase 38 - 126 U/L 98  117   Total Bilirubin 0.3 - 1.2 mg/dL 1.3  1.8   Bilirubin, Direct 0.0 - 0.2 mg/dL 0.5        Latest Ref Rng & Units 01/27/2022    1:07 AM 01/26/2022    1:33 AM 01/25/2022    1:10 AM  CBC  WBC 4.0 - 10.5 K/uL 7.9  6.4  6.2   Hemoglobin 13.0 - 17.0 g/dL 12.2  11.8  11.4   Hematocrit 39.0 - 52.0 % 34.1  33.0  33.0   Platelets 150 - 400 K/uL 36  37  34    Lab Results  Component Value Date   INR 1.7 (H) 01/29/2022   INR 1.7 (H) 01/28/2022   INR 1.5 (H) 01/27/2022    HEMOGLOBIN A1C Lab Results  Component Value Date   HGBA1C 6.4 (H) 01/20/2022   MPG 137 01/20/2022   TSH Recent Labs    01/20/22 1512  TSH 0.790   Imaging: Imaging results have been reviewed   Cardiac Studies:    PCV ECHOCARDIOGRAM COMPLETE 11/04/2020   Narrative Echocardiogram 11/04/2020: Left ventricle cavity is normal in size and wall thickness. Normal global wall motion. Normal LV systolic function with EF 59%. Normal diastolic filling pattern. Mild (Grade I) aortic regurgitation. Mild (Grade I) mitral regurgitation. Previous study on 08/15/2020 reported EF 45-50%, mod MR, mild TR.     PCV MYOCARDIAL  PERFUSION WITH LEXISCAN 11/03/2020 Lexiscan nuclear stress test performed using 1-day protocol. Small area of mildly decreased tracer uptake in apical inferior myocardium, likely due to gut attenuation. All segments of left ventricle demonstrated normal wall motion and thickening. Stress LVEF 81%. Low risk study.   Echocardiogram 01/03/2022: Normal LV size, mild LVH, normal wall motion.  EF 60 to 65%. Normal RV size and function. Mild mitral and calcification.  Tricuspid valve is normal.  No pulm hypertension.   EKG:   EKG 01/19/2022: Atrial fibrillation with controlled ventricular response at a rate of 93 bpm, left axis deviation, left anterior fascicular block. Poor R wave progression, cannot exclude anteroseptal infarct old. IVCD, incomplete left bundle branch block. Low voltage complexes, consider pulmonary disease pattern. Compared to 11/19/2021, atrial fibrillation has replaced sinus rhythm.   No results found for this or any previous visit (from the past 43800 hour(s)).  Scheduled Meds:  atorvastatin  80 mg Oral Daily   cholecalciferol  1,000 Units Oral Daily   [START ON 01/30/2022] digoxin  0.0625 mg Oral QODAY   digoxin  0.25 mg Oral Q4H   diltiazem  120 mg Oral Daily   fenofibrate  160 mg Oral Daily   insulin aspart  0-9 Units Subcutaneous TID WC   iron polysaccharides  150 mg Oral Daily   metoprolol tartrate  25 mg Oral BID   phytonadione  5 mg Oral Daily   [START ON 01/30/2022] potassium chloride  40 mEq Oral Daily   potassium chloride  40 mEq Oral Q4H   sodium chloride flush  3 mL Intravenous Q12H   torsemide  40 mg Oral Daily   Continuous Infusions:   PRN Meds:.acetaminophen **OR** acetaminophen, alum & mag hydroxide-simeth, ondansetron **OR** ondansetron (ZOFRAN) IV  Cardiac Studies:    PCV ECHOCARDIOGRAM COMPLETE 11/04/2020   Narrative Echocardiogram 11/04/2020: Left ventricle cavity is normal in size and wall thickness. Normal global wall motion. Normal LV  systolic function with EF 59%. Normal diastolic filling pattern. Mild (Grade I) aortic regurgitation. Mild (Grade I) mitral regurgitation. Previous study on 08/15/2020 reported EF 45-50%, mod MR, mild  TR.     PCV MYOCARDIAL PERFUSION WITH LEXISCAN 11/03/2020 Lexiscan nuclear stress test performed using 1-day protocol. Small area of mildly decreased tracer uptake in apical inferior myocardium, likely due to gut attenuation. All segments of left ventricle demonstrated normal wall motion and thickening. Stress LVEF 81%. Low risk study.   Echocardiogram 01/03/2022: Normal LV size, mild LVH, normal wall motion.  EF 60 to 65%. Normal RV size and function. Mild mitral and calcification.  Tricuspid valve is normal.  No pulm hypertension.   EKG:  Tele: Persistent atrial fibrillation.  Episodes of atrial fibrillation with rapid ventricular response heart rate anywhere from 110 to 125 bpm.   EKG 01/19/2022: Atrial fibrillation with controlled ventricular response at a rate of 93 bpm, left axis deviation, left anterior fascicular block. Poor R wave progression, cannot exclude anteroseptal infarct old. IVCD, incomplete left bundle branch block. Low voltage complexes, consider pulmonary disease pattern. Compared to 11/19/2021, atrial fibrillation has replaced sinus rhythm.    Assessment    1.  Anasarca with recurrent right pleural effusion and ascites 2.  Cirrhosis of liver secondary to [facility], end-stage, coagulopathy with auto anticoagulation, elevated pro time, thrombocytopenia and portal hypertension with resultant ascites and also right pleural effusion.  Acute on chronic thrombocytopenia 3.  Acute on chronic diastolic heart failure 4. Persistent atrial fib CHA2DS2-VASc Score is 5.  Yearly risk of stroke: 7% (A, HTN, DM, Vasc Dz, CHF).  Score of 1=0.6; 2=2.2; 3=3.2; 4=4.8; 5=7.2; 6=9.8; 7=>9.8) -(CHF; HTN; vasc disease DM,  Male = 1; Age <65 =0; 65-74 = 1,  >75 =2; stroke/embolism= 2).   5.   End-of-life discussion.   Recommendations:    I have added metoprolol to tartrate 25 mg p.o. twice daily and will give him 2 doses of digoxin 0.25 mg every 4 hours and then 0.0625 mg of digoxin every other day in view of renal failure, stage IV chronic kidney disease.  In spite of diuresis of 16 L since admission to the hospital, he continues to be volume overloaded with anasarca with massive ascites and recurrent right pleural effusion.  Elevated heart rate is a secondary phenomenon secondary to the above with decreased ventilation and oxygenation.  Amiodarone is not a choice in view of end-stage liver disease, patient as dictated above has coagulopathy, thrombocytopenia, portal hypertension.  His options for treatment of cirrhosis and ascites and right pleural effusion is very minimal, unable to use spironolactone in view of renal failure.  I do not think that there is much to offer with regard to therapy, conservative therapy, palliation/comfort measures were discussed with the patient's wife today.  I have also discussed this with Dr. Murray Hodgkins regarding making the patient palliative care or comfort care.  Patient's wife wants [place] to have a discussion with Dr. Harrie Jeans to see if anything can be done from renal standpoint.  In spite of aggressive diuresis, kidney function has relatively remained stable.  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   Dale Prows, MD, Dale [name] 01/29/2022, 10:00 AM Office: [phone] Fax: [phone] Pager: [phone]

## 2022-01-29 NOTE — Progress Notes (Signed)
Mobility Specialist Progress Note    01/29/22 1137  Mobility  Activity Ambulated with assistance in hallway  Level of Assistance Contact guard assist, steadying assist  Assistive Device Four wheel walker  Distance Ambulated (ft) 100 ft (50+50)  Activity Response Tolerated well  Mobility Referral Yes  $Mobility charge 1 Mobility   Pre-Mobility: 93 HR, 93% SpO2 During Mobility: 100 HR Post-Mobility: 85 HR  Pt received in chair and agreeable. No complaints on walk. Took x1 extended seated rest break. Returned to chair with call bell in reach.    Hildred Alamin Mobility Specialist  Please Psychologist, sport and exercise or Rehab Office at [phone]

## 2022-01-29 NOTE — Progress Notes (Signed)
Progress Note   Patient: Dale [name] YQM:[id number] DOB: 1950-08-14 DOA: 01/20/2022     9 DOS: the patient was seen and examined on 01/29/2022   Brief hospital course: 71 year old man complicated PMH, recently diagnosed with cirrhosis with portal hypertension, recent admission in [facility] treated with pneumonia and chest tube who was admitted for volume overload, underwent thoracentesis with 2 L fluid removal as well as paracentesis.  Condition improving, plan for home health in the next 24-48 hours. 11/29:Patient was admitted 11/30:underwent IR thoracentesis with 2 L fluid removal, ascitic albumin < 1.5-unable to calculate SAAG pe GI.Gram stain culture no growth 12/2: Paracentesis 1.3 L fluid removed 12/4: Mobilizing in the hallway with PT OT but is weak and easily gets short of breath   Consultants IR Pulmonology Gastroenterology  Cardiology  Nephrology   Procedures Successful image-guided right thoracentesis. Yielded 2 L of hazy yellow fluid. 12/1 EGD 12/2 paracentesis 1.3L  Assessment and Plan: Acute on chronic diastolic CHF, fluid overload due to liver cirrhosis hypoalbuminemia: Overall improved but still has volume overload.  Continue oral diuretic.  Nephrology has signed off.     Persistent atrial fibrillation  Discussed with Dr. Einar Gip, appreciate his consultation.  Continue Cardizem.  In addition metoprolol and low-dose digoxin added.   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  Plan for outpatient follow-up with GI.   Avoid artificial sweeteners, soda, fruit juice or candy. Avoid alcohol Repeat paracentesis prior to discharge.   Pneumonia  Appears resolved.  Completed course of antibiotics.   Recurrent right Rt pleural effusion-hepatic hydrothorax: W/ complete collapse of the RML,  subtotal collapse of  RUL w/ mediastinal shift to the left from pleural effusion. S/p US-guided thoracentesis. Pulmonary advised that chest tube will not help/contraindicated Continue medical management with diuretic, palliative care follow-up and repeat thoracentesis for symptomatic relief. Will assess for repeat thoracentesis prior to discharge.   AKI on Stage 3a chronic kidney disease (CKD): Likely multifactorial-baseline creatinine was 1.6- 1.8 recently. Stable.  Per nephrology thought to be prerenal insults with large-volume paracentesis and new diagnosis of cirrhosis. Follow-up with Dr. Royce Macadamia as an outpatient Creatinine trending down   Hypokalemia K+ 3.1 > replete   Demand ischemia with elevated but flat troponin  In the setting of CHF.  No further evaluation suggested.   Diabetes mellitus type 2 controlled on SSI. Continue to hold home glipizide.     Essential hypertension: Stable, continue po cardizem.    Obstructive sleep apnea: cont CPAP bedtime   Class 1 obesity Body mass index is 31.91 kg/m.Will benefit with PCP follow-up, weight loss  healthy lifestyle and cont cpap Deconditioning/debility continue PT OT still complains of being weak.     Subjective:  12/7 Mobility Specialist Progress Note reviewed:   Pre-Mobility: 95 HR During Mobility: 127 HR Post-Mobility: 98 HR  Feels better Walked better today with less tachycardia  Physical Exam: Vitals:   01/28/22 2000 01/28/22 2352 01/29/22 0346 01/29/22 0350  BP:  120/76  120/83  Pulse: (!) 102 (!) 105  88  Resp: 19 (!) 22  20  Temp:    (!) 97.4 F (36.3 C)  TempSrc:    Oral  SpO2: 94% 96%  91%  Weight:   89.9 kg   Height:       Physical Exam Vitals and nursing note reviewed. Chaperone present: tachy w/ movement as above in subjective.  Constitutional:  General: He is not in acute distress.    Appearance: He is not ill-appearing or toxic-appearing.  Cardiovascular:     Rate and Rhythm: Normal rate  and regular rhythm.     Heart sounds: No murmur heard. Pulmonary:     Effort: Pulmonary effort is normal. No respiratory distress.     Breath sounds: No wheezing, rhonchi or rales.  Neurological:     Mental Status: He is alert.  Psychiatric:        Mood and Affect: Mood normal.        Behavior: Behavior normal.     Data Reviewed: K+ 3.1 > replete Creatinine down to 2.22  Family Communication: wife, daughter at bedside  Disposition: Status is: Inpatient Remains inpatient appropriate because: ascities  Planned Discharge Destination: Home with Home Health    Time spent: 25 minutes  Author: Murray Hodgkins, MD 01/29/2022 8:13 AM  For on call review [email or web address].

## 2022-01-29 NOTE — Progress Notes (Signed)
Pt placed on CPAP for the night.

## 2022-01-29 NOTE — Progress Notes (Signed)
Physical Therapy Treatment Patient Details Name: Dale [name] MRN: [id number] DOB: 11-05-1950 Today's Date: 01/29/2022   History of Present Illness 71 y.o. male admitted with acute on chronic diastolic heart failure, asymmetric right pleural effusion, liver cirrhosis with abdominal distention. He had 4.9 liters UOP over 12/2.  Paracentesis on 12/2 with 1.3 liters off. Pt with past medical history of HFpEF, class I obesity, A-fib, CKD 3, chronic anemia, chronic thrombocytopenia with platelets 70 K, diabetes A1c 6.4 April, OSA on CPAP, recently diagnosed hepatic cirrhosis with portal hypertension    PT Comments    Pt is demonstrating great progress with mobility with his HR remaining more stable and </= 100 bpm throughout the session today. Pt was able to perform transfers and ambulate x2 bouts of ~62 ft using a rollator without LOB or need for physical assistance. Performed x5 sit <> stand reps to further challenge his endurance and increase his lower extremity muscular strength. Educated pt and family on support/assistance he needs, energy conservation techniques, progressing mobility, and exercises he can perform at home. Family was interested in AIR, but pt is functioning well without need for physical assistance for mobility at this time, thus still believe recommendation for HHPT follow-up is appropriate. Pt and family verbalized understanding. Will continue to follow acutely.     Recommendations for follow up therapy are one component of a multi-disciplinary discharge planning process, led by the attending physician.  Recommendations may be updated based on patient status, additional functional criteria and insurance authorization.  Follow Up Recommendations  Home health PT     Assistance Recommended at Discharge Intermittent Supervision/Assistance  Patient can return home with the following A little help with walking and/or transfers;A little help with  bathing/dressing/bathroom;Assistance with cooking/housework;Assist for transportation;Help with stairs or ramp for entrance   Equipment Recommendations  None recommended by PT    Recommendations for Other Services       Precautions / Restrictions Precautions Precautions: Fall Precaution Comments: monitor HR Restrictions Weight Bearing Restrictions: No     Mobility  Bed Mobility               General bed mobility comments: Pt up in recliner upon arrival.    Transfers Overall transfer level: Needs assistance Equipment used: Rollator (4 wheels) Transfers: Sit to/from Stand Sit to Stand: Min guard           General transfer comment: Min guard for safety and to hold the rollator still as the rollator is old with poor brakes. No LOB but pt needing extra time to power up and control descent. Pt cued to use UEs with sit <> stand to improve stability and safety    Ambulation/Gait Ambulation/Gait assistance: Min guard Gait Distance (Feet): 62 Feet (x2 bouts of ~62 ft each bout) Assistive device: Rollator (4 wheels) Gait Pattern/deviations: Step-through pattern, Decreased step length - right, Decreased step length - left, Decreased stride length, Trunk flexed Gait velocity: reduced Gait velocity interpretation: <1.31 ft/sec, indicative of household ambulator   General Gait Details: Pt with slow but mostly steady gait using rollator for support, no LOB, min guard for safety. Pt with slightly flexed posture throughout. x ~3 min seated rest break between gait bouts   Stairs             Wheelchair Mobility    Modified Rankin (Stroke Patients Only)       Balance Overall balance assessment: Needs assistance Sitting-balance support: Feet supported Sitting balance-Leahy Scale: Good     Standing  balance support: No upper extremity supported, Bilateral upper extremity supported, During functional activity Standing balance-Leahy Scale: Fair Standing balance  comment: Able to stand statically without UE support, reliant on rollator to ambulate.                            Cognition Arousal/Alertness: Awake/alert Behavior During Therapy: WFL for tasks assessed/performed Overall Cognitive Status: Within Functional Limits for tasks assessed                                          Exercises Other Exercises Other Exercises: sit <> stand 5x with eccentric control focus and cues to use UEs less to encourage LE usage but still have UE support for stability    General Comments General comments (skin integrity, edema, etc.): HR up to 100 with gait, recovered well to 70s-80s at rest; educated pt and family on energy conservation techniques, walking program and progressing mobility frequency and duration as tolerated, and on exercises pt can perform, they verbalized understanding      Pertinent Vitals/Pain Pain Assessment Pain Assessment: Faces Faces Pain Scale: Hurts a little bit Pain Location: generalized, low back Pain Descriptors / Indicators: Discomfort Pain Intervention(s): Limited activity within patient's tolerance, Monitored during session, Repositioned    Home Living                          Prior Function            PT Goals (current goals can now be found in the care plan section) Acute Rehab PT Goals Patient Stated Goal: to continue to improve endurance PT Goal Formulation: With patient/family Time For Goal Achievement: 02/08/22 Potential to Achieve Goals: Good Progress towards PT goals: Progressing toward goals    Frequency    Min 3X/week      PT Plan Current plan remains appropriate    Co-evaluation              AM-PAC PT "6 Clicks" Mobility   Outcome Measure  Help needed turning from your back to your side while in a flat bed without using bedrails?: A Little Help needed moving from lying on your back to sitting on the side of a flat bed without using bedrails?: A  Little Help needed moving to and from a bed to a chair (including a wheelchair)?: A Little Help needed standing up from a chair using your arms (e.g., wheelchair or bedside chair)?: A Little Help needed to walk in hospital room?: A Little Help needed climbing 3-5 steps with a railing? : A Lot 6 Click Score: 17    End of Session   Activity Tolerance: Patient tolerated treatment well Patient left: with call bell/phone within reach;in chair;with family/visitor present Nurse Communication: Mobility status;Other (comment) (family could walk pt if nursing gets them set up) PT Visit Diagnosis: Unsteadiness on feet (R26.81);Muscle weakness (generalized) (M62.81);Difficulty in walking, not elsewhere classified (R26.2);Other abnormalities of gait and mobility (R26.89)     Time: 5462-7035 PT Time Calculation (min) (ACUTE ONLY): 31 min  Charges:  $Therapeutic Exercise: 8-22 mins $Therapeutic Activity: 8-22 mins                     Dale [name], PT, DPT Acute Rehabilitation Services  Office: [phone]    Orvan Falconer 01/29/2022, 3:07  PM

## 2022-01-30 ENCOUNTER — Inpatient Hospital Stay (HOSPITAL_COMMUNITY): Payer: Medicare Other

## 2022-01-30 LAB — GLUCOSE, CAPILLARY
Glucose-Capillary: 178 mg/dL — ABNORMAL HIGH (ref 70–99)
Glucose-Capillary: 200 mg/dL — ABNORMAL HIGH (ref 70–99)
Glucose-Capillary: 207 mg/dL — ABNORMAL HIGH (ref 70–99)
Glucose-Capillary: 173 mg/dL — ABNORMAL HIGH (ref 70–99)

## 2022-01-30 LAB — BASIC METABOLIC PANEL
Anion gap: 13 (ref 5–15)
BUN: 85 mg/dL — ABNORMAL HIGH (ref 8–23)
CO2: 26 mmol/L (ref 22–32)
Calcium: 9 mg/dL (ref 8.9–10.3)
Chloride: 94 mmol/L — ABNORMAL LOW (ref 98–111)
Creatinine, Ser: 2.36 mg/dL — ABNORMAL HIGH (ref 0.61–1.24)
GFR, Estimated: 29 mL/min — ABNORMAL LOW (ref >60)
Glucose, Bld: 150 mg/dL — ABNORMAL HIGH (ref 70–99)
Potassium: 3.9 mmol/L (ref 3.5–5.1)
Sodium: 133 mmol/L — ABNORMAL LOW (ref 135–145)

## 2022-01-30 LAB — PROTIME-INR
INR: 1.7 — ABNORMAL HIGH (ref 0.8–1.2)
Prothrombin Time: 20.1 seconds — ABNORMAL HIGH (ref 11.4–15.2)

## 2022-01-30 MED ORDER — LIDOCAINE HCL (PF) 1 % IJ SOLN
INTRAMUSCULAR | Status: AC
  Filled 2022-01-30: qty 30

## 2022-01-30 MED ORDER — LIDOCAINE HCL (PF) 1 % IJ SOLN: 5.0000 mL | Freq: Once | INTRAMUSCULAR | Status: DC

## 2022-01-30 NOTE — TOC Progression Note (Addendum)
Transition of Care [facility] - Progression Note    Patient Details  Name: Dale [name] MRN: [id number] Date of Birth: Aug 23, 1950  Transition of Care [facility]) CM/SW Contact  Carles Collet, RN Phone Number: 01/30/2022, 1:43 PM  Clinical Narrative:     Damaris Schooner w patient and wife at bedside.  They are interested in home palliative services and continuing [facility] services with [facility]. Discussed providers and referral made to HoP liaison Benjamine Mola.  Orders placed for resumption of services with [facility].  [place] address is [address] [place] [address]  Expected Discharge Plan: [place] Barriers to Discharge: Continued Medical Work up  Expected Discharge Plan and Services Expected Discharge Plan: [place] In-house Referral: Clinical Social Work Discharge Planning Services: CM Consult   Living arrangements for the past 2 months: [place]: RN, PT, OT [place] Agency: [facility] [place] Date [place]: 01/30/22 Time [place]: 6834 Representative spoke with at [place]: [place] Determinants of Health ([place]) Interventions    Readmission Risk Interventions     No data to display

## 2022-01-30 NOTE — Progress Notes (Signed)
Palliative Medicine Inpatient Follow Up Note HPI: 70 y.o. male  with past medical history of HFpEF, class I obesity, A-fib, CKD 3, chronic anemia, chronic thrombocytopenia with platelets 70 K, diabetes A1c 6.4 April, OSA on CPAP, and recently diagnosed hepatic cirrhosis with portal hypertension admitted on 01/20/2022 with acute on chronic diastolic heart failure, asymmetric right pleural effusion, and liver cirrhosis with abdominal distention.     Today's Discussion 01/30/2022  *Please note that this is a verbal dictation therefore any spelling or grammatical errors are due to the "[employer]" system interpretation.  Chart reviewed inclusive of vital signs, progress notes, laboratory results, and diagnostic images.   I checked in with Dale [name] and his spouse, Dale [name] this late morning. We discussed the difficult conversation which was held with Dr. Einar [name] yesterday in the setting of his end stage liver disease. Discussed that he continues to be volume overloaded with anasarca with ascites and recurrent right pleural effusion. None of this is expected to get better though the plan at this time is for maintenance of symptoms.   Discussed option of regular visits with radiology for thoracentesis and paracentesis. I shared that for some patients this can be as often as once every three days or weekly depending on need.   Discussed that patients HR is under better control with helps to decrease his dyspnea with mobility.   Reviewed per MD Dale [name] the plan for a paracentesis today, thoracentesis tomorrow, and p[possible discharge home.   Confirmed patients desire to "keep fighting" and wish to remain full code at this time. We further reviewed that given Dale [name]'s present state and ongoing symptom needs he may benefit from OP Palliative care who can also help with additional goals of care conversations. Created space and opportunity for patient to explore thoughts feelings and fears regarding current  medical situation. He and his wife are agreeable to OP Palliative support though voice concern in the setting of insurance as presently Dale [name] only has Medicare Part A. I shared that I would touch base with TOC to verify if insurance would or would not cover OP Palliative support.  Additionally, patients spouse requests a bedside commode as Dale [name] always has "belly pain and bowel movements shortly thereafter." This would make his care in the home easier.   Offered empathetic support considering the difficulty of Dale [name]'s health scenario at this time.   Questions and concerns addressed/Palliative Support Provided.   Objective Assessment: Vital Signs Vitals:   01/30/22 0600 01/30/22 0823  BP:  105/65  Pulse:  89  Resp: 12 15  Temp:    SpO2:  93%    Intake/Output Summary (Last 24 hours) at 01/30/2022 1242 Last data filed at 01/30/2022 9147 Gross per 24 hour  Intake --  Output 1100 ml  Net -1100 ml   Last Weight  Most recent update: 01/30/2022  5:11 AM    Weight  88.9 kg (195 lb 15.8 oz)            Gen: Elderly Caucasian M in NAD HEENT: moist mucous membranes CV: Regular rate and irregular rhythm  PULM:  On RA, breathing is nonlabored ABD: (+) Distension EXT: TEDs on Neuro: Alert and oriented x3  SUMMARY OF RECOMMENDATIONS   Full Code  Have offered OP Palliative support --> TOC to help identify if this will be covered by medicare part A  Request for DME commode to primary team  Plan for possible discharge tomorrow  Incremental support  Billing based on MDM: High  Problems Addressed: One acute or chronic illness or injury that poses a threat to life or bodily function  Amount and/or Complexity of Data: Category 3:Discussion of management or test interpretation with external physician/other qualified health care professional/appropriate source (not separately reported)  Risks: Decision regarding hospitalization or escalation of hospital care and Decision not to  resuscitate or to de-escalate care because of poor prognosis ______________________________________________________________________________________ [place] Team Team Cell Phone: [phone] Please utilize secure chat with additional questions, if there is no response within 30 minutes please call the above phone number  Palliative Medicine Team providers are available by phone from 7am to 7pm daily and can be reached through the team cell phone.  Should this patient require assistance outside of these hours, please call the patient's attending physician.

## 2022-01-30 NOTE — Progress Notes (Signed)
Progress Note   Patient: Dale [name] [id number]:[id number] DOB: 1950-04-02 DOA: 01/20/2022     10 DOS: the patient was seen and examined on 01/30/2022   Brief hospital course: 71 year old man complicated PMH, recently diagnosed with cirrhosis with portal hypertension, recent admission in [facility] treated with pneumonia and chest tube who was admitted for volume overload, underwent thoracentesis with 2 L fluid removal as well as paracentesis.  Condition improving, plan for home health in the next 24-48 hours. 11/29:Patient was admitted 11/30:underwent IR thoracentesis with 2 L fluid removal, ascitic albumin < 1.5-unable to calculate SAAG pe GI.Gram stain culture no growth 12/2: Paracentesis 1.3 L fluid removed 12/4: Mobilizing in the hallway with PT OT but is weak and easily gets short of breath   Consultants IR Pulmonology Gastroenterology  Cardiology  Nephrology   Procedures Successful image-guided right thoracentesis. Yielded 2 L of hazy yellow fluid. 12/1 EGD 12/2 paracentesis 1.3L  Assessment and Plan: Acute on chronic diastolic CHF, fluid overload due to liver cirrhosis hypoalbuminemia: Overall improved. Continue oral diuretic.  Nephrology has signed off.     Persistent atrial fibrillation  Heart rate better controlled, allowing for better movement.  Continue Cardizem, metoprolol and digoxin.   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  Plan for outpatient follow-up with GI.   Avoid artificial sweeteners, soda, fruit juice or candy. Avoid alcohol Repeat paracentesis today removed 1.8 L.  Recurrent right Rt pleural effusion-hepatic hydrothorax: W/ complete collapse of the RML, subtotal collapse of  RUL w/ mediastinal shift to the left from pleural effusion. S/p US-guided thoracentesis. Pulmonary  advised that chest tube will not help/contraindicated Continue medical management with diuretic, palliative care follow-up and repeat thoracentesis for symptomatic relief. Repeat thoracentesis tomorrow and then likely discharge.   AKI on Stage 3a chronic kidney disease (CKD): Likely multifactorial-baseline creatinine was 1.6- 1.8 recently. Stable.  Per nephrology thought to be prerenal insults with large-volume paracentesis and new diagnosis of cirrhosis. Follow-up with Dr. Royce Macadamia as an outpatient Creatinine stable  Pneumonia  Resolved.  Completed course of antibiotics.   Hypokalemia Repleted    Demand ischemia with elevated but flat troponin  In the setting of CHF.  No further evaluation suggested.   Diabetes mellitus type 2 controlled on SSI. Continue to hold home glipizide.     Essential hypertension: Stable, continue po cardizem.    Obstructive sleep apnea: cont CPAP bedtime   Class 1 obesity Body mass index is 31.91 kg/m.Will benefit with PCP follow-up, weight loss  healthy lifestyle and cont cpap Deconditioning/debility continue PT OT        Subjective:  Feels better after paracentesis. Up several times to bathroom Moving better but knees are weak  Physical Exam: Vitals:   01/30/22 0400 01/30/22 0500 01/30/22 0501 01/30/22 0600  BP:   108/67   Pulse:   77   Resp: 13 13 18 12   Temp:   (!) 97.3 F (36.3 C)   TempSrc:   Oral   SpO2:   92%   Weight:   88.9 kg   Height:       Physical Exam Vitals reviewed.  Constitutional:      General: He is not in acute distress.    Appearance: He is not ill-appearing or toxic-appearing.  Cardiovascular:     Rate and Rhythm: Normal rate and regular rhythm.     Heart sounds: No murmur heard. Pulmonary:  Effort: Pulmonary effort is normal. No respiratory distress.     Breath sounds: No wheezing or rales.     Comments: Poor air movement on right Musculoskeletal:     Right lower leg: No edema.     Left lower leg: No  edema.  Neurological:     Mental Status: He is alert.  Psychiatric:        Mood and Affect: Mood normal.        Behavior: Behavior normal.     Data Reviewed: BMP noted INR stable 1.7  Family Communication: wife at bedside  Disposition: Status is: Inpatient Remains inpatient appropriate because: plan thoracentesis  Planned Discharge Destination: Home with Home Health    Time spent: 25 minutes  Author: Murray Hodgkins, MD 01/30/2022 8:01 AM  For on call review [email or web address].

## 2022-01-31 ENCOUNTER — Inpatient Hospital Stay (HOSPITAL_COMMUNITY): Payer: Medicare Other

## 2022-01-31 DIAGNOSIS — K7210 Chronic hepatic failure without coma: Secondary | ICD-10-CM

## 2022-01-31 LAB — BASIC METABOLIC PANEL
Anion gap: 14 (ref 5–15)
BUN: 89 mg/dL — ABNORMAL HIGH (ref 8–23)
CO2: 25 mmol/L (ref 22–32)
Calcium: 9 mg/dL (ref 8.9–10.3)
Chloride: 94 mmol/L — ABNORMAL LOW (ref 98–111)
Creatinine, Ser: 2.53 mg/dL — ABNORMAL HIGH (ref 0.61–1.24)
GFR, Estimated: 26 mL/min — ABNORMAL LOW (ref >60)
Glucose, Bld: 161 mg/dL — ABNORMAL HIGH (ref 70–99)
Potassium: 4.1 mmol/L (ref 3.5–5.1)
Sodium: 133 mmol/L — ABNORMAL LOW (ref 135–145)

## 2022-01-31 LAB — PROTIME-INR
INR: 1.6 — ABNORMAL HIGH (ref 0.8–1.2)
Prothrombin Time: 19.1 seconds — ABNORMAL HIGH (ref 11.4–15.2)

## 2022-01-31 LAB — GLUCOSE, CAPILLARY
Glucose-Capillary: 131 mg/dL — ABNORMAL HIGH (ref 70–99)
Glucose-Capillary: 185 mg/dL — ABNORMAL HIGH (ref 70–99)

## 2022-01-31 MED ORDER — GLIPIZIDE ER 2.5 MG PO TB24: 5.0000 mg | ORAL_TABLET | Freq: Every day | ORAL | 0 refills | Status: DC

## 2022-01-31 MED ORDER — CEPHALEXIN 500 MG PO CAPS: 500.0000 mg | ORAL_CAPSULE | Freq: Three times a day (TID) | ORAL | 0 refills | Status: DC

## 2022-01-31 MED ORDER — DIGOXIN 62.5 MCG PO TABS: 0.0625 mg | ORAL_TABLET | ORAL | 1 refills | Status: AC

## 2022-01-31 MED ORDER — LIDOCAINE HCL (PF) 1 % IJ SOLN
INTRAMUSCULAR | Status: AC
  Filled 2022-01-31: qty 30

## 2022-01-31 MED ORDER — LIDOCAINE HCL (PF) 1 % IJ SOLN: 5.0000 mL | Freq: Once | INTRAMUSCULAR | Status: DC

## 2022-01-31 MED ORDER — TORSEMIDE 40 MG PO TABS: 40.0000 mg | ORAL_TABLET | Freq: Every day | ORAL | 2 refills | Status: AC

## 2022-01-31 MED ORDER — METOPROLOL TARTRATE 25 MG PO TABS: 25.0000 mg | ORAL_TABLET | Freq: Two times a day (BID) | ORAL | 2 refills | Status: DC

## 2022-01-31 NOTE — Progress Notes (Signed)
   Palliative Medicine Inpatient Follow Up Note   Patient has had thoracentesis performed this morning.   Plan for discharge this afternoon.   Appreciate TOC helping to arrange OP Palliative support on discharge.  Palliative care will be available if any additional questions or concerns arise.   No Charge ______________________________________________________________________________________ [place] Team Team Cell Phone: [phone] Please utilize secure chat with additional questions, if there is no response within 30 minutes please call the above phone number  Palliative Medicine Team providers are available by phone from 7am to 7pm daily and can be reached through the team cell phone.  Should this patient require assistance outside of these hours, please call the patient's attending physician.

## 2022-01-31 NOTE — Discharge Summary (Signed)
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   Paracentesis as needed for symptom relief   Recurrent right Rt pleural effusion-hepatic hydrothorax: Continue medical management with diuretic, palliative care follow-up and repeat thoracentesis for symptomatic relief.   AKI on Stage 3a chronic kidney disease (CKD): Follow-up with Dr. Royce Macadamia as an outpatient Creatinine 2.36 > 2.53, stable    Discharge Diagnoses: Principal Problem:   Acute on chronic diastolic heart failure (HCC) Active Problems:   Atrial fibrillation with RVR ([place])   Diabetes mellitus type 2, controlled ([place])   Essential hypertension   Hyperlipidemia   Obstructive sleep apnea (adult) (pediatric)   Macrocytic anemia   Stage 3a chronic kidney disease (CKD) (HCC)   Cirrhosis of liver with ascites (HCC)   Pleural effusion transudative   Acute renal failure superimposed on stage 3b chronic kidney disease (HCC)   Pressure injury of skin   Duodenitis   Secondary esophageal varices without bleeding (HCC)   Anasarca   Nonalcoholic steatohepatitis ([facility])   Other ascites   Pleural effusion associated with hepatic disorder   End stage liver disease (HCC)   Recurrent right pleural effusion  Resolved Problems:   * No resolved hospital problems. *  Hospital Course: 72 year old man complicated PMH, recently diagnosed with cirrhosis with portal hypertension, recent admission in [facility] treated with pneumonia and chest tube who was admitted for volume overload, underwent thoracentesis with 2 L fluid removal as  well as paracentesis.  Condition improving, plan for home health in the next 24-48 hours. 11/29:Patient was admitted 11/30:underwent IR thoracentesis with 2 L fluid removal, ascitic albumin < 1.5-unable to calculate SAAG pe GI.Gram stain culture no growth 12/2: Paracentesis 1.3 L fluid removed 12/4: Mobilizing in the hallway with PT OT but is weak and easily gets short of breath  12/5-12/10 gradual slow improvement, complicated by RVR with movement which went well to the addition of beta-blocker and digoxin.  Status post paracentesis 12/9 and thoracentesis 4/10 and discharged home.  Consultants IR Pulmonology Gastroenterology  Cardiology  Nephrology   Procedures Successful image-guided right thoracentesis. Yielded 2 L of hazy yellow fluid. 12/1 EGD 12/2 paracentesis 1.3L 12/9 paracentesis 12/10 thoracentesis  Acute on chronic diastolic CHF, fluid overload due to liver cirrhosis hypoalbuminemia: Overall improved. Continue oral diuretic.  Nephrology has signed off.     Persistent atrial fibrillation  Heart rate better controlled, allowing for better movement.  Continue Cardizem, metoprolol and digoxin.   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  Plan for outpatient follow-up with GI.   Avoid artificial sweeteners, soda, fruit juice or candy. Avoid alcohol Repeat paracentesis today removed 1.8 L. INR 1.6, stable   Recurrent right Rt pleural effusion-hepatic hydrothorax: W/ complete collapse of the RML, subtotal collapse of  RUL w/ mediastinal shift to the left from pleural effusion. S/p US-guided thoracentesis. Pulmonary advised that chest tube will not help/contraindicated Continue medical management with diuretic, palliative care follow-up and repeat thoracentesis for symptomatic relief. Repeat  thoracentesis today Yielded 1,750 mL of hazy amber fluid.    AKI on Stage  3a chronic kidney disease (CKD): Likely multifactorial-baseline creatinine was 1.6- 1.8 recently. Stable.  Per nephrology thought to be prerenal insults with large-volume paracentesis and new diagnosis of cirrhosis. Follow-up with Dr. Royce Macadamia as an outpatient Creatinine 2.36 > 2.53, stable   Pneumonia  Resolved.  Completed course of antibiotics.   Hypokalemia Repleted    Demand ischemia with elevated but flat troponin  In the setting of CHF.  No further evaluation suggested.   Diabetes mellitus type 2 CBG stable. Continue SSI.  Resume home glipizide.     Essential hypertension: Stable, continue po cardizem, beta-blocker.   Obstructive sleep apnea: cont CPAP bedtime   Class 1 obesity Body mass index is 31.91 k      Disposition: Home health Diet recommendation:  Cardiac and Carb modified diet DISCHARGE MEDICATION: Allergies as of 01/31/2022       Reactions   Nsaids Other (See Comments)   NOT to take these due to kidney function   Allopurinol Other (See Comments)   Foot pain, not a rash., 19 Mar 2010        Medication List     TAKE these medications    Accu-Chek Guide test strip Generic drug: glucose blood   atorvastatin 80 MG tablet Commonly known as: LIPITOR Take 80 mg by mouth daily.   cephALEXin 500 MG capsule Commonly known as: KEFLEX Take 1 capsule (500 mg total) by mouth 3 (three) times daily for 10 days.   cholecalciferol 25 MCG (1000 UNIT) tablet Commonly known as: VITAMIN D3 Take 1,000 Units by mouth daily.   Digoxin 62.5 MCG Tabs Take 0.0625 mg by mouth every other day. Start taking on: February 01, 2022   diltiazem 120 MG 24 hr capsule Commonly known as: CARDIZEM CD Take 120 mg by mouth daily.   fenofibrate 145 MG tablet Commonly known as: TRICOR Take 145 mg by mouth daily.   glipiZIDE 2.5 MG 24 hr tablet Commonly known as: GLUCOTROL XL Take 2 tablets (5 mg  total) by mouth daily with breakfast. What changed: medication strength   metoprolol tartrate 25 MG tablet Commonly known as: LOPRESSOR Take 1 tablet (25 mg total) by mouth 2 (two) times daily.   onetouch ultrasoft lancets Test blood sugar twice a day and as needed   Torsemide 40 MG Tabs Take 40 mg by mouth daily. Start taking on: February 01, 2022 What changed:  medication strength how much to take when to take this               Durable Medical Equipment  (From admission, onward)           Start     Ordered   01/30/22 1244  For home use only DME Bedside commode  Once       Question:  Patient needs a bedside commode to treat with the following condition  Answer:  CHF (congestive heart failure) ([place])   01/30/22 1243              Discharge Care Instructions  (From admission, onward)           Start     Ordered   01/31/22 0000  Discharge wound care:       Comments: Use barrier cream and foam dressing, frequent position changes   01/31/22 1209            Follow-up Information     [facility] [place] Follow up.   Specialty: PALLIATIVE CARE Contact information: [address] [place]  [place] [address] [place], [facility] Follow up.   Specialty: Home Health Services Contact information: [address] [place] [address] [phone]         Christain Sacramento, MD. Schedule an appointment as soon as possible for a visit in 1 week(s).   Specialty: Family Medicine Contact information: [address] [place] [address] [phone]         Claudia Desanctis, MD. Schedule an appointment as soon as possible for a visit in 2 week(s).   Specialty: Nephrology Contact information: [place] [address] [phone]         Adrian Prows, MD. Schedule an appointment as soon as possible for a visit in 2 week(s).   Specialty: Cardiology Contact  information: [place] [address] [phone]                 Feels better  Discharge Exam: Filed Weights   01/28/22 0646 01/29/22 0346 01/30/22 0501  Weight: 89 kg 89.9 kg 88.9 kg   Physical Exam Vitals reviewed.  Constitutional:      General: He is not in acute distress.    Appearance: He is not ill-appearing or toxic-appearing.  Cardiovascular:     Rate and Rhythm: Normal rate and regular rhythm.     Heart sounds: No murmur heard. Pulmonary:     Effort: Pulmonary effort is normal. No respiratory distress.     Breath sounds: No wheezing, rhonchi or rales.     Comments: Poor air movement on the right Neurological:     Mental Status: He is alert.  Psychiatric:        Mood and Affect: Mood normal.        Behavior: Behavior normal.      Condition at discharge: fair  The results of significant diagnostics from this hospitalization (including imaging, microbiology, ancillary and laboratory) are listed below for reference.   Imaging Studies: US THORACENTESIS ASP PLEURAL SPACE W/IMG GUIDE  Result Date: 01/31/2022 INDICATION: Patient with history of CHF hepatic steatosis presents with shortness of breath, previous chest x-ray showed right pleural effusion. Request for therapeutic thoracentesis. EXAM: ULTRASOUND GUIDED RIGHT THORACENTESIS MEDICATIONS: 5 mL 1% lidocaine COMPLICATIONS: None immediate. PROCEDURE: An ultrasound guided thoracentesis was thoroughly discussed with the patient and questions answered. The benefits, risks, alternatives and complications were also discussed. The patient understands and wishes to proceed with the procedure. Written consent was obtained. Ultrasound was performed to localize and mark an adequate pocket of fluid in the right chest. The area was then prepped and draped in the normal sterile fashion. 1% Lidocaine was used for local anesthesia. Under ultrasound guidance a 6 Fr Safe-T-Centesis catheter was  introduced. Thoracentesis was performed. The catheter was removed and a dressing applied. FINDINGS: A total of approximately 1,750 mL of hazy amber fluid was removed. Post procedure chest X-ray reviewed, negative for pneumothorax. IMPRESSION: Successful ultrasound guided right thoracentesis yielding 1,750 mL of pleural fluid. Read by: Durenda Guthrie, PA-C Electronically Signed   By: Ruthann Cancer M.D.   On: 01/31/2022 12:27   DG Chest 1 View  Result Date: 01/31/2022 CLINICAL DATA:  Pleural effusion status post thoracentesis. EXAM: CHEST  1 VIEW COMPARISON:  01/24/2022 FINDINGS: 1006 hours. Lordotic positioning. Interval decrease in right pleural effusion with persistent right base collapse/consolidation and residual right pleural effusion evident. No findings to suggest right-sided pneumothorax.  Left lung clear. Cardiopericardial silhouette is at upper limits of normal for size. Telemetry leads overlie the chest. IMPRESSION: No evidence for pneumothorax status post right thoracentesis. Electronically Signed   By: Misty Stanley M.D.   On: 01/31/2022 10:26   US Paracentesis  Result Date: 01/30/2022 INDICATION: History of congestive heart failure and hepatic steatosis. Recurrent ascites. Request for therapeutic paracentesis. EXAM: ULTRASOUND GUIDED RIGHT LOWER QUADRANT PARACENTESIS MEDICATIONS: 1% plain lidocaine, 5 mL COMPLICATIONS: None immediate. PROCEDURE: Informed written consent was obtained from the patient after a discussion of the risks, benefits and alternatives to treatment. A timeout was performed prior to the initiation of the procedure. Initial ultrasound scanning demonstrates a moderate amount of ascites within the right lower abdominal quadrant. The right lower abdomen was prepped and draped in the usual sterile fashion. 1% lidocaine was used for local anesthesia. Following this, a 19 gauge, 7-cm, Yueh catheter was introduced. An ultrasound image was saved for documentation purposes. The  paracentesis was performed. The catheter was removed and a dressing was applied. The patient tolerated the procedure well without immediate post procedural complication. FINDINGS: A total of approximately 1.6 L of clear yellow fluid was removed. IMPRESSION: Successful ultrasound-guided paracentesis yielding 1.6 liters of peritoneal fluid. Read by: Ascencion Dike PA-C PLAN: If the patient eventually requires >/=2 paracenteses in a 30 day period, candidacy for formal evaluation by the [facility] will be assessed. Electronically Signed   By: Ruthann Cancer M.D.   On: 01/30/2022 13:07   DG CHEST PORT 1 VIEW  Result Date: 01/24/2022 CLINICAL DATA:  Right-sided pleural effusion follow-up. EXAM: PORTABLE CHEST 1 VIEW COMPARISON:  January 23, 2022 FINDINGS: Cardiomediastinal silhouette is stable. No pneumothorax. The left lung is clear. A moderate right pleural effusion with underlying opacity remains. IMPRESSION: A moderate right pleural effusion with underlying opacity remains. No pneumothorax. No other changes. Electronically Signed   By: Dorise Bullion III M.D.   On: 01/24/2022 13:29   US Paracentesis  Result Date: 01/23/2022 INDICATION: Congestive heart failure and steatotic liver disease with ascites. Request for diagnostic and therapeutic paracentesis. EXAM: ULTRASOUND GUIDED PARACENTESIS MEDICATIONS: 1% lidocaine 10 mL COMPLICATIONS: None immediate. PROCEDURE: Informed written consent was obtained from the patient after a discussion of the risks, benefits and alternatives to treatment. A timeout was performed prior to the initiation of the procedure. Initial ultrasound scanning demonstrates a moderate amount of ascites within the right upper quadrant. The right upper quadrant was prepped and draped in the usual sterile fashion. 1% lidocaine was used for local anesthesia. Following this, a 19 gauge, 7-cm, Yueh catheter was introduced. An ultrasound image was  saved for documentation purposes. The paracentesis was performed. The catheter was removed and a dressing was applied. The patient tolerated the procedure well without immediate post procedural complication. FINDINGS: A total of approximately 1.3 L of clear yellow fluid was removed. Samples were sent to the laboratory as requested by the clinical team. IMPRESSION: Successful ultrasound-guided paracentesis yielding 1.3 liters of peritoneal fluid. Procedure performed by: Gareth Eagle, PA-C Electronically Signed   By: Albin Felling M.D.   On: 01/23/2022 13:35   DG Chest Port 1 View  Result Date: 01/23/2022 CLINICAL DATA:  Pleural effusion EXAM: PORTABLE CHEST 1 VIEW COMPARISON:  01/21/2022 FINDINGS: Large pleural effusion is stable finding on the right. Left lung clear. Normal pulmonary vasculature. No pneumothorax. Cardiac silhouette is obscured. IMPRESSION: Large right-sided pleural effusion without interval change. Electronically Signed   By: Samuel Germany.D.  On: 01/23/2022 09:47   IR THORACENTESIS ASP PLEURAL SPACE W/IMG GUIDE  Result Date: 01/21/2022 INDICATION: Patient with history of CHF presents with shortness of breath, previous CT showed large right pleural effusion. Request for therapeutic and diagnostic thoracentesis. EXAM: ULTRASOUND GUIDED RIGHT THORACENTESIS MEDICATIONS: 5 mL 1% lidocaine COMPLICATIONS: None immediate. PROCEDURE: An ultrasound guided thoracentesis was thoroughly discussed with the patient and questions answered. The benefits, risks, alternatives and complications were also discussed. The patient understands and wishes to proceed with the procedure. Written consent was obtained. Ultrasound was performed to localize and mark an adequate pocket of fluid in the right chest. The area was then prepped and draped in the normal sterile fashion. 1% Lidocaine was used for local anesthesia. Under ultrasound guidance a 6 Fr Safe-T-Centesis catheter was introduced. Thoracentesis was  performed. The catheter was removed and a dressing applied. FINDINGS: A total of approximately 2 L of hazy yellow fluid was removed. Samples were sent to the laboratory as requested by the clinical team. Post procedure chest X-ray reviewed, negative for pneumothorax. IMPRESSION: Successful ultrasound guided right thoracentesis yielding 2 L of pleural fluid. Read by: Durenda Guthrie, PA-C Electronically Signed   By: Jerilynn Mages.  Shick M.D.   On: 01/21/2022 10:51   DG Chest 1 View  Result Date: 01/21/2022 CLINICAL DATA:  Status post right-sided thoracentesis EXAM: CHEST  1 VIEW COMPARISON:  01/20/2022 chest CT.  Plain film 01/20/2022 FINDINGS: Midline trachea. Normal heart size. Moderate right pleural effusion is relatively similar to the prior plain film. No pneumothorax. No left-sided pleural effusion. No congestive failure. Right lower lung airspace disease is not significantly changed. IMPRESSION: No pneumothorax after thoracentesis. Similar moderate right-sided pleural effusion with adjacent airspace disease. Electronically Signed   By: Abigail Miyamoto M.D.   On: 01/21/2022 08:50   CT CHEST WO CONTRAST  Result Date: 01/20/2022 CLINICAL DATA:  Shortness of breath (Ped 0-17y) EXAM: CT CHEST WITHOUT CONTRAST TECHNIQUE: Multidetector CT imaging of the chest was performed following the standard protocol without IV contrast. RADIATION DOSE REDUCTION: This exam was performed according to the departmental dose-optimization program which includes automated exposure control, adjustment of the mA and/or kV according to patient size and/or use of iterative reconstruction technique. COMPARISON:  None Available. FINDINGS: Cardiovascular: Minimal coronary artery calcification. Global cardiac size within normal limits. No pericardial effusion. Central pulmonary arteries are enlarged in keeping with changes of pulmonary arterial hypertension. Mild atherosclerotic calcification within the thoracic aorta. No aortic aneurysm.  Mediastinum/Nodes: There is mediastinal shift to the left secondary to the large right pleural effusion. Visualized thyroid is unremarkable. No pathologic thoracic adenopathy. Esophagus is unremarkable. Lungs/Pleura: Large right pleural effusion is present with complete collapse of the right middle and lower lobes and subtotal collapse of the right upper lobe. As noted above, there is resultant mediastinal shift to the left. Left lung is clear. No pneumothorax. No pleural effusion on the left. No central obstructing lesion. Upper Abdomen: At least moderate ascites noted within the perisplenic and perihepatic regions. Musculoskeletal: Osseous structures are age-appropriate. No acute bone abnormality. No lytic or blastic bone lesion. IMPRESSION: 1. Large right pleural effusion demonstrating mass effect with complete collapse of the right middle and lower lobes and subtotal collapse of the right upper lobe. Resultant mediastinal shift to the left. 2. Minimal coronary artery calcification. 3. Morphologic changes in keeping with pulmonary arterial hypertension. 4. At least moderate ascites within the visualized upper abdomen. Aortic Atherosclerosis (ICD10-I70.0). Electronically Signed   By: Fidela Salisbury M.D.   On:  01/20/2022 18:43   [place] ASCITES (ABDOMEN LIMITED)  Result Date: 01/20/2022 CLINICAL DATA:  Ascites EXAM: LIMITED ABDOMEN ULTRASOUND FOR ASCITES TECHNIQUE: Limited ultrasound survey for ascites was performed in all four abdominal quadrants. COMPARISON:  01/20/2021 FINDINGS: Moderate volume ascites within the abdomen, largest pocket seen within the right upper quadrant. IMPRESSION: Moderate volume ascites. Electronically Signed   By: Davina Poke D.O.   On: 01/20/2022 16:19   DG Chest Port 1 View  Result Date: 01/20/2022 CLINICAL DATA:  Acute exacerbation of congestive heart failure. EXAM: PORTABLE CHEST 1 VIEW COMPARISON:  None Available. FINDINGS: Heart is enlarged. Lung volumes are low.  Asymmetric right pleural effusion and airspace disease is present. Mild pulmonary vascular congestion is present on the left. No significant left-sided edema or effusion is present. Degenerative changes are present at both shoulders. IMPRESSION: 1. Asymmetric right pleural effusion and airspace disease is concerning for infection or aspiration. 2. Cardiomegaly and mild pulmonary vascular congestion. Given the asymmetry, this does not appear to be congestive heart failure. Electronically Signed   By: San Morelle M.D.   On: 01/20/2022 15:54    Microbiology: Results for orders placed or performed during the hospital encounter of 01/20/22  Culture, body fluid w Gram Stain-bottle     Status: None   Collection Time: 01/21/22  8:36 AM   Specimen: Pleura  Result Value Ref Range Status   Specimen Description PLEURAL  Final   Special Requests NONE  Final   Culture   Final    NO GROWTH 5 DAYS Performed at [facility], [address], [place], [place] [address]    Report Status 01/26/2022 FINAL  Final  Culture, body fluid w Gram Stain-bottle     Status: None   Collection Time: 01/23/22 12:12 PM   Specimen: Fluid  Result Value Ref Range Status   Specimen Description FLUID  Final   Special Requests  PERITONEAL  Final   Culture   Final    NO GROWTH 5 DAYS Performed at [facility], [place] [address], [place], [place] [address]    Report Status 01/28/2022 FINAL  Final  Gram stain     Status: None   Collection Time: 01/23/22 12:12 PM   Specimen: Fluid  Result Value Ref Range Status   Specimen Description FLUID  Final   Special Requests  PERITONEAL  Final   Gram Stain   Final    CYTOSPIN SMEAR WBC PRESENT, PREDOMINANTLY PMN NO ORGANISMS SEEN Performed at [facility], [place] [address], [place], [place] [address]    Report Status 01/23/2022 FINAL  Final    Labs: CBC: Recent Labs  Lab 01/25/22 0110 01/26/22 0133 01/27/22 0107  WBC 6.2 6.4 7.9  HGB 11.4* 11.8* 12.2*   HCT 33.0* 33.0* 34.1*  MCV 96.8 95.1 94.5  PLT 34* 37* 36*   Basic Metabolic Panel: Recent Labs  Lab 01/27/22 0107 01/28/22 0826 01/29/22 0103 01/30/22 0046 01/31/22 0047  NA 137 134* 134* 133* 133*  K 3.9 3.1* 3.1* 3.9 4.1  CL 96* 91* 93* 94* 94*  CO2 26 29 28 26 25   GLUCOSE 180* 226* 159* 150* 161*  BUN 81* 80* 83* 85* 89*  CREATININE 2.22* 2.31* 2.22* 2.36* 2.53*  CALCIUM 9.8 9.3 9.2 9.0 9.0   Liver Function Tests: No results for input(s): "AST", "ALT", "ALKPHOS", "BILITOT", "PROT", "ALBUMIN" in the last 168 hours. CBG: Recent Labs  Lab 01/30/22 1050 01/30/22 1643 01/30/22 2102 01/31/22 6967 01/31/22 1139  GLUCAP 200* 207* 173* 131* 185*    Discharge time spent: greater than 30 minutes.  Signed: Murray Hodgkins, MD [facility] 01/31/2022

## 2022-01-31 NOTE — Procedures (Addendum)
PROCEDURE SUMMARY:  Successful image-guided right thoracentesis. Yielded 1,750 mL of hazy amber fluid. Pt tolerated procedure well. No immediate complications. EBL = trace   Specimen was not sent for labs. CXR ordered.  Thoracentesis was terminated after removing 1,750 mL of pleural fluid due to development of chest discomfort and cough. Post procedural ultrasound showed small residual right pleural effusion.   Please see imaging section of Epic for full dictation.  Armando Gang Guerino Caporale PA-C 01/31/2022 10:05 AM

## 2022-01-31 NOTE — Plan of Care (Signed)
  Problem: Education: Goal: Knowledge of General Education information will improve Description: Including pain rating scale, medication(s)/side effects and non-pharmacologic comfort measures Outcome: Completed/Met   Problem: Health Behavior/Discharge Planning: Goal: Ability to manage health-related needs will improve Outcome: Completed/Met   Problem: Clinical Measurements: Goal: Ability to maintain clinical measurements within normal limits will improve Outcome: Completed/Met Goal: Will remain free from infection Outcome: Completed/Met Goal: Diagnostic test results will improve Outcome: Completed/Met Goal: Respiratory complications will improve Outcome: Completed/Met Goal: Cardiovascular complication will be avoided Outcome: Completed/Met   Problem: Activity: Goal: Risk for activity intolerance will decrease Outcome: Completed/Met   Problem: Nutrition: Goal: Adequate nutrition will be maintained Outcome: Completed/Met   Problem: Coping: Goal: Level of anxiety will decrease Outcome: Completed/Met   Problem: Elimination: Goal: Will not experience complications related to bowel motility Outcome: Completed/Met Goal: Will not experience complications related to urinary retention Outcome: Completed/Met   Problem: Pain Managment: Goal: General experience of comfort will improve Outcome: Completed/Met   Problem: Safety: Goal: Ability to remain free from injury will improve Outcome: Completed/Met   Problem: Skin Integrity: Goal: Risk for impaired skin integrity will decrease Outcome: Completed/Met   Problem: Education: Goal: Ability to demonstrate management of disease process will improve Outcome: Completed/Met Goal: Ability to verbalize understanding of medication therapies will improve Outcome: Completed/Met Goal: Individualized Educational Video(s) Outcome: Completed/Met   Problem: Activity: Goal: Capacity to carry out activities will improve Outcome:  Completed/Met   Problem: Cardiac: Goal: Ability to achieve and maintain adequate cardiopulmonary perfusion will improve Outcome: Completed/Met   Problem: Education: Goal: Ability to describe self-care measures that may prevent or decrease complications (Diabetes Survival Skills Education) will improve Outcome: Completed/Met Goal: Individualized Educational Video(s) Outcome: Completed/Met   Problem: Coping: Goal: Ability to adjust to condition or change in health will improve Outcome: Completed/Met   Problem: Fluid Volume: Goal: Ability to maintain a balanced intake and output will improve Outcome: Completed/Met   Problem: Health Behavior/Discharge Planning: Goal: Ability to identify and utilize available resources and services will improve Outcome: Completed/Met Goal: Ability to manage health-related needs will improve Outcome: Completed/Met   Problem: Metabolic: Goal: Ability to maintain appropriate glucose levels will improve Outcome: Completed/Met   Problem: Nutritional: Goal: Maintenance of adequate nutrition will improve Outcome: Completed/Met Goal: Progress toward achieving an optimal weight will improve Outcome: Completed/Met   Problem: Skin Integrity: Goal: Risk for impaired skin integrity will decrease Outcome: Completed/Met   Problem: Tissue Perfusion: Goal: Adequacy of tissue perfusion will improve Outcome: Completed/Met

## 2022-02-01 NOTE — Progress Notes (Signed)
      This pt has been referred to our [facility] program. This is a home-based Palliative care program that is provided by [facility]. We will follow the pt at home after discharge to assist with any chronic/acute symptom management needs. We utilize her PCP as the attending for this program. He will be getting nursing visits 1-3 times a month and SW support in the home with these services. He will also have after hours nursing support as well.    He is eligible for other [facility] services in home with our program in place as well.   Webb Silversmith RN [phone]

## 2022-02-02 ENCOUNTER — Ambulatory Visit: Payer: No Typology Code available for payment source | Admitting: Cardiology

## 2022-02-07 ENCOUNTER — Other Ambulatory Visit: Payer: Self-pay | Admitting: Cardiology

## 2022-02-07 DIAGNOSIS — I5032 Chronic diastolic (congestive) heart failure: Secondary | ICD-10-CM

## 2022-02-08 ENCOUNTER — Encounter: Payer: Self-pay | Admitting: Cardiology

## 2022-02-08 ENCOUNTER — Ambulatory Visit: Payer: No Typology Code available for payment source | Admitting: Cardiology

## 2022-02-08 VITALS — BP 77/51 | HR 67 | Resp 16 | Ht 70.0 in | Wt 189.0 lb

## 2022-02-08 DIAGNOSIS — Z515 Encounter for palliative care: Secondary | ICD-10-CM

## 2022-02-08 DIAGNOSIS — N184 Chronic kidney disease, stage 4 (severe): Secondary | ICD-10-CM

## 2022-02-08 DIAGNOSIS — K7210 Chronic hepatic failure without coma: Secondary | ICD-10-CM

## 2022-02-08 DIAGNOSIS — I4819 Other persistent atrial fibrillation: Secondary | ICD-10-CM

## 2022-02-08 DIAGNOSIS — R188 Other ascites: Secondary | ICD-10-CM

## 2022-02-08 DIAGNOSIS — K7581 Nonalcoholic steatohepatitis (NASH): Secondary | ICD-10-CM

## 2022-02-08 DIAGNOSIS — D638 Anemia in other chronic diseases classified elsewhere: Secondary | ICD-10-CM

## 2022-02-08 MED ORDER — ALPRAZOLAM 0.5 MG PO TABS: 0.5000 mg | ORAL_TABLET | Freq: Two times a day (BID) | ORAL | 0 refills | Status: AC | PRN

## 2022-02-08 NOTE — Progress Notes (Signed)
Primary Physician/Referring:  Christain Sacramento, MD  Patient ID: Dale [name], male    DOB: 22-Jul-1950, 71 y.o.   MRN: [id number]  Chief Complaint  Patient presents with   Congestive Heart Failure   Acute Renal Failure   Ascites   Follow-up    2 weeks    HPI:    Dale [name]  is a 71 y.o.  Caucasian male with moderate obesity, prior tobacco use quit smoking in 2000, diabetes mellitus with  stage IIIb-4 chronic kidney disease, hypertension, hyperlipidemia, chronic diastolic heart failure, chronic dyspnea on exertion, OSA on CPAP, chronic thrombocytopenia, history of atrial fibrillation in 2014, also diagnosed with severe sleep apnea and has been compliant with CPAP.  Patient was admitted to [facility] on 01/20/2022 and discharged on 01/31/2022 with recurrent right pleural effusion and ascites needing multiple send disease.  He also had persistent atrial fibrillation and a complaint of mild diastolic heart failure.  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  Patient in the office today, essentially wheelchair-bound, not been able to do anything at home.  His wife is present.  Patient complains of mild fatigue and weakness.  Past Medical History:  Diagnosis Date   Arthritis    CHF (congestive heart failure) (HCC)    Chronic kidney disease    Diabetes mellitus without complication ([place])    Dysrhythmia    A. Fib   Epigastric hernia    Gout    Hyperlipidemia    Hypertension    Sleep apnea    Tubular adenoma of colon 12/2010   Social History   Tobacco Use   Smoking status: Former    Packs/day: 1.50    Years: 10.00    Total pack years: 15.00    Types: Cigars, Cigarettes    Quit date: 2000    Years since quitting: 23.9   Smokeless tobacco: Former    Types: Chew    Quit date: 2000   Tobacco comments:    quite 1979 cigarettes   Substance Use Topics   Alcohol use: No   Marital Status: Married  ROS  Review of Systems  Constitutional: Positive for malaise/fatigue.  Cardiovascular:  Positive for dyspnea on exertion and leg swelling.   Objective  Blood pressure (!) 77/51, pulse 67, resp. rate 16, height _0  (1.778 m), weight 189 lb (85.7 kg), SpO2 95 %.     02/08/2022    2:24 PM 01/31/2022    9:55 AM 01/31/2022    9:00 AM  Vitals with BMI  Height _1     Weight 189 lbs    BMI 24.09    Systolic 77 735 329  Diastolic 51 67 76  Pulse 67       Physical Exam Constitutional:      Appearance: He is ill-appearing.     Comments: Well built and moderately obese in no acute distress  Neck:     Vascular: JVD present. No carotid bruit.  Cardiovascular:     Rate and Rhythm: Normal rate. Rhythm irregularly irregular.     Heart sounds: Heart sounds are distant. No murmur heard.    No gallop.  Pulmonary:     Effort: Pulmonary effort is normal. No accessory muscle usage.     Breath sounds: Normal breath sounds.  Abdominal:     General: Bowel sounds are normal. There is distension.     Palpations:  Abdomen is soft. There is shifting dullness.     Comments: Obese abdomen  Musculoskeletal:     Right lower leg: Edema (2+ pitting) present.     Left lower leg: Edema (2+ pitting) present.  Neurological:     Mental Status: He is lethargic.    Laboratory examination:   Lab Results  Component Value Date   NA 133 (L) 01/31/2022   K 4.1 01/31/2022   CO2 25 01/31/2022   GLUCOSE 161 (H) 01/31/2022   BUN 89 (H) 01/31/2022   CREATININE 2.53 (H) 01/31/2022   CALCIUM 9.0 01/31/2022   EGFR 29 (L) 10/31/2020   GFRNONAA 26 (L) 01/31/2022       Latest Ref Rng & Units 01/31/2022   12:47 AM 01/30/2022   12:46 AM 01/29/2022    1:03 AM  CMP  Glucose 70 - 99 mg/dL 161  150  159   BUN 8 - 23 mg/dL 89  85  83   Creatinine 0.61 - 1.24 mg/dL 2.53  2.36  2.22   Sodium 135 - 145 mmol/L 133  133  134   Potassium 3.5 -  5.1 mmol/L 4.1  3.9  3.1   Chloride 98 - 111 mmol/L 94  94  93   CO2 22 - 32 mmol/L _0 Calcium 8.9 - 10.3 mg/dL 9.0  9.0  9.2       Latest Ref Rng & Units 01/27/2022    1:07 AM 01/26/2022    1:33 AM 01/25/2022    1:10 AM  CBC  WBC 4.0 - 10.5 K/uL 7.9  6.4  6.2   Hemoglobin 13.0 - 17.0 g/dL 12.2  11.8  11.4   Hematocrit 39.0 - 52.0 % 34.1  33.0  33.0   Platelets 150 - 400 K/uL 36  37  34     HEMOGLOBIN A1C Lab Results  Component Value Date   HGBA1C 6.4 (H) 01/20/2022   MPG 137 01/20/2022    External labs:  Labs 01/08/2022:  Sodium 129, potassium 3.6, BUN 55, creatinine 1.86, EGFR 38 mL.  Hb 10.9/HCT 31.3, platelets 79.  Microcytic indicis.  TSH 0.5-0, A1c 5.7%, serum ferritin, iron levels with TIBC and saturation, transferrin levels were normal.  NT proBNP 549.  Pleural aspirate analysis: Glucose 123, lactate dehydrogenase 105, pH 7.5, protein <2 g/dL, 25% neutrophils, 68% lymphocytes, absolute WBC count 143.  Pathology reveals atypical cells, nonspecific.  Indeterminate for reactive versus neoplastic process.  Acute hepatitis panel including for hepatitis A and B-  -Ve  Iron 49 - 181 mcg/dL       127  TOTAL IRON BINDING CAPACITY 261 - 462 mcg/dL  330  Iron Saturation %       38.5   Labs 08/04/2021:  Total cholesterol 74, triglycerides 119, HDL 22, LDL 26.  Non-HDL cholesterol 52.  Medications and allergies   Allergies  Allergen Reactions   Nsaids Other (See Comments)    NOT to take these due to kidney function   Allopurinol Other (See Comments)    Foot pain, not a rash., 19 Mar 2010    Medications after today's encounter   Current Outpatient Medications:    ACCU-CHEK GUIDE test strip, , Disp: , Rfl:    ALPRAZolam (XANAX) 0.5 MG tablet, Take 1 tablet (0.5 mg total) by mouth 2 (two) times daily as needed for anxiety., Disp: 60 tablet, Rfl: 0   digoxin 62.5 MCG TABS, Take 0.0625 mg by mouth every other day., Disp:  30 tablet, Rfl: 1   fenofibrate  (TRICOR) 145 MG tablet, Take 145 mg by mouth daily., Disp: , Rfl:    torsemide 40 MG TABS, Take 40 mg by mouth daily., Disp: 30 tablet, Rfl: 2  Radiology:   Complete ultrasound of the abdomen 12/30/2021: Small volume ascites. The liver measures 15.3 cm. There is irregularity along the liver margins along with coarse heterogeneous echotexture all consistent with cirrhosis. There is no solid mass. Doppler evaluation shows normal directional flow in the portal vein, hepatic artery, hepatic veins. The inferior vena cava is not seen. There is no ductal dilatation with common bile duct measuring 4 mm.  The gallbladder contains multiple small stones. The wall measures 7.3 mm however this could be due to the adjacent ascites. There is no reported tenderness.  The right kidney measures 10.5 x 5.4 x 5.0 cm and the left 10.8 x 6.8 x 6.3 cm. No stones, obstruction or masses. Doppler evaluation shows flow within the kidneys.  The spleen is grossly enlarged measuring 17.0 x 7.5 x 17.1 cm. No mass.   CT chest without contrast 12/30/2021: Large right pleural effusion with adjacent opacities that may all  reflect pneumonia and/or atelectasis. Central obstructing mass not excluded, follow-up to radiographic resolution recommended following treatment. Contrast enhanced CT, bronchoscopy, or pleural fluid sampling could also be considered.   Cardiac Studies:   Carotid artery duplex  11/21/2018: Minimal stenosis in the right internal carotid artery (minimal).  Stenosis in the right external carotid artery (<50%). Minimal stenosis in the left internal carotid artery (minimal).  Stenosis in the left external carotid artery (<50%). Antegrade right vertebral artery flow. Antegrade left vertebral artery flow. Bilateral external carotid stenosis is the source of bruit.  Follow up studies when clinically indicated.  PCV ECHOCARDIOGRAM COMPLETE 11/04/2020  Narrative Echocardiogram 11/04/2020: Left ventricle cavity is  normal in size and wall thickness. Normal global wall motion. Normal LV systolic function with EF 59%. Normal diastolic filling pattern. Mild (Grade I) aortic regurgitation. Mild (Grade I) mitral regurgitation. Previous study on 08/15/2020 reported EF 45-50%, mod MR, mild TR.   PCV MYOCARDIAL PERFUSION WITH LEXISCAN 11/03/2020 Lexiscan nuclear stress test performed using 1-day protocol. Small area of mildly decreased tracer uptake in apical inferior myocardium, likely due to gut attenuation. All segments of left ventricle demonstrated normal wall motion and thickening. Stress LVEF 81%. Low risk study.  Echocardiogram 01/03/2022: Normal LV size, mild LVH, normal wall motion.  EF 60 to 65%. Normal RV size and function. Mild mitral and calcification.  Tricuspid valve is normal.  No pulm hypertension.   EKG  EKG 01/19/2022: Atrial fibrillation with controlled ventricular response at a rate of 93 bpm, left axis deviation, left anterior fascicular block.  Poor R wave progression, cannot exclude anteroseptal infarct old.  IVCD, incomplete left bundle branch block.  Low voltage complexes, consider pulmonary disease pattern.  Compared to 11/19/2021, atrial fibrillation has replaced sinus rhythm.  Assessment     ICD-10-CM   1. End stage liver disease ([place])  K72.10 Ambulatory referral to Hospice    2. Cirrhosis of liver with ascites, unspecified hepatic cirrhosis type ([place])  K74.60 Ambulatory referral to Hospice   R18.8     3. Steatohepatitis, non-alcoholic  X51.70     4. CKD (chronic kidney disease) stage 4, GFR 15-29 ml/min (HCC)  N18.4 Ambulatory referral to Hospice    5. Hospice care patient  Z51.5 ALPRAZolam (XANAX) 0.5 MG tablet    6. Persistent atrial fibrillation (HCC)  I48.19  7. Anemia of chronic disease  D63.8     CHA2DS2-VASc Score is 4.  Yearly risk of stroke: 5% (A, HTN, DM, CHF).  Score of 1=0.6; 2=2.2; 3=3.2; 4=4.8; 5=7.2; 6=9.8; 7=>9.8) -(CHF; HTN; vasc disease DM,   Male = 1; Age <65 =0; 65-74 = 1,  >75 =2; stroke/embolism= 2).    Medications Discontinued During This Encounter  Medication Reason   cephALEXin (KEFLEX) 500 MG capsule Completed Course   atorvastatin (LIPITOR) 80 MG tablet Discontinued by provider   diltiazem (CARDIZEM CD) 120 MG 24 hr capsule Discontinued by provider   cholecalciferol (VITAMIN D3) 25 MCG (1000 UNIT) tablet Discontinued by provider   glipiZIDE (GLUCOTROL XL) 2.5 MG 24 hr tablet Discontinued by provider   Lancets (ONETOUCH ULTRASOFT) lancets Discontinued by provider   metoprolol tartrate (LOPRESSOR) 25 MG tablet Discontinued by provider    Meds ordered this encounter  Medications   ALPRAZolam (XANAX) 0.5 MG tablet    Sig: Take 1 tablet (0.5 mg total) by mouth 2 (two) times daily as needed for anxiety.    Dispense:  60 tablet    Refill:  0   Recommendations:   Dale [name]  is a 71 y.o.  Caucasian male with moderate obesity, prior tobacco use quit smoking in 2000, diabetes mellitus with  stage IIIb to IV chronic kidney disease, hypertension, hyperlipidemia, chronic diastolic heart failure, chronic dyspnea on exertion, OSA on CPAP, chronic thrombocytopenia, history of atrial fibrillation in 2014, also diagnosed with severe sleep apnea and has been compliant with CPAP.  Patient was admitted to [facility] on 01/20/2022 and discharged on 01/31/2022 with recurrent right pleural effusion and ascites needing multiple send disease.  He also had persistent atrial fibrillation and a complaint of mild diastolic heart failure.  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  Patient extremely ill, auto anticoagulated due to end-stage liver disease, severe hypotension, today his systolic blood pressure was 77 mmHg, patient's family and patient himself accepted that it is irreversible.  I have  discontinued all the medications and made a urgent referral for hospice care.  I do not think he will survive for more than a few weeks.  Will discontinue all the medications except diuretics.  He has recurrence of his right pleural effusion, abdomen is soft but suspect he is again accumulating ascites as well.  I spent 40 minutes with the patient and her family.  I consoled them.  I have made his visits as needed visits.  I have advised them to cancel all the physical therapy and home health including nurse visits as patient has reached end-stage.    Adrian Prows, MD, [place] 02/08/2022, 3:09 PM Office: [phone] Fax: [phone] Pager: [phone]

## 2022-02-09 ENCOUNTER — Telehealth: Payer: Self-pay | Admitting: Cardiology

## 2022-02-09 NOTE — Telephone Encounter (Signed)
That should be fine. 

## 2022-02-09 NOTE — Telephone Encounter (Signed)
Kristen with [facility] called about a referral that was put in for this patient. She says the family has elected to go with [facility] instead, so she is closing out the referral to [facility]. Please advise.

## 2022-02-19 ENCOUNTER — Ambulatory Visit: Payer: No Typology Code available for payment source | Admitting: Family Medicine

## 2022-02-22 DEATH — deceased

## 2022-03-01 ENCOUNTER — Ambulatory Visit: Payer: No Typology Code available for payment source | Admitting: Gastroenterology

## 2022-05-20 ENCOUNTER — Ambulatory Visit: Payer: No Typology Code available for payment source | Admitting: Cardiology

## 2022-07-04 IMAGING — US US RENAL
1 series · 14 of 25 positions shown · non-contrast
Comparison: CT abdomen and pelvis 07/13/2010.

CLINICAL DATA: Acute kidney injury.

EXAM:
RENAL / URINARY TRACT ULTRASOUND COMPLETE

[Series 1: us renal · 0.26mm/px · 14 of 48 slices shown]
[im 1/48]
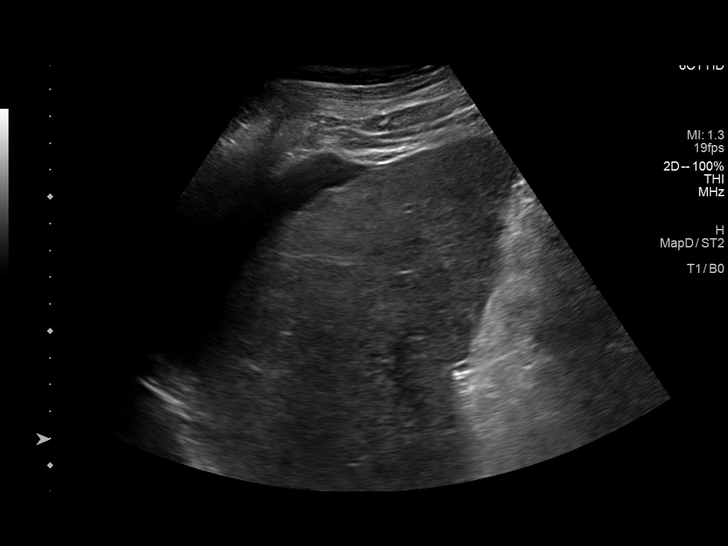
[im 4/48]
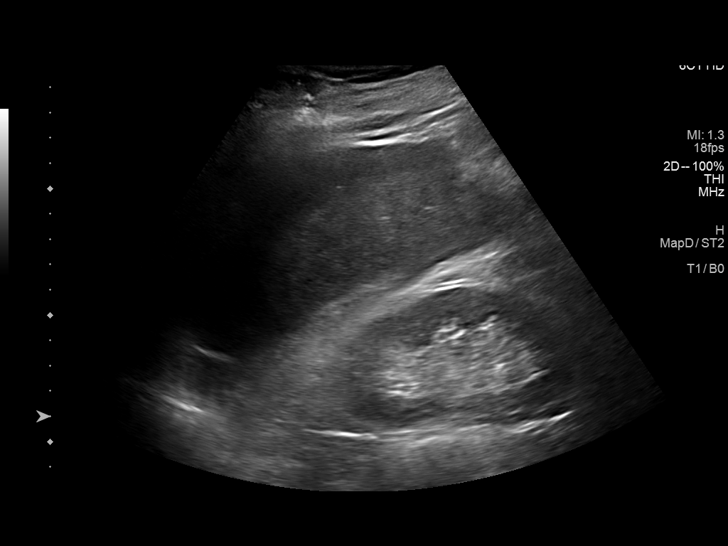
[im 8/48]
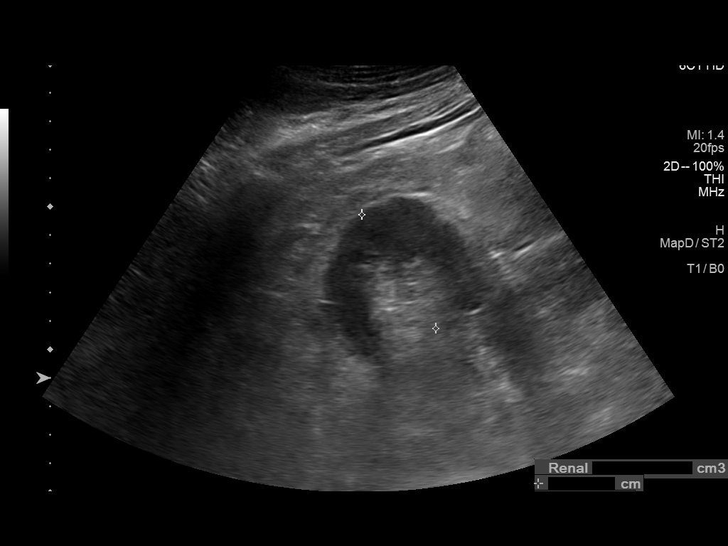
[im 12/48]
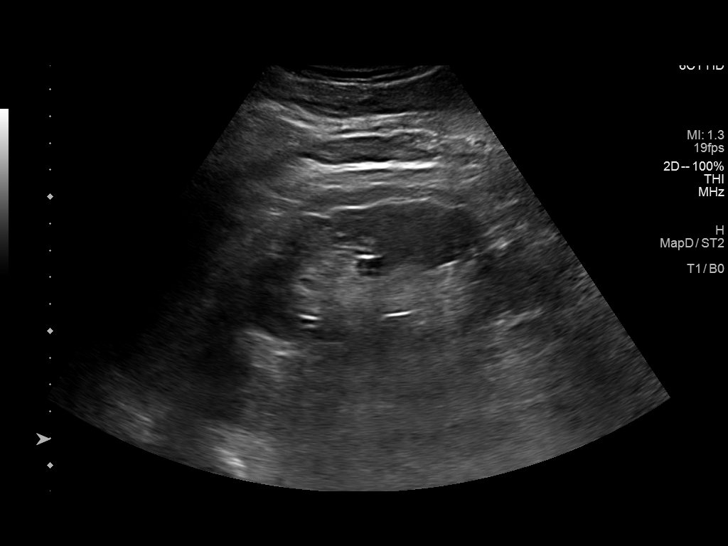
[im 16/48]
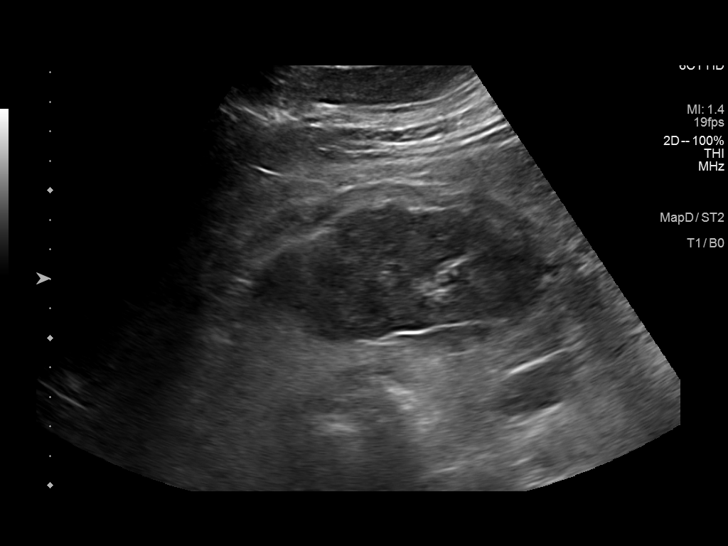
[im 18/48]
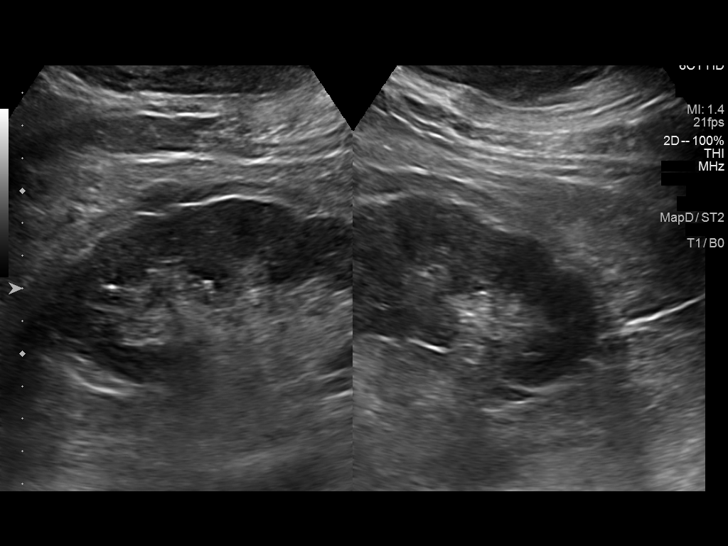
[im 22/48]
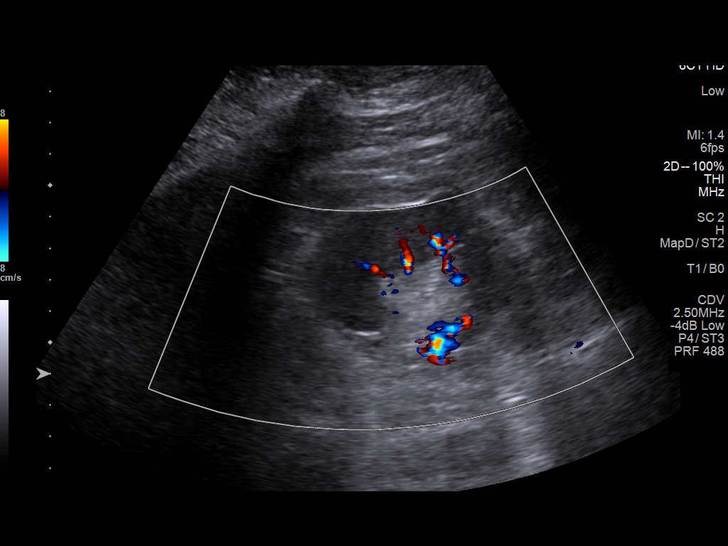
[im 26/48]
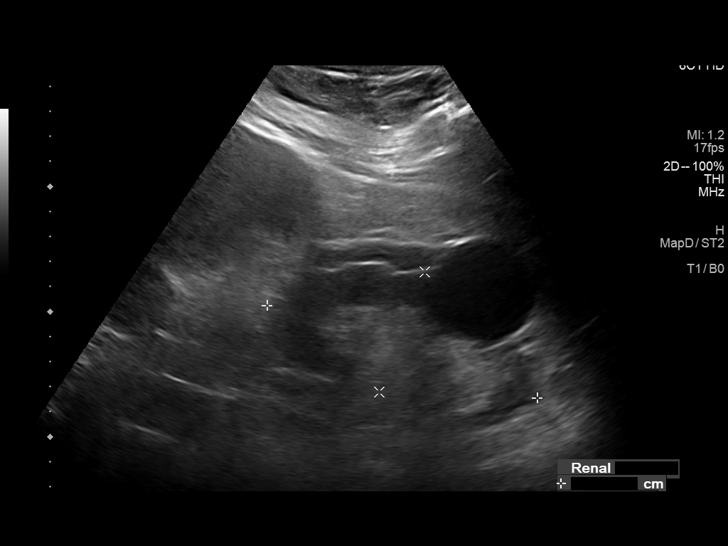
[im 30/48]
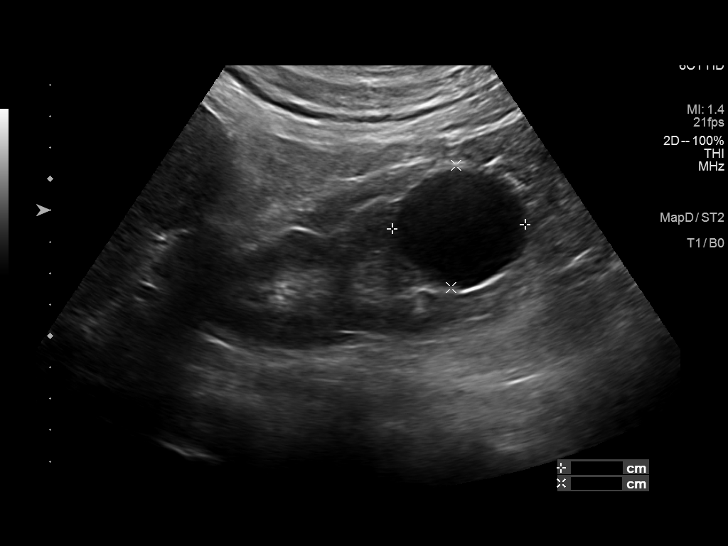
[im 32/48]
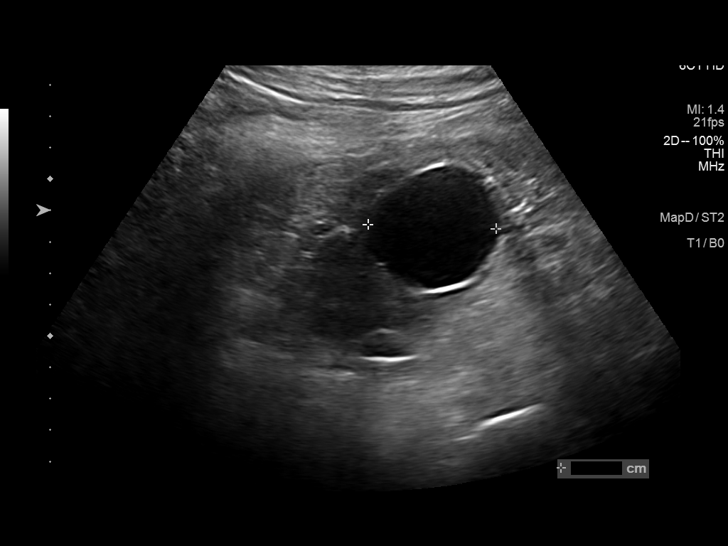
[im 36/48]
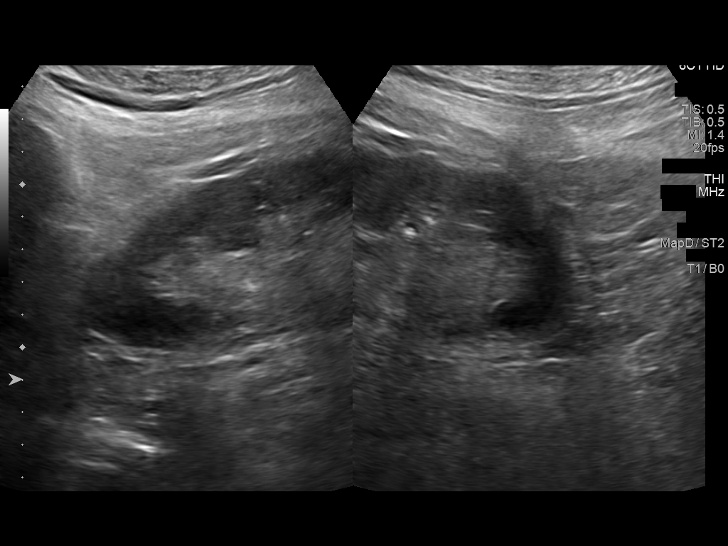
[im 40/48]
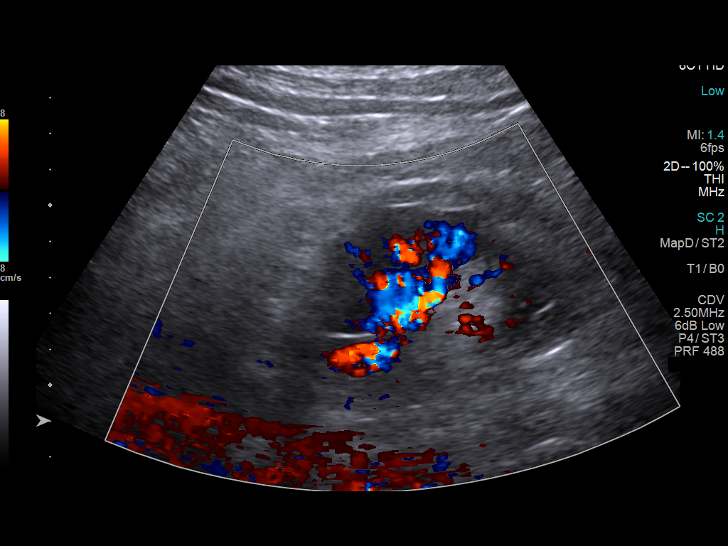
[im 44/48]
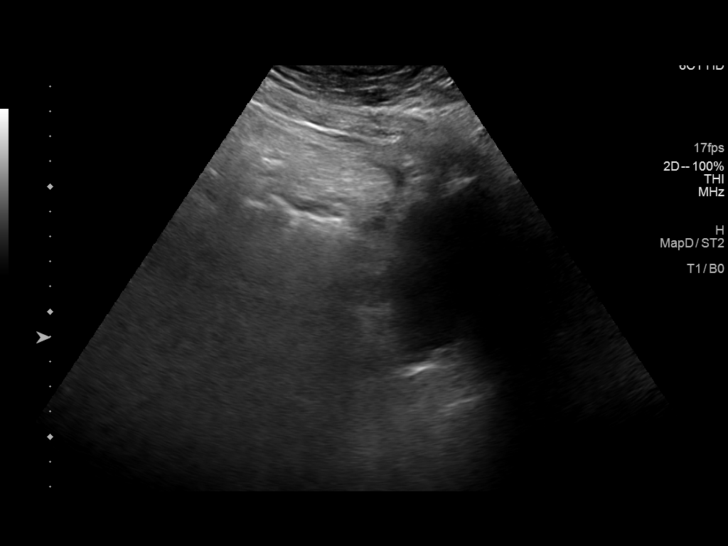
[im 48/48]
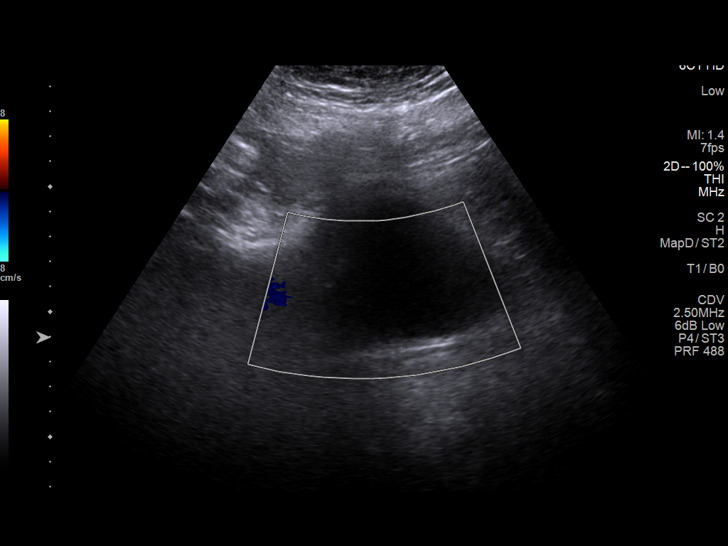

[14 of 25 positions shown; findings below may reference images not displayed]

FINDINGS: Right Kidney:

Renal measurements: 11.2 x 5.2 x 4.8 cm = volume: 146 mL.
Echogenicity within normal limits. There is no hydronephrosis. There
is a 1.1 x 0.7 x 1.0 cm cyst in the mid right kidney.

Left Kidney:

Renal measurements: 11.4 x 5.1 x 5.2 cm = volume: 158 mL.
Echogenicity within normal limits. There is no hydronephrosis. There
is a 4.2 x 3.9 x 4.1 cm cyst in the inferior pole the left kidney.

Bladder:

Appears normal for degree of bladder distention.

Other:

Small amount of free fluid is seen in the right upper quadrant and
pelvis.
IMPRESSION: 1. No hydronephrosis.
2. Renal cysts.
3. Small amount of ascites.
